# Patient Record
Sex: Female | Born: 1935 | Race: White | Hispanic: No | Marital: Married | Smoking: Former smoker
Health system: Southern US, Community
[De-identification: ages and names within clinical notes are randomized; demographics above are authoritative.]

## PROBLEM LIST (undated history)

## (undated) DIAGNOSIS — Z9289 Personal history of other medical treatment: Secondary | ICD-10-CM

## (undated) DIAGNOSIS — R51 Headache: Secondary | ICD-10-CM

## (undated) DIAGNOSIS — E785 Hyperlipidemia, unspecified: Secondary | ICD-10-CM

## (undated) DIAGNOSIS — I493 Ventricular premature depolarization: Secondary | ICD-10-CM

## (undated) DIAGNOSIS — I2510 Atherosclerotic heart disease of native coronary artery without angina pectoris: Secondary | ICD-10-CM

## (undated) DIAGNOSIS — K219 Gastro-esophageal reflux disease without esophagitis: Secondary | ICD-10-CM

## (undated) DIAGNOSIS — I739 Peripheral vascular disease, unspecified: Secondary | ICD-10-CM

## (undated) DIAGNOSIS — N189 Chronic kidney disease, unspecified: Secondary | ICD-10-CM

## (undated) DIAGNOSIS — R519 Headache, unspecified: Secondary | ICD-10-CM

## (undated) DIAGNOSIS — F0781 Postconcussional syndrome: Secondary | ICD-10-CM

## (undated) DIAGNOSIS — I10 Essential (primary) hypertension: Secondary | ICD-10-CM

## (undated) HISTORY — PX: ROTATOR CUFF REPAIR: SHX139

## (undated) HISTORY — DX: Ventricular premature depolarization: I49.3

## (undated) HISTORY — DX: Postconcussional syndrome: F07.81

## (undated) HISTORY — PX: BREAST CYST EXCISION: SHX579

## (undated) HISTORY — DX: Atherosclerotic heart disease of native coronary artery without angina pectoris: I25.10

## (undated) HISTORY — DX: Hyperlipidemia, unspecified: E78.5

## (undated) HISTORY — DX: Gastro-esophageal reflux disease without esophagitis: K21.9

## (undated) HISTORY — DX: Essential (primary) hypertension: I10

## (undated) HISTORY — DX: Peripheral vascular disease, unspecified: I73.9

---

## 1972-06-29 HISTORY — PX: TUBAL LIGATION: SHX77

## 1997-09-27 ENCOUNTER — Encounter: Admission: RE | Admit: 1997-09-27 | Discharge: 1997-12-26 | Payer: Self-pay | Admitting: Orthopedic Surgery

## 1999-11-10 ENCOUNTER — Other Ambulatory Visit: Admission: RE | Admit: 1999-11-10 | Discharge: 1999-11-10 | Payer: Self-pay | Admitting: Family Medicine

## 2000-11-15 ENCOUNTER — Other Ambulatory Visit: Admission: RE | Admit: 2000-11-15 | Discharge: 2000-11-15 | Payer: Self-pay | Admitting: Family Medicine

## 2001-06-29 HISTORY — PX: GALLBLADDER SURGERY: SHX652

## 2004-04-18 ENCOUNTER — Other Ambulatory Visit: Admission: RE | Admit: 2004-04-18 | Discharge: 2004-04-18 | Payer: Self-pay | Admitting: Family Medicine

## 2006-01-05 ENCOUNTER — Other Ambulatory Visit: Admission: RE | Admit: 2006-01-05 | Discharge: 2006-01-05 | Payer: Self-pay | Admitting: Family Medicine

## 2007-09-27 ENCOUNTER — Encounter: Admission: RE | Admit: 2007-09-27 | Discharge: 2007-11-15 | Payer: Self-pay | Admitting: Family Medicine

## 2008-03-09 ENCOUNTER — Encounter: Payer: Self-pay | Admitting: Internal Medicine

## 2008-12-11 ENCOUNTER — Encounter: Payer: Self-pay | Admitting: Cardiology

## 2009-09-17 ENCOUNTER — Encounter: Payer: Self-pay | Admitting: Cardiology

## 2009-10-22 ENCOUNTER — Encounter: Payer: Self-pay | Admitting: Cardiology

## 2009-10-30 DIAGNOSIS — K219 Gastro-esophageal reflux disease without esophagitis: Secondary | ICD-10-CM | POA: Insufficient documentation

## 2009-10-30 DIAGNOSIS — R072 Precordial pain: Secondary | ICD-10-CM

## 2009-10-31 ENCOUNTER — Ambulatory Visit: Payer: Self-pay | Admitting: Cardiology

## 2009-10-31 ENCOUNTER — Encounter (INDEPENDENT_AMBULATORY_CARE_PROVIDER_SITE_OTHER): Payer: Self-pay | Admitting: *Deleted

## 2009-11-07 ENCOUNTER — Ambulatory Visit: Payer: Self-pay | Admitting: Cardiology

## 2009-11-08 ENCOUNTER — Telehealth (INDEPENDENT_AMBULATORY_CARE_PROVIDER_SITE_OTHER): Payer: Self-pay | Admitting: *Deleted

## 2010-06-20 ENCOUNTER — Encounter
Admission: RE | Admit: 2010-06-20 | Discharge: 2010-06-20 | Payer: Self-pay | Source: Home / Self Care | Attending: Diagnostic Neuroimaging | Admitting: Diagnostic Neuroimaging

## 2010-07-31 NOTE — Assessment & Plan Note (Signed)
Summary: NP-CHEST PAIN   Visit Type:  Initial Consult Primary Provider:  Drue Dun   CC:  chest pain.  History of Present Illness: the patient is a pleasant 75 year old female referred by Dr. Neita Carp for evaluation of chest pain. The patient reports a episode of prolonged substernal chest pain which lasted roughly 10 hours Saturday before Easter. This occurred after she ate a cook ham sandwich with potatoe chips. She experienced a cramping sensation with some radiation between the shoulder blades. There was however no radiation into the neck shoulder or left arm. She experienced the pain as a cramp, definitely not chest pressure. She felt associated nausea no shortness of breath or diaphoresis. The patient has a long-standing history of GERD and notices that when she does not take her Nexium for a few days she will have significant reflux. She has had similar episodes in the past but has taken her to the emergency room. she has had several stress tests the last stress test approximately 2 years ago. She has also been ruled out in the past for microinfarcts with negative enzymes. The EKG that has been forwarded by Dr. Dian Situ office shows no prior infarct patterns or ischemia. Laboratory work also is essentially in normal limits although the patient has hypercholesterolemia and on treatment LDL is 121 with a total cholesterol 222. Liver function tests are within normal limits.  The patient denies any exertional chest pain shortness of breath orthopnea or PND.  Preventive Screening-Counseling & Management  Alcohol-Tobacco     Smoking Status: quit     Year Quit: 1974  Current Problems (verified): 1)  Essential Hypertension  (ICD-401.9) 2)  Gerd  (ICD-530.81) 3)  Precordial Pain  (ICD-786.51)  Current Medications (verified): 1)  Nexium 40 Mg Cpdr (Esomeprazole Magnesium) .... Take 1 Tablet By Mouth Once A Day 2)  Benicar Hct 40-12.5 Mg Tabs (Olmesartan Medoxomil-Hctz) .... Take 1/2 Tablet  By Mouth Once A Day 3)  Zetia 10 Mg Tabs (Ezetimibe) .... Take 1 Tablet By Mouth Once A Day 4)  Lipitor 20 Mg Tabs (Atorvastatin Calcium) .... Take 1 Tablet By Mouth Once A Day 5)  Citracal/vitamin D 250-200 Mg-Unit Tabs (Calcium Citrate-Vitamin D) .... Take 2 Tablet By Mouth Once A Day 6)  Klor-Con 10 10 Meq Cr-Tabs (Potassium Chloride) .... Take One By Mouth Every Other Day 7)  Doxepin Hcl 10 Mg Caps (Doxepin Hcl) .... Take 1 Tablet By Mouth Once A Day At Bedtime As Needed 8)  Ibuprofen 800 Mg Tabs (Ibuprofen) .... Take 1 Tablet By Mouth Two Times A Day As Needed 9)  Centrum Silver  Tabs (Multiple Vitamins-Minerals) .... Take 1 Tablet By Mouth Once A Day 10)  Travatan Z 0.004 % Soln (Travoprost) .... One Drop Ou At Bedtime  Allergies: 1)  ! Pcn 2)  ! Cephalosporins 3)  ! Morphine  Comments:  Nurse/Medical Assistant: The patient's medications and allergies were reviewed with the patient and were updated in the Medication and Allergy Lists. List reviewed.  Past History:  Past Medical History: Last updated: 10/30/2009 CHEST PAINS G E R D  Family History: Reviewed history and no changes required. history of coronary disease in father and mother  Social History: Reviewed history and no changes required. patient is a former Char[employer] fundraiser.she quit smoking in 1974Smoking Status:  quit  Review of Systems       The patient complains of chest pain.  The patient denies fatigue, malaise, fever, weight gain/loss, vision loss, decreased hearing, hoarseness, palpitations, shortness of  breath, prolonged cough, wheezing, sleep apnea, coughing up blood, abdominal pain, blood in stool, nausea, vomiting, diarrhea, heartburn, incontinence, blood in urine, muscle weakness, joint pain, leg swelling, rash, skin lesions, headache, fainting, dizziness, depression, anxiety, enlarged lymph nodes, easy bruising or bleeding, and environmental allergies.    Vital Signs:  Patient profile:   75 year old  female Height:      63.5 inches Weight:      125 pounds BMI:     21.87 Pulse rate:   82 / minute BP sitting:   145 / 72  (left arm) Cuff size:   regular  Vitals Entered By: Carlye Grippe (Oct 31, 2009 1:20 PM) CC: chest pain   Physical Exam  Additional Exam:  General: Well-developed, well-nourished in no distress head: Normocephalic and atraumatic eyes PERRLA/EOMI intact, conjunctiva and lids normal nose: No deformity or lesions mouth normal dentition, normal posterior pharynx neck: Supple, no JVD.  No masses, thyromegaly or abnormal cervical nodes lungs: Normal breath sounds bilaterally without wheezing.  Normal percussion heart: regular rate and rhythm with normal S1 and S2, no S3 or S4.  PMI is normal.  No pathological murmurs abdomen: Normal bowel sounds, abdomen is soft and nontender without masses, organomegaly or hernias noted.  No hepatosplenomegaly musculoskeletal: Back normal, normal gait muscle strength and tone normal pulsus: Pulse is normal in all 4 extremities Extremities: No peripheral pitting edema neurologic: Alert and oriented x 3 skin: Intact without lesions or rashes cervical nodes: No significant adenopathy psychologic: Normal affect    Impression & Recommendations:  Problem # 1:  GERD (ICD-530.81) the patient needs to be compliant with her antireflux measures and should continue to take Nexium on a daily basis. I suspect has esophageal spasm but in the long term could be treated with calcium channel blocker and her antihypertensive medical regimen can be further simplified Her updated medication list for this problem includes:    Nexium 40 Mg Cpdr (Esomeprazole magnesium) .Marland Kitchen... Take 1 tablet by mouth once a day  Problem # 2:  PRECORDIAL PAIN (ICD-786.51)  the patient's chest pain was very prolonged and does not appear to be consistent with angina. She has a long-standing history of reflux disease and I suspect she has esophageal spasm. I told her that  she can take p.r.n. nitroglycerin if this occurs. Her episodes are infrequent enough that she does not need a long acting calcium channel blocker for right now this can be considered in the future however. Although the patient's pretest probability for coronary artery disease is low, I have recommended a stress echocardiogram. If this is within normal limits I do not think any further cardiac workup is warranted.  Orders: Echo- Stress (Stress Echo)  Problem # 3:  ESSENTIAL HYPERTENSION (ICD-401.9) Controlled Her updated medication list for this problem includes:    Benicar Hct 40-12.5 Mg Tabs (Olmesartan medoxomil-hctz) .Marland Kitchen... Take 1/2 tablet by mouth once a day  Patient Instructions: 1)  stress echo  2)  Follow up in  1 month - may cancell if test is okay.

## 2010-07-31 NOTE — Progress Notes (Signed)
Summary: PHONE: TEST RESULTS  Phone Note Call from Patient Call back at Home Phone [phone]   Caller: SELF-CELL # [phone] Reason for Call: Lab or Test Results Summary of Call: Called wanting to know if we have test results of stress echo done 5-12 please call her cell # [phone]  Initial call taken by: Zachary George,  Nov 08, 2009 2:21 PM  Follow-up for Phone Call        11-11-2009  Heidi Wallace called again wanting to know if Dr. Andee Lineman has read stress test. please call [phone] and if no answer please leave a messge. Follow-up by: Zachary George,  Nov 11, 2009 11:16 AM  Additional Follow-up for Phone Call Additional follow up Details #1::        Notified pt. that test is on MD desktop unsigned.  He wiil be back in office on 5/19.  Advised we would call back with results as soon as available.  Patient verbalized understanding.  Hoover Brunette, LPN  Nov 12, 2009 8:56 AM

## 2010-07-31 NOTE — Letter (Signed)
Summary: External Correspondence/ OFFICE NOTE [facility]  External Correspondence/ OFFICE NOTE [facility]   Imported By: Dorise Hiss 10/24/2009 14:52:01  _____________________________________________________________________  External Attachment:    Type:   Image     Comment:   External Document

## 2010-07-31 NOTE — Letter (Signed)
Summary: Stress Echocardiography  [facility] [address]   [place], [place] [address]   Phone: [phone]  Fax: [phone]      [facility] Cardiovascular Services  Stress Echocardiography    Heidi Wallace  Appointment Date:_  Appointment Time:_   Your doctor has ordered a stress echo to help determine the condition of your heart during exercise. If you take blood pressure medication, ask your doctor if you should take it the day of your test. You should not have anything to eat or drink at least 4 hours before your test is scheduled.  You will be asked to undress from the waist up and given a hospital gown to wear, so dress comfortably from the waist down for example: Sweat pants, shorts, or skirt Rubber soled lace up shoes (tennis shoes)  You will need to register at the Outpatient/Main Entrance at the hospital 15 minutes before your appointment time. It is a good idea to bring a copy of your order with you. They will direct you to the Cardiovascular Department on the third floor.   Plan on about an hour and a half  from registration to release from the hospital

## 2010-07-31 NOTE — Letter (Signed)
Summary: Internal Other/ PATIENT HISTORY FORM  Internal Other/ PATIENT HISTORY FORM   Imported By: Dorise Hiss 11/01/2009 16:49:30  _____________________________________________________________________  External Attachment:    Type:   Image     Comment:   External Document

## 2010-10-29 ENCOUNTER — Encounter: Payer: Self-pay | Admitting: Critical Care Medicine

## 2010-10-30 ENCOUNTER — Ambulatory Visit (HOSPITAL_BASED_OUTPATIENT_CLINIC_OR_DEPARTMENT_OTHER)
Admission: RE | Admit: 2010-10-30 | Discharge: 2010-10-30 | Disposition: A | Discharge status: Home / Self Care | Payer: Medicare Other | Source: Ambulatory Visit | Attending: Critical Care Medicine | Admitting: Critical Care Medicine

## 2010-10-30 ENCOUNTER — Encounter: Payer: Self-pay | Admitting: Critical Care Medicine

## 2010-10-30 ENCOUNTER — Ambulatory Visit (INDEPENDENT_AMBULATORY_CARE_PROVIDER_SITE_OTHER): Payer: Medicare Other | Admitting: Critical Care Medicine

## 2010-10-30 DIAGNOSIS — E782 Mixed hyperlipidemia: Secondary | ICD-10-CM | POA: Insufficient documentation

## 2010-10-30 DIAGNOSIS — J984 Other disorders of lung: Secondary | ICD-10-CM

## 2010-10-30 DIAGNOSIS — F0781 Postconcussional syndrome: Secondary | ICD-10-CM

## 2010-10-30 DIAGNOSIS — R911 Solitary pulmonary nodule: Secondary | ICD-10-CM | POA: Insufficient documentation

## 2010-10-30 DIAGNOSIS — E785 Hyperlipidemia, unspecified: Secondary | ICD-10-CM | POA: Insufficient documentation

## 2010-10-30 DIAGNOSIS — I2510 Atherosclerotic heart disease of native coronary artery without angina pectoris: Secondary | ICD-10-CM

## 2010-10-30 DIAGNOSIS — I10 Essential (primary) hypertension: Secondary | ICD-10-CM | POA: Insufficient documentation

## 2010-10-30 DIAGNOSIS — K219 Gastro-esophageal reflux disease without esophagitis: Secondary | ICD-10-CM | POA: Insufficient documentation

## 2010-10-30 DIAGNOSIS — I700 Atherosclerosis of aorta: Secondary | ICD-10-CM

## 2010-10-30 NOTE — Progress Notes (Signed)
Subjective:    Patient ID: Heidi Wallace, female    DOB: [birth year], 75 y.o.   MRN: [id number]  HPI 75 y.o.WF  Referral for RML nodule 5mm new. Adhesions around navel from GB (2003) surgery and tubal ligation (1970s).  CT Abdomen was done and RML nodule seen on scan and is new from 2009.   No real dyspnea.  Pt does have dry cough, ? Reflux.  Always a tickle in the throat.  IF hold head  Up will go away. This is chronic for 32yrs.   Smoked 15yrs 1PPD stopped in 1974.   No hx of pneumonia except congested ?pna 1/12.  No xray done then .   Has GERD symptoms.    Past Medical History  Diagnosis Date  . Chest pain   . GERD (gastroesophageal reflux disease)   . Hypertension   . Hyperlipidemia   . Post concussion syndrome      Family History  Problem Relation Age of Onset  . Heart disease Father   . Heart disease Mother      History   Social History  . Marital Status: Married    Spouse Name: N/A    Number of Children: N/A  . Years of Education: N/A   Occupational History  . former Charity fundraiser    Social History Main Topics  . Smoking status: Former Smoker -- 1.0 packs/day for 16 years    Types: Cigarettes    Quit date: 06/29/1972  . Smokeless tobacco: Never Used  . Alcohol Use: Not on file  . Drug Use: Not on file  . Sexually Active: Not on file   Other Topics Concern  . Not on file   Social History Narrative  . No narrative on file     Allergies  Allergen Reactions  . Cephalosporins     REACTION: rash  . Morphine     REACTION: mother was,so pt ont take it  . Penicillins     REACTION: rash     Outpatient Prescriptions Prior to Visit  Medication Sig Dispense Refill  . atorvastatin (LIPITOR) 20 MG tablet Take 5 mg by mouth every other day.       . Calcium Citrate-Vitamin D (CITRACAL/VITAMIN D) 250-200 MG-UNIT TABS Take 2 tablets by mouth daily.        Marland Kitchen esomeprazole (NEXIUM) 40 MG capsule Take 40 mg by mouth daily before breakfast.        . ezetimibe (ZETIA) 10 MG  tablet Take 10 mg by mouth every other day.       . ibuprofen (ADVIL,MOTRIN) 800 MG tablet Take 800 mg by mouth every 8 (eight) hours as needed.        . Multiple Vitamins-Minerals (CENTRUM SILVER PO) Take 1 tablet by mouth daily.        Marland Kitchen olmesartan-hydrochlorothiazide (BENICAR HCT) 40-12.5 MG per tablet Take 0.5 tablets by mouth daily.        . potassium chloride (KLOR-CON) 10 MEQ CR tablet Take 10 mEq by mouth every other day.       . travoprost, benzalkonium, (TRAVATAN Z) 0.004 % ophthalmic solution Place 1 drop into both eyes at bedtime.        Marland Kitchen doxepin (SINEQUAN) 10 MG capsule Take 10 mg by mouth at bedtime.           Review of Systems  Constitutional: Negative for fever, chills, diaphoresis, activity change, appetite change, fatigue and unexpected weight change.  HENT: Negative for hearing loss, ear pain, nosebleeds,  congestion, sore throat, facial swelling, sneezing, mouth sores, trouble swallowing, neck pain, neck stiffness, dental problem, voice change, postnasal drip, sinus pressure, tinnitus and ear discharge.   Eyes: Negative for photophobia, discharge, itching and visual disturbance.  Respiratory: Negative for apnea, choking, chest tightness, shortness of breath, wheezing and stridor.   Cardiovascular: Negative for chest pain, palpitations and leg swelling.  Gastrointestinal: Negative for nausea, vomiting, abdominal pain, constipation, blood in stool and abdominal distention.  Genitourinary: Negative for dysuria, urgency, frequency, hematuria, flank pain, decreased urine volume and difficulty urinating.  Musculoskeletal: Negative for myalgias, back pain, joint swelling, arthralgias and gait problem.  Skin: Negative for color change, pallor and rash.  Neurological: Negative for dizziness, tremors, seizures, syncope, speech difficulty, weakness, light-headedness, numbness and headaches.  Hematological: Negative for adenopathy. Does not bruise/bleed easily.  Psychiatric/Behavioral:  Negative for confusion, sleep disturbance and agitation. The patient is not nervous/anxious.        Objective:   Physical Exam Filed Vitals:   10/30/10 1519  BP: 120/78  Pulse: 99  Temp: 98.2 F (36.8 C)  TempSrc: Oral  Height: 5\' 3"  (1.6 m)  Weight: 126 lb (57.153 kg)  SpO2: 98%    Gen: Pleasant, well-nourished, in no distress,  normal affect  ENT: No lesions,  mouth clear,  oropharynx clear, no postnasal drip  Neck: No JVD, no TMG, no carotid bruits  Lungs: No use of accessory muscles, no dullness to percussion, clear without rales or rhonchi  Cardiovascular: RRR, heart sounds normal, no murmur or gallops, no peripheral edema  Abdomen: soft and NT, no HSM,  BS normal  Musculoskeletal: No deformities, no cyanosis or clubbing  Neuro: alert, non focal  Skin: Warm, no lesions or rashes     CT Chest 10/31/10 1. Right middle lobe 5 mm pulmonary nodule. Follow-up chest CT in  6-12 months is recommended depending on risk factors.   Assessment & Plan:   Lung nodule 5mm RML non calcified nodule, min smoke exposure No other nodules seen on CT Chest 10/31/10 Plan Repeat CT chest 6 months    Updated Medication List Outpatient Encounter Prescriptions as of 10/30/2010  Medication Sig Dispense Refill  . atorvastatin (LIPITOR) 20 MG tablet Take 5 mg by mouth every other day.       . Calcium Citrate-Vitamin D (CITRACAL/VITAMIN D) 250-200 MG-UNIT TABS Take 2 tablets by mouth daily.        Marland Kitchen esomeprazole (NEXIUM) 40 MG capsule Take 40 mg by mouth daily before breakfast.        . ezetimibe (ZETIA) 10 MG tablet Take 10 mg by mouth every other day.       . gabapentin (NEURONTIN) 300 MG capsule Take 300 mg by mouth 1 dose over 46 hours. At bedtime       . ibuprofen (ADVIL,MOTRIN) 800 MG tablet Take 800 mg by mouth every 8 (eight) hours as needed.        . Multiple Vitamins-Minerals (CENTRUM SILVER PO) Take 1 tablet by mouth daily.        Marland Kitchen olmesartan-hydrochlorothiazide (BENICAR  HCT) 40-12.5 MG per tablet Take 0.5 tablets by mouth daily.        . potassium chloride (KLOR-CON) 10 MEQ CR tablet Take 10 mEq by mouth every other day.       . travoprost, benzalkonium, (TRAVATAN Z) 0.004 % ophthalmic solution Place 1 drop into both eyes at bedtime.        Marland Kitchen DISCONTD: doxepin (SINEQUAN) 10 MG capsule Take 10 mg  by mouth at bedtime.

## 2010-10-30 NOTE — Patient Instructions (Signed)
A CT Chest will be obtained No change in medications I will call with results

## 2010-11-01 NOTE — Assessment & Plan Note (Signed)
5mm RML non calcified nodule, min smoke exposure No other nodules seen on CT Chest 10/31/10 Plan Repeat CT chest 6 months

## 2011-04-20 ENCOUNTER — Telehealth: Payer: Self-pay | Admitting: Critical Care Medicine

## 2011-04-20 DIAGNOSIS — R911 Solitary pulmonary nodule: Secondary | ICD-10-CM

## 2011-04-20 NOTE — Telephone Encounter (Signed)
Yes, pls sched ct chest non contrast before next ov

## 2011-04-20 NOTE — Telephone Encounter (Signed)
Pt returned [name]'s call [phone].  Heidi Wallace

## 2011-04-20 NOTE — Telephone Encounter (Signed)
Pt last saw PW 10/30/10 and had CT done same day.  In OV note from 10/30/10, PW documented will need f/u CT scan in 6 months for 5mm RML pulm nodule.   Pt scheduled to see PW on 04/30/11.  PW, do you want pt to have CT prior to appt with you?  Please advise.   Thanks.

## 2011-04-20 NOTE — Telephone Encounter (Signed)
Called and spoke with pt.  Informed her of below information and that I would check with PW regarding scheduling CT.  Pt aware PW out of office this week and will return next week and stated she is ok to wait until next week for an answer.

## 2011-04-20 NOTE — Telephone Encounter (Signed)
Spoke with pt and advised [facility] will contact her to sched CT prior to appt. Order was sent to [facility].

## 2011-04-28 ENCOUNTER — Ambulatory Visit (INDEPENDENT_AMBULATORY_CARE_PROVIDER_SITE_OTHER)
Admission: RE | Admit: 2011-04-28 | Discharge: 2011-04-28 | Disposition: A | Discharge status: Home / Self Care | Payer: Medicare Other | Source: Ambulatory Visit | Attending: Critical Care Medicine | Admitting: Critical Care Medicine

## 2011-04-28 DIAGNOSIS — J984 Other disorders of lung: Secondary | ICD-10-CM

## 2011-04-28 DIAGNOSIS — R911 Solitary pulmonary nodule: Secondary | ICD-10-CM

## 2011-04-29 ENCOUNTER — Telehealth: Payer: Self-pay | Admitting: Critical Care Medicine

## 2011-04-29 DIAGNOSIS — R911 Solitary pulmonary nodule: Secondary | ICD-10-CM

## 2011-04-29 NOTE — Progress Notes (Signed)
Quick Note:  Appt has already been cancelled for 04/30/11 -- pt has no pending appt at this time with Dr. Delford Field. ______

## 2011-04-29 NOTE — Telephone Encounter (Signed)
CT chest shows resolution of lung nodules, no further scans needed  Pt is aware .

## 2011-04-30 ENCOUNTER — Ambulatory Visit: Payer: Medicare Other | Admitting: Critical Care Medicine

## 2011-07-31 HISTORY — PX: CARDIAC CATHETERIZATION: SHX172

## 2011-08-12 DIAGNOSIS — H40009 Preglaucoma, unspecified, unspecified eye: Secondary | ICD-10-CM | POA: Diagnosis not present

## 2011-08-14 DIAGNOSIS — M79609 Pain in unspecified limb: Secondary | ICD-10-CM | POA: Diagnosis not present

## 2011-08-14 DIAGNOSIS — K219 Gastro-esophageal reflux disease without esophagitis: Secondary | ICD-10-CM | POA: Diagnosis not present

## 2011-08-14 DIAGNOSIS — R072 Precordial pain: Secondary | ICD-10-CM | POA: Diagnosis not present

## 2011-08-14 DIAGNOSIS — Z79899 Other long term (current) drug therapy: Secondary | ICD-10-CM | POA: Diagnosis not present

## 2011-08-14 DIAGNOSIS — Z8249 Family history of ischemic heart disease and other diseases of the circulatory system: Secondary | ICD-10-CM | POA: Diagnosis not present

## 2011-08-14 DIAGNOSIS — I10 Essential (primary) hypertension: Secondary | ICD-10-CM | POA: Diagnosis not present

## 2011-08-14 DIAGNOSIS — R079 Chest pain, unspecified: Secondary | ICD-10-CM | POA: Diagnosis not present

## 2011-08-14 DIAGNOSIS — E785 Hyperlipidemia, unspecified: Secondary | ICD-10-CM | POA: Diagnosis not present

## 2011-08-14 DIAGNOSIS — R0789 Other chest pain: Secondary | ICD-10-CM | POA: Diagnosis not present

## 2011-08-14 DIAGNOSIS — R1013 Epigastric pain: Secondary | ICD-10-CM | POA: Diagnosis not present

## 2011-08-15 DIAGNOSIS — R0789 Other chest pain: Secondary | ICD-10-CM | POA: Diagnosis not present

## 2011-08-19 ENCOUNTER — Telehealth: Payer: Self-pay | Admitting: *Deleted

## 2011-08-19 NOTE — Telephone Encounter (Signed)
Pt left message on voicemail asking for a return call. She states she is scheduled for a stress test on Friday. She has an appt with Dr. Kirke Corin in a few weeks. She wants to know if appt should be before stress test.   Left message to call back on voicemail of cell phone. No answer at home number. (I am assuming stress test was ordered at d/c from [facility].)

## 2011-08-19 NOTE — Telephone Encounter (Signed)
Stress test was ordered at hosp d/c by Dr. Leandrew Koyanagi or Neita Carp. Pt will have this done and see Dr. Kirke Corin as planned.

## 2011-08-21 DIAGNOSIS — I498 Other specified cardiac arrhythmias: Secondary | ICD-10-CM | POA: Diagnosis not present

## 2011-08-21 DIAGNOSIS — R9439 Abnormal result of other cardiovascular function study: Secondary | ICD-10-CM | POA: Diagnosis not present

## 2011-08-21 DIAGNOSIS — I2589 Other forms of chronic ischemic heart disease: Secondary | ICD-10-CM | POA: Diagnosis not present

## 2011-08-21 DIAGNOSIS — R079 Chest pain, unspecified: Secondary | ICD-10-CM | POA: Diagnosis not present

## 2011-08-24 ENCOUNTER — Encounter: Payer: Self-pay | Admitting: *Deleted

## 2011-08-24 ENCOUNTER — Ambulatory Visit (INDEPENDENT_AMBULATORY_CARE_PROVIDER_SITE_OTHER): Payer: Medicare Other | Admitting: Cardiovascular Disease

## 2011-08-24 ENCOUNTER — Encounter: Payer: Self-pay | Admitting: Cardiovascular Disease

## 2011-08-24 ENCOUNTER — Telehealth: Payer: Self-pay | Admitting: *Deleted

## 2011-08-24 DIAGNOSIS — R079 Chest pain, unspecified: Secondary | ICD-10-CM | POA: Diagnosis not present

## 2011-08-24 DIAGNOSIS — R0602 Shortness of breath: Secondary | ICD-10-CM | POA: Diagnosis not present

## 2011-08-24 DIAGNOSIS — I4949 Other premature depolarization: Secondary | ICD-10-CM | POA: Diagnosis not present

## 2011-08-24 DIAGNOSIS — I359 Nonrheumatic aortic valve disorder, unspecified: Secondary | ICD-10-CM

## 2011-08-24 DIAGNOSIS — Z01810 Encounter for preprocedural cardiovascular examination: Secondary | ICD-10-CM | POA: Diagnosis not present

## 2011-08-24 DIAGNOSIS — I493 Ventricular premature depolarization: Secondary | ICD-10-CM

## 2011-08-24 NOTE — Telephone Encounter (Signed)
Pt has Medicare and BCBS Medicare supplement.  No precert required °

## 2011-08-24 NOTE — Telephone Encounter (Signed)
L heart cath on 08/26/11 with Kirke Corin

## 2011-08-24 NOTE — Progress Notes (Signed)
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 She has prolonged history of frequent PVCs with multiple negative stress testing since the 90s.  She was hospitalized recently at [facility]. She presented on February 15 with left arm discomfort which lasted for about 30 minutes and was described as aching and numbness feeling. Shortly after that she  Started having substernal chest aching sensation and cramping which lasted for about 30 minutes. This happened at rest. She was hospitalized overnight. She ruled out for myocardial infarction. Her ECG did not show any acute changes. She was scheduled for an outpatient nuclear stress test. Since her hospital discharge, she had few episodes of chest pain which did not last as long. She has cut down her physical activities. She does complain of exertional fatigue especially going up one flight of stairs without significant chest pain. She does get dyspnea.  She does not have family history of premature coronary artery disease. However, both parents died of myocardial infarction. Her mother died at age of 79 and father at the age of 83. She has 3 cousins that had postoperative pulmonary embolism. 2 of them were fatal. The most recent one was at 49-year-old cousin who had any replacement and died suddenly after the operation.  she has a history of pulmonary nodules which were followed by Dr. Wright. Her most recent CT scan showed complete resolution.  Allergies  Allergen Reactions  . Cephalosporins     REACTION: rash  . Morphine     REACTION: mother was,so pt ont take it  . Penicillins     REACTION: rash     Current Outpatient Prescriptions on File Prior to Visit  Medication Sig Dispense Refill  . atorvastatin (LIPITOR) 20 MG tablet Take 5 mg by mouth every other day.       . Calcium Citrate-Vitamin D (CITRACAL/VITAMIN D) 250-200 MG-UNIT TABS Take 2 tablets by mouth daily.         . esomeprazole (NEXIUM) 40 MG capsule Take 40 mg by mouth daily before breakfast.        . gabapentin (NEURONTIN) 300 MG capsule Take 300 mg by mouth 1 dose over 46 hours. At bedtime       . ibuprofen (ADVIL,MOTRIN) 800 MG tablet Take 800 mg by mouth every 8 (eight) hours as needed.        . Multiple Vitamins-Minerals (CENTRUM SILVER PO) Take 1 tablet by mouth daily.        . olmesartan-hydrochlorothiazide (BENICAR HCT) 40-12.5 MG per tablet Take 0.5 tablets by mouth daily.        . travoprost, benzalkonium, (TRAVATAN Z) 0.004 % ophthalmic solution Place 1 drop into both eyes at bedtime.           Past Medical History  Diagnosis Date  . Chest pain   . GERD (gastroesophageal reflux disease)   . Hypertension   . Hyperlipidemia   . Post concussion syndrome   . PVC's (premature ventricular contractions)      Past Surgical History  Procedure Date  . Gallbladder surgery 2003  . Tubal ligation 1974  . Breast cyst excision benign right breast    1982  . Rotator cuff repair right 1989     Family History  Problem Relation Age of Onset  . Heart disease Father   . Heart disease Mother      History   Social History  . Marital   Status: Married    Spouse Name: N/A    Number of Children: N/A  . Years of Education: N/A   Occupational History  . former RN    Social History Main Topics  . Smoking status: Former Smoker -- 1.0 packs/day for 16 years    Types: Cigarettes    Quit date: 06/29/1972  . Smokeless tobacco: Never Used  . Alcohol Use: Not on file  . Drug Use: Not on file  . Sexually Active: Not on file   Other Topics Concern  . Not on file   Social History Narrative  . No narrative on file     ROS Constitutional: Negative for fever, chills, diaphoresis, activity change, appetite change. HENT: Negative for hearing loss, nosebleeds, congestion, sore throat, facial swelling, drooling, trouble swallowing, neck pain, voice change, sinus pressure and tinnitus.    Eyes: Negative for photophobia, pain, discharge and visual disturbance.  Respiratory: Negative for apnea, cough and wheezing.  Cardiovascular: Negative for  leg swelling.  Gastrointestinal: Negative for nausea, vomiting, abdominal pain, diarrhea, constipation, blood in stool and abdominal distention.  Genitourinary: Negative for dysuria, urgency, frequency, hematuria and decreased urine volume.  Musculoskeletal: Negative for myalgias, back pain, joint swelling, arthralgias and gait problem.  Skin: Negative for color change, pallor, rash and wound.  Neurological: Negative for dizziness, tremors, seizures, syncope, speech difficulty, weakness, light-headedness, numbness and headaches.  Psychiatric/Behavioral: Negative for suicidal ideas, hallucinations, behavioral problems and agitation. The patient is not nervous/anxious.     PHYSICAL EXAM   BP 123/80  Pulse 70  Resp 18  Ht 5' 3" (1.6 m)  Wt 121 lb 1.9 oz (54.94 kg)  BMI 21.46 kg/m2  Constitutional: She is oriented to person, place, and time. She appears well-developed and well-nourished. No distress.  HENT: No nasal discharge.  Head: Normocephalic and atraumatic.  Eyes: Pupils are equal and round. Right eye exhibits no discharge. Left eye exhibits no discharge.  Neck: Normal range of motion. Neck supple. No JVD present. No thyromegaly present.  Cardiovascular: Normal rate, regular rhythm with frequent premature beats, normal heart sounds. Exam reveals no gallop and no friction rub. No murmur heard.  Pulmonary/Chest: Effort normal and breath sounds normal. No stridor. No respiratory distress. She has no wheezes. She has no rales. She exhibits no tenderness.  Abdominal: Soft. Bowel sounds are normal. She exhibits no distension. There is no tenderness. There is no rebound and no guarding.  Musculoskeletal: Normal range of motion. She exhibits no edema and no tenderness.  Neurological: She is alert and oriented to person, place, and  time. Coordination normal.  Skin: Skin is warm and dry. No rash noted. She is not diaphoretic. No erythema. No pallor.  Psychiatric: She has a normal mood and affect. Her behavior is normal. Judgment and thought content normal.     EKG:  Normal sinus rhythm with PVCs. No significant ST or T wave changes. Incomplete right bundle branch block.   ASSESSMENT AND PLAN   

## 2011-08-24 NOTE — Assessment & Plan Note (Signed)
Patient had a recent episode of left arm and chest discomfort. Her symptoms are overall atypical. However, she does report progressive exertional dyspnea and fatigue. She underwent a nuclear stress test which showed evidence of mild medium size anteroseptal ischemia with normal ejection fraction. She was noted to be in ventricular bigeminy at baseline ECG. Her PVCs disappeared with exercise.  Due to her symptoms and abnormal stress test, I recommend proceeding with cardiac catheterization and possible coronary intervention. Risks, benefits and alternatives were discussed with the patient.  Continue aspirin for now. If the patient is admitted to the hospital after the procedure, she will need aggressive DVT prophylaxis due to her family history.

## 2011-08-24 NOTE — Assessment & Plan Note (Signed)
The patient has prolonged history of PVCs. It seems that these might have become more frequent recently. Treatment of this will depend on the findings of cardiac catheterization.  I will consider treatment with a beta blocker or calcium channel blocker even if she does not have significant coronary artery disease on cardiac catheterization. Frequent PVCs to this degree might ultimately lead to LV systolic dysfunction.

## 2011-08-24 NOTE — Patient Instructions (Addendum)
Your physician has requested that you have a cardiac catheterization. Cardiac catheterization is used to diagnose and/or treat various heart conditions. Doctors may recommend this procedure for a number of different reasons. The most common reason is to evaluate chest pain. Chest pain can be a symptom of coronary artery disease (CAD), and cardiac catheterization can show whether plaque is narrowing or blocking your heart's arteries. This procedure is also used to evaluate the valves, as well as measure the blood flow and oxygen levels in different parts of your heart. For further information please visit [email or web address] Please follow instruction sheet, as given.  Your physician recommends that you return for lab work in: TODAY AT THE [facility]   Your physician recommends that you continue on your current medications as directed. Please refer to the Current Medication list given to you today.

## 2011-08-26 ENCOUNTER — Encounter: Payer: Self-pay | Admitting: Cardiovascular Disease

## 2011-08-26 ENCOUNTER — Encounter (HOSPITAL_BASED_OUTPATIENT_CLINIC_OR_DEPARTMENT_OTHER)
Admission: RE | Disposition: A | Discharge status: Home / Self Care | Payer: Self-pay | Source: Ambulatory Visit | Attending: Cardiovascular Disease

## 2011-08-26 ENCOUNTER — Encounter (HOSPITAL_BASED_OUTPATIENT_CLINIC_OR_DEPARTMENT_OTHER): Payer: Self-pay | Admitting: *Deleted

## 2011-08-26 ENCOUNTER — Inpatient Hospital Stay (HOSPITAL_BASED_OUTPATIENT_CLINIC_OR_DEPARTMENT_OTHER)
Admission: RE | Admit: 2011-08-26 | Discharge: 2011-08-26 | Disposition: A | Discharge status: Home / Self Care | Payer: Medicare Other | Source: Ambulatory Visit | Attending: Cardiovascular Disease | Admitting: Cardiovascular Disease

## 2011-08-26 DIAGNOSIS — I2510 Atherosclerotic heart disease of native coronary artery without angina pectoris: Secondary | ICD-10-CM | POA: Insufficient documentation

## 2011-08-26 SURGERY — JV LEFT HEART CATHETERIZATION WITH CORONARY ANGIOGRAM
Anesthesia: Moderate Sedation

## 2011-08-26 MED ORDER — SODIUM CHLORIDE 0.9 % IV SOLN
INTRAVENOUS | Status: DC
  Administered 2011-08-26: 07:00:00 via INTRAVENOUS

## 2011-08-26 MED ORDER — SODIUM CHLORIDE 0.9 % IV SOLN: 250.0000 mL | INTRAVENOUS | Status: DC | PRN

## 2011-08-26 MED ORDER — ASPIRIN 81 MG PO CHEW: 324.0000 mg | CHEWABLE_TABLET | ORAL | Status: DC

## 2011-08-26 MED ORDER — METOPROLOL TARTRATE 25 MG PO TABS: 25.0000 mg | ORAL_TABLET | Freq: Two times a day (BID) | ORAL | Status: DC

## 2011-08-26 MED ORDER — SODIUM CHLORIDE 0.9 % IV SOLN: INTRAVENOUS | Status: AC

## 2011-08-26 MED ORDER — SODIUM CHLORIDE 0.9 % IJ SOLN: 3.0000 mL | Freq: Two times a day (BID) | INTRAMUSCULAR | Status: DC

## 2011-08-26 MED ORDER — SODIUM CHLORIDE 0.9 % IJ SOLN: 3.0000 mL | INTRAMUSCULAR | Status: DC | PRN

## 2011-08-26 MED ORDER — ACETAMINOPHEN 325 MG PO TABS: 650.0000 mg | ORAL_TABLET | ORAL | Status: DC | PRN

## 2011-08-26 MED ORDER — DIAZEPAM 5 MG PO TABS
5.0000 mg | ORAL_TABLET | ORAL | Status: AC
  Administered 2011-08-26: 5 mg via ORAL

## 2011-08-26 NOTE — OR Nursing (Signed)
Tegaderm dressing applied, site level 0, bedrest begins at 0835 

## 2011-08-26 NOTE — OR Nursing (Signed)
Dr Arida at bedside to discuss results and treatment plan with pt and family 

## 2011-08-26 NOTE — Progress Notes (Signed)
Discharge instructions completed, ambulated to bathroom without bleeding from right groin site, discharged to home via wheelchair with husband. 

## 2011-08-26 NOTE — Interval H&P Note (Signed)
History and Physical Interval Note:  08/26/2011 7:41 AM  Heidi Wallace  has presented today for surgery, with the diagnosis of Chest pain  The various methods of treatment have been discussed with the patient and family. After consideration of risks, benefits and other options for treatment, the patient has consented to  Procedure(s) (LRB): JV LEFT HEART CATHETERIZATION WITH CORONARY ANGIOGRAM (N/A) as a surgical intervention .  The patients' history has been reviewed, patient examined, no change in status, stable for surgery.  I have reviewed the patients' chart and labs.  Questions were answered to the patient's satisfaction.     Lorine Bears

## 2011-08-26 NOTE — H&P (View-Only) (Signed)
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 She has prolonged history of frequent PVCs with multiple negative stress testing since the 90s.  She was hospitalized recently at [facility]. She presented on February 15 with left arm discomfort which lasted for about 30 minutes and was described as aching and numbness feeling. Shortly after that she  Started having substernal chest aching sensation and cramping which lasted for about 30 minutes. This happened at rest. She was hospitalized overnight. She ruled out for myocardial infarction. Her ECG did not show any acute changes. She was scheduled for an outpatient nuclear stress test. Since her hospital discharge, she had few episodes of chest pain which did not last as long. She has cut down her physical activities. She does complain of exertional fatigue especially going up one flight of stairs without significant chest pain. She does get dyspnea.  She does not have family history of premature coronary artery disease. However, both parents died of myocardial infarction. Her mother died at age of 75 and father at the age of 63. She has 3 cousins that had postoperative pulmonary embolism. 2 of them were fatal. The most recent one was at 22 year old cousin who had any replacement and died suddenly after the operation.  she has a history of pulmonary nodules which were followed by Dr. Delford Field. Her most recent CT scan showed complete resolution.  Allergies  Allergen Reactions  . Cephalosporins     REACTION: rash  . Morphine     REACTION: mother was,so pt ont take it  . Penicillins     REACTION: rash     Current Outpatient Prescriptions on File Prior to Visit  Medication Sig Dispense Refill  . atorvastatin (LIPITOR) 20 MG tablet Take 5 mg by mouth every other day.       . Calcium Citrate-Vitamin D (CITRACAL/VITAMIN D) 250-200 MG-UNIT TABS Take 2 tablets by mouth daily.         Marland Kitchen esomeprazole (NEXIUM) 40 MG capsule Take 40 mg by mouth daily before breakfast.        . gabapentin (NEURONTIN) 300 MG capsule Take 300 mg by mouth 1 dose over 46 hours. At bedtime       . ibuprofen (ADVIL,MOTRIN) 800 MG tablet Take 800 mg by mouth every 8 (eight) hours as needed.        . Multiple Vitamins-Minerals (CENTRUM SILVER PO) Take 1 tablet by mouth daily.        Marland Kitchen olmesartan-hydrochlorothiazide (BENICAR HCT) 40-12.5 MG per tablet Take 0.5 tablets by mouth daily.        . travoprost, benzalkonium, (TRAVATAN Z) 0.004 % ophthalmic solution Place 1 drop into both eyes at bedtime.           Past Medical History  Diagnosis Date  . Chest pain   . GERD (gastroesophageal reflux disease)   . Hypertension   . Hyperlipidemia   . Post concussion syndrome   . PVC's (premature ventricular contractions)      Past Surgical History  Procedure Date  . Gallbladder surgery 2003  . Tubal ligation 1974  . Breast cyst excision benign right breast    1982  . Rotator cuff repair right 1989     Family History  Problem Relation Age of Onset  . Heart disease Father   . Heart disease Mother      History   Social History  . Marital  Status: Married    Spouse Name: N/A    Number of Children: N/A  . Years of Education: N/A   Occupational History  . former Charity fundraiser    Social History Main Topics  . Smoking status: Former Smoker -- 1.0 packs/day for 16 years    Types: Cigarettes    Quit date: 06/29/1972  . Smokeless tobacco: Never Used  . Alcohol Use: Not on file  . Drug Use: Not on file  . Sexually Active: Not on file   Other Topics Concern  . Not on file   Social History Narrative  . No narrative on file     ROS Constitutional: Negative for fever, chills, diaphoresis, activity change, appetite change. HENT: Negative for hearing loss, nosebleeds, congestion, sore throat, facial swelling, drooling, trouble swallowing, neck pain, voice change, sinus pressure and tinnitus.    Eyes: Negative for photophobia, pain, discharge and visual disturbance.  Respiratory: Negative for apnea, cough and wheezing.  Cardiovascular: Negative for  leg swelling.  Gastrointestinal: Negative for nausea, vomiting, abdominal pain, diarrhea, constipation, blood in stool and abdominal distention.  Genitourinary: Negative for dysuria, urgency, frequency, hematuria and decreased urine volume.  Musculoskeletal: Negative for myalgias, back pain, joint swelling, arthralgias and gait problem.  Skin: Negative for color change, pallor, rash and wound.  Neurological: Negative for dizziness, tremors, seizures, syncope, speech difficulty, weakness, light-headedness, numbness and headaches.  Psychiatric/Behavioral: Negative for suicidal ideas, hallucinations, behavioral problems and agitation. The patient is not nervous/anxious.     PHYSICAL EXAM   BP 123/80  Pulse 70  Resp 18  Ht 5\' 3"  (1.6 m)  Wt 121 lb 1.9 oz (54.94 kg)  BMI 21.46 kg/m2  Constitutional: She is oriented to person, place, and time. She appears well-developed and well-nourished. No distress.  HENT: No nasal discharge.  Head: Normocephalic and atraumatic.  Eyes: Pupils are equal and round. Right eye exhibits no discharge. Left eye exhibits no discharge.  Neck: Normal range of motion. Neck supple. No JVD present. No thyromegaly present.  Cardiovascular: Normal rate, regular rhythm with frequent premature beats, normal heart sounds. Exam reveals no gallop and no friction rub. No murmur heard.  Pulmonary/Chest: Effort normal and breath sounds normal. No stridor. No respiratory distress. She has no wheezes. She has no rales. She exhibits no tenderness.  Abdominal: Soft. Bowel sounds are normal. She exhibits no distension. There is no tenderness. There is no rebound and no guarding.  Musculoskeletal: Normal range of motion. She exhibits no edema and no tenderness.  Neurological: She is alert and oriented to person, place, and  time. Coordination normal.  Skin: Skin is warm and dry. No rash noted. She is not diaphoretic. No erythema. No pallor.  Psychiatric: She has a normal mood and affect. Her behavior is normal. Judgment and thought content normal.     EKG:  Normal sinus rhythm with PVCs. No significant ST or T wave changes. Incomplete right bundle branch block.   ASSESSMENT AND PLAN

## 2011-08-26 NOTE — OR Nursing (Signed)
Meal served 

## 2011-08-26 NOTE — Op Note (Signed)
Cardiac Catheterization Procedure Note  Name: Heidi Wallace MRN: [id number] DOB: Mar 31, 1936  Procedure: Left Heart Cath, Selective Coronary Angiography, LV angiography  Indication: Chest pain with abnormal stress test and frequent PVCs.   Medications:  Sedation:  1 mg IV Versed, 25 mcg IV Fentanyl  Contrast:  75 mL Omnipaque  Procedural details: The right groin was prepped, draped, and anesthetized with 1% lidocaine. Using modified Seldinger technique, a 4 French sheath was introduced into the right femoral artery. Standard Judkins catheters were used for coronary angiography and left ventriculography. The RCA had an anterior takeoff and was nonselectively engaged with an AR-1 catheter. Catheter exchanges were performed over a guidewire. There were no immediate procedural complications. The patient was transferred to the post catheterization recovery area for further monitoring.   Procedural Findings:  Hemodynamics: AO:  138/72   mmHg LV:  135/6    mmHg LVEDP: 11  mmHg  Coronary angiography: Coronary dominance: Codominant.   Left Main:  Normal in size and shows. If this moderately calcified without evidence of obstructive disease.  Left Anterior Descending (LAD):  The vessel is heavily calcified in the proximal and midsegment. There is a diffuse 40% stenosis proximally. The mid segment has 20% diffuse disease. There is a 30% tubular stenosis distally.  1st diagonal (D1):  Small in size with mild diffuse atherosclerosis.  2nd diagonal (D2):  Medium in size with minor irregularities.  3rd diagonal (D3):  Small in size.  Circumflex (LCx):  The vessel is normal in size and codominant. The vessel has minor irregularities throughout its course without evidence of active disease.  1st obtuse marginal:  Medium in size with 40% ostial disease.  2nd obtuse marginal:  Medium in size with 40-50% proximal disease.  3rd obtuse marginal:  Medium in size with minor  irregularities.   Right Coronary Artery: The vessel has an anterior downward takeoff. It is heavily calcified. It was nonselectively engaged with an AR-1 catheter. It has minor irregularities without evidence of obstructive disease.  posterior descending artery: Small in size.  posterior lateral branch:  Normal in size with minor irregularities. Left ventriculography: Left ventricular systolic function is normal , LVEF is estimated at 55 %, there is no significant mitral regurgitation . The ascending aorta appears to be mildly dilated.  Final Conclusions:   1. Heavily calcified coronary arteries with moderate nonobstructive coronary artery disease. 2. Normal LV systolic function and filling pressures. 3. Mildly dilated descending aorta.   Recommendations: I recommend aggressive medical therapy. More aggressive treatment of hyperlipidemia might be appropriate with a target LDL of less than 70. Due to her frequent PVCs, I will start her on a beta blocker.  Lorine Bears MD, [facility] 08/26/2011, 8:17 AM

## 2011-08-27 ENCOUNTER — Telehealth: Payer: Self-pay | Admitting: *Deleted

## 2011-08-27 NOTE — Telephone Encounter (Signed)
Pt states she woke up around 3 am with pain in her lower abdomen near where cath would have been inserted but it is not in the groin. Pain is in the lower abd. She states pain was 6-7/10. She took Valium 2.5 mg around 7 am and it eased some at that point. She denies any bleeding through the bandage or other problems other than the pain. She states she has not removed the bandage at this time. Pt is aware to proceed to the ER for worsening pain or for swelling, redness, drainage. Otherwise, pt is aware note will be sent to Dr. Kirke Corin for further review and recommendation.

## 2011-08-31 ENCOUNTER — Encounter: Payer: Medicare Other | Admitting: Cardiovascular Disease

## 2011-09-11 ENCOUNTER — Ambulatory Visit (INDEPENDENT_AMBULATORY_CARE_PROVIDER_SITE_OTHER): Payer: Medicare Other | Admitting: Cardiovascular Disease

## 2011-09-11 ENCOUNTER — Encounter: Payer: Self-pay | Admitting: Cardiovascular Disease

## 2011-09-11 VITALS — BP 113/69 | HR 71 | Ht 63.0 in | Wt 123.0 lb

## 2011-09-11 DIAGNOSIS — I4949 Other premature depolarization: Secondary | ICD-10-CM | POA: Diagnosis not present

## 2011-09-11 DIAGNOSIS — I2510 Atherosclerotic heart disease of native coronary artery without angina pectoris: Secondary | ICD-10-CM | POA: Diagnosis not present

## 2011-09-11 DIAGNOSIS — E785 Hyperlipidemia, unspecified: Secondary | ICD-10-CM

## 2011-09-11 DIAGNOSIS — I493 Ventricular premature depolarization: Secondary | ICD-10-CM

## 2011-09-11 NOTE — Progress Notes (Signed)
HPI  This is a 76 year old female who is here today for a followup visit. I saw her recently for chest pain and an abnormal nuclear stress test. She was noted to have significant PVCs with ventricular bigeminy. She underwent cardiac catheterization which showed mild to moderate but nonobstructive coronary artery disease with heavy calcifications. Ejection fraction was normal. I started her on metoprolol 25 mg twice daily. Since then, she had significant improvement in her symptoms. Her dyspnea has improved as well as palpitations. She continues to have brief superficial chest pain which doesn't last for a long time.   Allergies  Allergen Reactions  . Cephalosporins     REACTION: rash  . Morphine     REACTION: mother was,so pt ont take it  . Penicillins     REACTION: rash     Current Outpatient Prescriptions on File Prior to Visit  Medication Sig Dispense Refill  . aspirin 81 MG tablet Take 81 mg by mouth daily.      Marland Kitchen atorvastatin (LIPITOR) 20 MG tablet Take 10 mg by mouth daily.       Marland Kitchen esomeprazole (NEXIUM) 40 MG capsule Take 40 mg by mouth daily before breakfast.        . gabapentin (NEURONTIN) 300 MG capsule Take 300 mg by mouth daily. At bedtime      . metoprolol tartrate (LOPRESSOR) 25 MG tablet Take 1 tablet (25 mg total) by mouth 2 (two) times daily.  60 tablet  6  . olmesartan-hydrochlorothiazide (BENICAR HCT) 40-12.5 MG per tablet Take 0.5 tablets by mouth daily.        . travoprost, benzalkonium, (TRAVATAN Z) 0.004 % ophthalmic solution Place 1 drop into both eyes at bedtime.        . Wheat Dextrin (BENEFIBER DRINK MIX PO) Take 1 Package by mouth daily.         Past Medical History  Diagnosis Date  . Chest pain   . GERD (gastroesophageal reflux disease)   . Hypertension   . Post concussion syndrome   . Coronary artery disease     cardiac cath 08/2011: veavily calcified coronary arteries without obstructive disease. 40% proximal LAD, 40% in OMs.   . PVC's (premature  ventricular contractions)   . Hyperlipidemia      Past Surgical History  Procedure Date  . Gallbladder surgery 2003  . Tubal ligation 1974  . Breast cyst excision benign right breast    1982  . Rotator cuff repair right 1989  . Cardiac catheterization      Family History  Problem Relation Age of Onset  . Heart disease Father   . Heart disease Mother      History   Social History  . Marital Status: Married    Spouse Name: N/A    Number of Children: N/A  . Years of Education: N/A   Occupational History  . former Charity fundraiser    Social History Main Topics  . Smoking status: Former Smoker -- 1.0 packs/day for 16 years    Types: Cigarettes    Quit date: 06/29/1972  . Smokeless tobacco: Never Used  . Alcohol Use: Not on file  . Drug Use: Not on file  . Sexually Active: Not on file   Other Topics Concern  . Not on file   Social History Narrative  . No narrative on file     PHYSICAL EXAM   BP 113/69  Pulse 71  Ht 5\' 3"  (1.6 m)  Wt 123 lb (55.792  kg)  BMI 21.79 kg/m2 Constitutional: She is oriented to person, place, and time. She appears well-developed and well-nourished. No distress.  HENT: No nasal discharge.  Head: Normocephalic and atraumatic.  Eyes: Pupils are equal and round. Right eye exhibits no discharge. Left eye exhibits no discharge.  Neck: Normal range of motion. Neck supple. No JVD present. No thyromegaly present.  Cardiovascular: Normal rate, regular rhythm, normal heart sounds. Exam reveals no gallop and no friction rub. No murmur heard.  Pulmonary/Chest: Effort normal and breath sounds normal. No stridor. No respiratory distress. She has no wheezes. She has no rales. She exhibits no tenderness.  Abdominal: Soft. Bowel sounds are normal. She exhibits no distension. There is no tenderness. There is no rebound and no guarding.  Musculoskeletal: Normal range of motion. She exhibits no edema and no tenderness.  Neurological: She is alert and oriented to  person, place, and time. Coordination normal.  Skin: Skin is warm and dry. No rash noted. She is not diaphoretic. No erythema. No pallor.  Psychiatric: She has a normal mood and affect. Her behavior is normal. Judgment and thought content normal.  Right groin: No ecchymosis, hematoma or bruit.    ASSESSMENT AND PLAN

## 2011-09-11 NOTE — Assessment & Plan Note (Signed)
I believe that many of the patient's symptoms of dyspnea palpitations were due to frequent PVCs. These have improved significantly with the addition of metoprolol which will be continued.

## 2011-09-11 NOTE — Assessment & Plan Note (Signed)
Due to the findings on cardiac catheterization, I recommend a target LDL of less than 70. She does have history of myalgia with statins. She will be discussing this further with Dr. Neita Carp her primary care physician. One consideration would be to increase her atorvastatin or switch to Crestor.

## 2011-09-11 NOTE — Assessment & Plan Note (Signed)
The patient does have mild to moderate coronary artery disease with extensive atherosclerosis. No revascularization is needed at this time. However, I do recommend aggressive management of her risk factors in order to slow down  progression of disease. We discussed the importance of diet and exercise. Continue aspirin 81 mg once daily.

## 2011-09-11 NOTE — Patient Instructions (Addendum)
Your physician you to follow up in 1 year. You will receive a reminder letter in the mail one-two months in advance. If you don't receive a letter, please call our office to schedule the follow-up appointment. Your physician recommends that you continue on your current medications as directed. Please refer to the Current Medication list given to you today. 

## 2011-09-15 ENCOUNTER — Telehealth: Payer: Self-pay | Admitting: *Deleted

## 2011-09-15 NOTE — Telephone Encounter (Signed)
Pt states she saw Dr. Kirke Corin last Friday for post cath f/u. She was doing well at that time. Pt states she take Lopressor two times a day. She states yesterday she noticed that her heart is skipping quite regular not. She states this am rhythm was normal but now it's irregular again. She states she has 4 normal beats and then a skip or 6 normal beats and then a skip. She states she doesn't have this all the time but would like to know if Dr. Kirke Corin knows why she is having it, should she be concerned about it, and does he need to see her sooner?

## 2011-09-16 NOTE — Telephone Encounter (Signed)
Pt notified and verbalized understanding.

## 2011-09-16 NOTE — Telephone Encounter (Signed)
These symptoms are due to PVCs which are not dangerous. Continue Metoprolol. Follow up in 6 months or earlier if they get worse.

## 2011-09-29 DIAGNOSIS — L851 Acquired keratosis [keratoderma] palmaris et plantaris: Secondary | ICD-10-CM | POA: Diagnosis not present

## 2011-10-02 DIAGNOSIS — Z961 Presence of intraocular lens: Secondary | ICD-10-CM | POA: Diagnosis not present

## 2011-10-02 DIAGNOSIS — H4011X0 Primary open-angle glaucoma, stage unspecified: Secondary | ICD-10-CM | POA: Diagnosis not present

## 2011-10-02 DIAGNOSIS — H26499 Other secondary cataract, unspecified eye: Secondary | ICD-10-CM | POA: Diagnosis not present

## 2011-10-05 DIAGNOSIS — I10 Essential (primary) hypertension: Secondary | ICD-10-CM | POA: Diagnosis not present

## 2011-10-05 DIAGNOSIS — E7800 Pure hypercholesterolemia, unspecified: Secondary | ICD-10-CM | POA: Diagnosis not present

## 2011-10-08 DIAGNOSIS — I10 Essential (primary) hypertension: Secondary | ICD-10-CM | POA: Diagnosis not present

## 2011-10-08 DIAGNOSIS — R002 Palpitations: Secondary | ICD-10-CM | POA: Diagnosis not present

## 2011-10-08 DIAGNOSIS — E7800 Pure hypercholesterolemia, unspecified: Secondary | ICD-10-CM | POA: Diagnosis not present

## 2011-10-27 DIAGNOSIS — H9209 Otalgia, unspecified ear: Secondary | ICD-10-CM | POA: Diagnosis not present

## 2011-12-09 DIAGNOSIS — H26499 Other secondary cataract, unspecified eye: Secondary | ICD-10-CM | POA: Diagnosis not present

## 2012-01-12 DIAGNOSIS — H26499 Other secondary cataract, unspecified eye: Secondary | ICD-10-CM | POA: Diagnosis not present

## 2012-01-12 DIAGNOSIS — H4011X0 Primary open-angle glaucoma, stage unspecified: Secondary | ICD-10-CM | POA: Diagnosis not present

## 2012-02-08 DIAGNOSIS — E7800 Pure hypercholesterolemia, unspecified: Secondary | ICD-10-CM | POA: Diagnosis not present

## 2012-02-08 DIAGNOSIS — Z79899 Other long term (current) drug therapy: Secondary | ICD-10-CM | POA: Diagnosis not present

## 2012-02-12 DIAGNOSIS — I10 Essential (primary) hypertension: Secondary | ICD-10-CM | POA: Diagnosis not present

## 2012-02-12 DIAGNOSIS — R0789 Other chest pain: Secondary | ICD-10-CM | POA: Diagnosis not present

## 2012-02-12 DIAGNOSIS — F411 Generalized anxiety disorder: Secondary | ICD-10-CM | POA: Diagnosis not present

## 2012-02-12 DIAGNOSIS — E7800 Pure hypercholesterolemia, unspecified: Secondary | ICD-10-CM | POA: Diagnosis not present

## 2012-02-12 DIAGNOSIS — R002 Palpitations: Secondary | ICD-10-CM | POA: Diagnosis not present

## 2012-02-23 DIAGNOSIS — R197 Diarrhea, unspecified: Secondary | ICD-10-CM | POA: Diagnosis not present

## 2012-04-07 ENCOUNTER — Telehealth: Payer: Self-pay | Admitting: Cardiovascular Disease

## 2012-04-07 NOTE — Telephone Encounter (Signed)
Patient is experiencing irregular heartbeats.  She wants to know if she should come in to be checked out. She has a reminder in the system to rtn in March 2014 for 1 yr f/u

## 2012-04-07 NOTE — Telephone Encounter (Signed)
Patient has been having some skipped heart beats. Patient has checked her pulse and can feel it skipping beats at 80. Patient informed to come in for nurse visit in the am. Patient said metoprolol was working fine and now she thinks it need adjustment. Patient given appointment for nurse visit in the morning.

## 2012-04-08 ENCOUNTER — Telehealth: Payer: Self-pay | Admitting: Cardiology

## 2012-04-08 ENCOUNTER — Ambulatory Visit (INDEPENDENT_AMBULATORY_CARE_PROVIDER_SITE_OTHER): Payer: Medicare Other | Admitting: *Deleted

## 2012-04-08 VITALS — BP 147/64 | HR 85 | Ht 63.0 in | Wt 125.0 lb

## 2012-04-08 DIAGNOSIS — R002 Palpitations: Secondary | ICD-10-CM | POA: Diagnosis not present

## 2012-04-08 NOTE — Telephone Encounter (Signed)
Will be following Degent

## 2012-04-08 NOTE — Progress Notes (Signed)
OV scheduled for follow up for 10/15 at 2:00 with Dr. Myrtis Ser.

## 2012-04-11 ENCOUNTER — Encounter: Payer: Self-pay | Admitting: Cardiology

## 2012-04-12 ENCOUNTER — Ambulatory Visit (INDEPENDENT_AMBULATORY_CARE_PROVIDER_SITE_OTHER): Payer: Medicare Other | Admitting: Cardiology

## 2012-04-12 ENCOUNTER — Encounter: Payer: Self-pay | Admitting: Cardiology

## 2012-04-12 VITALS — BP 136/74 | HR 66 | Ht 63.0 in | Wt 124.0 lb

## 2012-04-12 DIAGNOSIS — I10 Essential (primary) hypertension: Secondary | ICD-10-CM

## 2012-04-12 DIAGNOSIS — R072 Precordial pain: Secondary | ICD-10-CM

## 2012-04-12 DIAGNOSIS — I4949 Other premature depolarization: Secondary | ICD-10-CM | POA: Diagnosis not present

## 2012-04-12 DIAGNOSIS — I493 Ventricular premature depolarization: Secondary | ICD-10-CM

## 2012-04-12 NOTE — Progress Notes (Signed)
Patient ID: Heidi Wallace, female   DOB: [birth year], 76 y.o.   MRN: [id number]   HPI  Patient is seen in followup coronary disease. She has mild disease. She also has PVCs that are symptomatic. She has been seen by Dr. Kirke Corin. He is now primarily in the [place] office. I will take over her cardiology care and I explained this to her and her husband. She stable. She has normal LV function. She has minimal coronary disease by catheterization March, 2013.  Last week she had family in from out of town and she felt stressed and had more palpitations from her PVCs. This week she feels much better.  Allergies  Allergen Reactions  . Ceclor (Cefaclor)   . Cephalosporins     REACTION: rash  . Clindamycin/Lincomycin     c-diff   . Keflex (Cephalexin)   . Morphine     REACTION: mother was,so pt ont take it  . Penicillins     REACTION: rash    Current Outpatient Prescriptions  Medication Sig Dispense Refill  . acetaminophen (TYLENOL EX ST ARTHRITIS PAIN) 500 MG tablet Take 1,000 mg by mouth as needed.      Marland Kitchen aspirin 81 MG tablet Take 81 mg by mouth daily.      Marland Kitchen atorvastatin (LIPITOR) 20 MG tablet Take 20 mg by mouth daily.       Marland Kitchen esomeprazole (NEXIUM) 40 MG capsule Take 40 mg by mouth daily before breakfast.        . gabapentin (NEURONTIN) 300 MG capsule Take 300 mg by mouth daily. At bedtime      . metoprolol tartrate (LOPRESSOR) 25 MG tablet Take 1 tablet (25 mg total) by mouth 2 (two) times daily.  60 tablet  6  . olmesartan-hydrochlorothiazide (BENICAR HCT) 40-12.5 MG per tablet Take 0.5 tablets by mouth daily.        . travoprost, benzalkonium, (TRAVATAN Z) 0.004 % ophthalmic solution Place 1 drop into both eyes at bedtime.        . Wheat Dextrin (BENEFIBER DRINK MIX PO) Take 1 Package by mouth daily as needed.         History   Social History  . Marital Status: Married    Spouse Name: N/A    Number of Children: N/A  . Years of Education: N/A   Occupational History  . former  Charity fundraiser    Social History Main Topics  . Smoking status: Former Smoker -- 1.0 packs/day for 16 years    Types: Cigarettes    Quit date: 06/29/1972  . Smokeless tobacco: Never Used  . Alcohol Use: Not on file  . Drug Use: Not on file  . Sexually Active: Not on file   Other Topics Concern  . Not on file   Social History Narrative  . No narrative on file    Family History  Problem Relation Age of Onset  . Heart disease Father   . Heart disease Mother     Past Medical History  Diagnosis Date  . Chest pain   . GERD (gastroesophageal reflux disease)   . Hypertension   . Post concussion syndrome   . Coronary artery disease     cardiac cath 08/2011: veavily calcified coronary arteries without obstructive disease. 40% proximal LAD, 40% in OMs.   . PVC's (premature ventricular contractions)     PVCs and ventricular bigeminy with abnormal nuclear test  . Hyperlipidemia     Past Surgical History  Procedure Date  . Gallbladder  surgery 2003  . Tubal ligation 1974  . Breast cyst excision benign right breast    1982  . Rotator cuff repair right 1989  . Cardiac catheterization     Patient Active Problem List  Diagnosis  . GERD  . PRECORDIAL PAIN  . GERD (gastroesophageal reflux disease)  . Hypertension  . Hyperlipidemia  . Post concussion syndrome  . Lung nodule  . PVC's (premature ventricular contractions)  . Coronary artery disease    ROS   Patient denies fever, chills, headache, sweats, rash, change in vision, change in hearing, chest pain, cough, nausea vomiting, urinary symptoms. All other systems are reviewed and are negative.  PHYSICAL EXAM  Patients here with her husband. She is oriented to person time and place. Affect is normal. There is no jugulovenous distention. Lungs are clear. Respiratory effort is nonlabored. Cardiac exam reveals S1 and S2. There no clicks or significant murmurs. The abdomen is soft. Is no peripheral edema.  Filed Vitals:   04/12/12 1359    BP: 136/74  Pulse: 66  Height: 5\' 3"  (1.6 m)  Weight: 124 lb (56.246 kg)     ASSESSMENT & PLAN

## 2012-04-12 NOTE — Assessment & Plan Note (Signed)
She's not having any significant pain at this time. No further workup. We know that she has only mild coronary disease.

## 2012-04-12 NOTE — Assessment & Plan Note (Signed)
Patient has some symptoms from her PVCs. We talked about this and I reassured her. She seemed quite reassured. No further workup.

## 2012-04-12 NOTE — Patient Instructions (Addendum)

## 2012-04-12 NOTE — Assessment & Plan Note (Signed)
Blood pressures control. No change in therapy. 

## 2012-04-14 DIAGNOSIS — H4011X0 Primary open-angle glaucoma, stage unspecified: Secondary | ICD-10-CM | POA: Diagnosis not present

## 2012-04-27 DIAGNOSIS — Z85828 Personal history of other malignant neoplasm of skin: Secondary | ICD-10-CM | POA: Diagnosis not present

## 2012-04-27 DIAGNOSIS — L821 Other seborrheic keratosis: Secondary | ICD-10-CM | POA: Diagnosis not present

## 2012-04-27 DIAGNOSIS — D485 Neoplasm of uncertain behavior of skin: Secondary | ICD-10-CM | POA: Diagnosis not present

## 2012-04-27 DIAGNOSIS — L820 Inflamed seborrheic keratosis: Secondary | ICD-10-CM | POA: Diagnosis not present

## 2012-04-27 DIAGNOSIS — L570 Actinic keratosis: Secondary | ICD-10-CM | POA: Diagnosis not present

## 2012-04-27 DIAGNOSIS — Z23 Encounter for immunization: Secondary | ICD-10-CM | POA: Diagnosis not present

## 2012-06-02 DIAGNOSIS — I10 Essential (primary) hypertension: Secondary | ICD-10-CM | POA: Diagnosis not present

## 2012-06-02 DIAGNOSIS — E7800 Pure hypercholesterolemia, unspecified: Secondary | ICD-10-CM | POA: Diagnosis not present

## 2012-06-09 DIAGNOSIS — E7800 Pure hypercholesterolemia, unspecified: Secondary | ICD-10-CM | POA: Diagnosis not present

## 2012-06-09 DIAGNOSIS — F411 Generalized anxiety disorder: Secondary | ICD-10-CM | POA: Diagnosis not present

## 2012-06-09 DIAGNOSIS — I10 Essential (primary) hypertension: Secondary | ICD-10-CM | POA: Diagnosis not present

## 2012-06-15 DIAGNOSIS — R51 Headache: Secondary | ICD-10-CM | POA: Diagnosis not present

## 2012-07-19 DIAGNOSIS — M940 Chondrocostal junction syndrome [Tietze]: Secondary | ICD-10-CM | POA: Diagnosis not present

## 2012-07-19 DIAGNOSIS — R079 Chest pain, unspecified: Secondary | ICD-10-CM | POA: Diagnosis not present

## 2012-07-28 DIAGNOSIS — H4011X0 Primary open-angle glaucoma, stage unspecified: Secondary | ICD-10-CM | POA: Diagnosis not present

## 2012-08-30 DIAGNOSIS — M2040 Other hammer toe(s) (acquired), unspecified foot: Secondary | ICD-10-CM | POA: Diagnosis not present

## 2012-09-23 DIAGNOSIS — R05 Cough: Secondary | ICD-10-CM | POA: Diagnosis not present

## 2012-09-23 DIAGNOSIS — M549 Dorsalgia, unspecified: Secondary | ICD-10-CM | POA: Diagnosis not present

## 2012-09-23 DIAGNOSIS — IMO0002 Reserved for concepts with insufficient information to code with codable children: Secondary | ICD-10-CM | POA: Diagnosis not present

## 2012-10-04 DIAGNOSIS — E7800 Pure hypercholesterolemia, unspecified: Secondary | ICD-10-CM | POA: Diagnosis not present

## 2012-10-04 DIAGNOSIS — E559 Vitamin D deficiency, unspecified: Secondary | ICD-10-CM | POA: Diagnosis not present

## 2012-10-04 DIAGNOSIS — I10 Essential (primary) hypertension: Secondary | ICD-10-CM | POA: Diagnosis not present

## 2012-10-10 DIAGNOSIS — I10 Essential (primary) hypertension: Secondary | ICD-10-CM | POA: Diagnosis not present

## 2012-10-10 DIAGNOSIS — F411 Generalized anxiety disorder: Secondary | ICD-10-CM | POA: Diagnosis not present

## 2012-10-10 DIAGNOSIS — R002 Palpitations: Secondary | ICD-10-CM | POA: Diagnosis not present

## 2012-10-10 DIAGNOSIS — E559 Vitamin D deficiency, unspecified: Secondary | ICD-10-CM | POA: Diagnosis not present

## 2012-10-10 DIAGNOSIS — E7800 Pure hypercholesterolemia, unspecified: Secondary | ICD-10-CM | POA: Diagnosis not present

## 2012-10-19 ENCOUNTER — Encounter: Payer: Self-pay | Admitting: Cardiovascular Disease

## 2012-10-19 DIAGNOSIS — Z79899 Other long term (current) drug therapy: Secondary | ICD-10-CM | POA: Diagnosis not present

## 2012-10-19 DIAGNOSIS — I10 Essential (primary) hypertension: Secondary | ICD-10-CM | POA: Diagnosis not present

## 2012-10-19 DIAGNOSIS — Z7982 Long term (current) use of aspirin: Secondary | ICD-10-CM | POA: Diagnosis not present

## 2012-10-19 DIAGNOSIS — R0789 Other chest pain: Secondary | ICD-10-CM | POA: Diagnosis not present

## 2012-10-19 DIAGNOSIS — Z883 Allergy status to other anti-infective agents status: Secondary | ICD-10-CM | POA: Diagnosis not present

## 2012-10-19 DIAGNOSIS — E785 Hyperlipidemia, unspecified: Secondary | ICD-10-CM | POA: Diagnosis not present

## 2012-10-19 DIAGNOSIS — I2510 Atherosclerotic heart disease of native coronary artery without angina pectoris: Secondary | ICD-10-CM | POA: Diagnosis not present

## 2012-10-19 DIAGNOSIS — K219 Gastro-esophageal reflux disease without esophagitis: Secondary | ICD-10-CM | POA: Diagnosis not present

## 2012-10-19 DIAGNOSIS — Z885 Allergy status to narcotic agent status: Secondary | ICD-10-CM | POA: Diagnosis not present

## 2012-10-19 DIAGNOSIS — Z880 Allergy status to penicillin: Secondary | ICD-10-CM | POA: Diagnosis not present

## 2012-10-19 DIAGNOSIS — I498 Other specified cardiac arrhythmias: Secondary | ICD-10-CM | POA: Diagnosis not present

## 2012-10-19 DIAGNOSIS — I499 Cardiac arrhythmia, unspecified: Secondary | ICD-10-CM | POA: Diagnosis not present

## 2012-10-19 DIAGNOSIS — R110 Nausea: Secondary | ICD-10-CM | POA: Diagnosis not present

## 2012-10-20 ENCOUNTER — Encounter: Payer: Self-pay | Admitting: Cardiovascular Disease

## 2012-10-20 DIAGNOSIS — I498 Other specified cardiac arrhythmias: Secondary | ICD-10-CM | POA: Diagnosis not present

## 2012-10-20 DIAGNOSIS — R0789 Other chest pain: Secondary | ICD-10-CM | POA: Diagnosis not present

## 2012-10-20 DIAGNOSIS — I10 Essential (primary) hypertension: Secondary | ICD-10-CM | POA: Diagnosis not present

## 2012-10-25 ENCOUNTER — Encounter: Payer: Self-pay | Admitting: Cardiovascular Disease

## 2012-10-25 ENCOUNTER — Ambulatory Visit (INDEPENDENT_AMBULATORY_CARE_PROVIDER_SITE_OTHER): Payer: Medicare Other | Admitting: Cardiovascular Disease

## 2012-10-25 VITALS — BP 142/78 | HR 81 | Ht 63.0 in | Wt 127.2 lb

## 2012-10-25 DIAGNOSIS — I4949 Other premature depolarization: Secondary | ICD-10-CM

## 2012-10-25 DIAGNOSIS — R079 Chest pain, unspecified: Secondary | ICD-10-CM

## 2012-10-25 DIAGNOSIS — E785 Hyperlipidemia, unspecified: Secondary | ICD-10-CM | POA: Diagnosis not present

## 2012-10-25 DIAGNOSIS — I10 Essential (primary) hypertension: Secondary | ICD-10-CM | POA: Diagnosis not present

## 2012-10-25 DIAGNOSIS — I493 Ventricular premature depolarization: Secondary | ICD-10-CM

## 2012-10-25 DIAGNOSIS — I2510 Atherosclerotic heart disease of native coronary artery without angina pectoris: Secondary | ICD-10-CM

## 2012-10-25 NOTE — Assessment & Plan Note (Signed)
Blood pressure is reasonably controlled on current medications which will be continued.

## 2012-10-25 NOTE — Patient Instructions (Addendum)
Continue same medications.  Follow up in 6 months.  

## 2012-10-25 NOTE — Progress Notes (Signed)
Primary care physician:  Dr. Neita Carp  HPI  This is a 77 year old female who is here today for a followup visit regarding palpitations due to PVCs with occasional ventricular bigeminy, and mild to moderate nonobstructive coronary artery disease. Cardiac catheterization was performed in 2013 after an abnormal stress test and showed mild to moderate nonobstructive coronary artery disease with heavy calcifications. Ejection fraction was normal. She was started on metoprolol 25 mg twice daily for symptomatic PVCs. Her symptoms improved for a while but worsened few months later. Thus, the dose of metoprolol was increased to 50 mg in the morning and 20 5 in the evening. Benicar/hydrochlorothiazide was discontinued. She presented recently to [facility] with chest pain in the setting of bradycardia and a heart rate in the 40s. She was also hypertensive on presentation. Cardiac enzymes were negative. CTA of the chest showed no evidence of pulmonary embolism. The dose of metoprolol was decreased back to 25 mg twice daily and Benicar/hydrochlorothiazide was resumed. Since then, she reports improvement in her symptoms. She tried to get an appointment with Dr. Myrtis Ser in [place] but there was no opening and thus she decided to followup with me here in [place]. Overall she feels better with no recurrent chest pain or palpitations.  Allergies  Allergen Reactions  . Ceclor (Cefaclor)   . Cephalosporins     REACTION: rash  . Clindamycin/Lincomycin     c-diff   . Keflex (Cephalexin)   . Levaquin (Levofloxacin In D5w)   . Morphine     REACTION: mother was,so pt ont take it  . Penicillins     REACTION: rash     Current Outpatient Prescriptions on File Prior to Visit  Medication Sig Dispense Refill  . acetaminophen (TYLENOL EX ST ARTHRITIS PAIN) 500 MG tablet Take 1,000 mg by mouth as needed.      Marland Kitchen aspirin 81 MG tablet Take 81 mg by mouth daily.      Marland Kitchen atorvastatin (LIPITOR) 20 MG tablet Take 20 mg by  mouth daily.       Marland Kitchen esomeprazole (NEXIUM) 40 MG capsule Take 40 mg by mouth daily before breakfast.        . gabapentin (NEURONTIN) 300 MG capsule Take 300 mg by mouth daily. At bedtime      . metoprolol tartrate (LOPRESSOR) 25 MG tablet Take 1 tablet (25 mg total) by mouth 2 (two) times daily.  60 tablet  6  . olmesartan-hydrochlorothiazide (BENICAR HCT) 40-12.5 MG per tablet Take 0.5 tablets by mouth daily.        . travoprost, benzalkonium, (TRAVATAN Z) 0.004 % ophthalmic solution Place 1 drop into both eyes at bedtime.        . Wheat Dextrin (BENEFIBER DRINK MIX PO) Take 1 Package by mouth daily as needed.        No current facility-administered medications on file prior to visit.     Past Medical History  Diagnosis Date  . Chest pain   . GERD (gastroesophageal reflux disease)   . Hypertension   . Post concussion syndrome   . PVC's (premature ventricular contractions)     PVCs and ventricular bigeminy with abnormal nuclear test  . Hyperlipidemia   . Coronary artery disease     cardiac cath 08/2011: heavily calcified coronary arteries without obstructive disease. 40% proximal LAD, 40% in OMs.      Past Surgical History  Procedure Laterality Date  . Gallbladder surgery  2003  . Tubal ligation  1974  . Breast  cyst excision  benign right breast    1982  . Rotator cuff repair  right 1989  . Cardiac catheterization  07/2011    [facility]; [place]     Family History  Problem Relation Age of Onset  . Heart disease Father   . Heart disease Mother      History   Social History  . Marital Status: Married    Spouse Name: N/A    Number of Children: N/A  . Years of Education: N/A   Occupational History  . former Charity fundraiser    Social History Main Topics  . Smoking status: Former Smoker -- 1.00 packs/day for 16 years    Types: Cigarettes    Quit date: 06/29/1972  . Smokeless tobacco: Never Used  . Alcohol Use: No  . Drug Use: No  . Sexually Active: Not on file   Other Topics  Concern  . Not on file   Social History Narrative  . No narrative on file     PHYSICAL EXAM   BP 142/78  Pulse 81  Ht 5\' 3"  (1.6 m)  Wt 127 lb 4 oz (57.72 kg)  BMI 22.55 kg/m2 Constitutional: She is oriented to person, place, and time. She appears well-developed and well-nourished. No distress.  HENT: No nasal discharge.  Head: Normocephalic and atraumatic.  Eyes: Pupils are equal and round. Right eye exhibits no discharge. Left eye exhibits no discharge.  Neck: Normal range of motion. Neck supple. No JVD present. No thyromegaly present.  Cardiovascular: Normal rate, regular rhythm, normal heart sounds. Exam reveals no gallop and no friction rub. No murmur heard.  Pulmonary/Chest: Effort normal and breath sounds normal. No stridor. No respiratory distress. She has no wheezes. She has no rales. She exhibits no tenderness.  Abdominal: Soft. Bowel sounds are normal. She exhibits no distension. There is no tenderness. There is no rebound and no guarding.  Musculoskeletal: Normal range of motion. She exhibits no edema and no tenderness.  Neurological: She is alert and oriented to person, place, and time. Coordination normal.  Skin: Skin is warm and dry. No rash noted. She is not diaphoretic. No erythema. No pallor.  Psychiatric: She has a normal mood and affect. Her behavior is normal. Judgment and thought content normal.   EKG: Normal sinus rhythm with low voltage. No significant ST or T wave changes. Normal QT interval.  ASSESSMENT AND PLAN

## 2012-10-25 NOTE — Assessment & Plan Note (Signed)
Continue treatment with atorvastatin. 

## 2012-10-25 NOTE — Assessment & Plan Note (Addendum)
Cardiac catheterization in March of 2013 showed calcified coronary arteries with mild to moderate nonobstructive disease. I recommend continuing medical therapy with small dose daily aspirin, blood pressure control and a statin. Recent chest pain was overall atypical and was in the setting of bradycardia and high blood pressure.

## 2012-10-25 NOTE — Assessment & Plan Note (Signed)
The patient has symptomatic PVCs. It appears at higher dose of metoprolol cause bradycardia and thus I recommend continuing current dose of 25 mg once daily. Given that she does not have evidence of structural or significant ischemic heart disease, I reassured her again that these are not life-threatening.

## 2012-10-27 ENCOUNTER — Telehealth: Payer: Self-pay

## 2012-10-27 NOTE — Telephone Encounter (Signed)
lmtcb

## 2012-10-27 NOTE — Telephone Encounter (Signed)
Pt has a questions about her medications Metroprolol. Please call, she will be home until 12 noon.

## 2012-10-27 NOTE — Telephone Encounter (Signed)
She should take either Metoprolol tartrate 25 mg bid or Metoprolol Succinate 50 mg once daily.

## 2012-10-27 NOTE — Telephone Encounter (Signed)
Pt asks if she should take metoprolol 25 mg daily to see if this will help her symptoms of weakness and fatigue Has occasional chest heaviness HR=71 with few PVCs Says she was taking metoprolol succinate 50 mg daily and PCP made suggestion to decrease to 25 mg daily Wanted to check with Dr. Kirke Corin first I will make him aware and call her back

## 2012-10-28 NOTE — Telephone Encounter (Signed)
lmtcb

## 2012-10-28 NOTE — Telephone Encounter (Signed)
Pt informed She says she held metoprolol today and "feels better" I told her I would let Dr. Kirke Corin know but I advised she take meds as he suggested She will try resuming toprol as prescribed

## 2012-11-09 DIAGNOSIS — D485 Neoplasm of uncertain behavior of skin: Secondary | ICD-10-CM | POA: Diagnosis not present

## 2012-11-09 DIAGNOSIS — R233 Spontaneous ecchymoses: Secondary | ICD-10-CM | POA: Diagnosis not present

## 2012-11-19 DIAGNOSIS — R5381 Other malaise: Secondary | ICD-10-CM | POA: Diagnosis not present

## 2012-11-19 DIAGNOSIS — I10 Essential (primary) hypertension: Secondary | ICD-10-CM | POA: Diagnosis not present

## 2012-11-19 DIAGNOSIS — R5383 Other fatigue: Secondary | ICD-10-CM | POA: Diagnosis not present

## 2012-11-19 DIAGNOSIS — E7800 Pure hypercholesterolemia, unspecified: Secondary | ICD-10-CM | POA: Diagnosis not present

## 2012-11-19 DIAGNOSIS — I498 Other specified cardiac arrhythmias: Secondary | ICD-10-CM | POA: Diagnosis not present

## 2012-11-19 DIAGNOSIS — F321 Major depressive disorder, single episode, moderate: Secondary | ICD-10-CM | POA: Diagnosis not present

## 2012-12-09 DIAGNOSIS — F411 Generalized anxiety disorder: Secondary | ICD-10-CM | POA: Diagnosis not present

## 2012-12-09 DIAGNOSIS — R002 Palpitations: Secondary | ICD-10-CM | POA: Diagnosis not present

## 2012-12-09 DIAGNOSIS — E559 Vitamin D deficiency, unspecified: Secondary | ICD-10-CM | POA: Diagnosis not present

## 2012-12-09 DIAGNOSIS — E7800 Pure hypercholesterolemia, unspecified: Secondary | ICD-10-CM | POA: Diagnosis not present

## 2012-12-09 DIAGNOSIS — I10 Essential (primary) hypertension: Secondary | ICD-10-CM | POA: Diagnosis not present

## 2012-12-22 DIAGNOSIS — L03039 Cellulitis of unspecified toe: Secondary | ICD-10-CM | POA: Diagnosis not present

## 2012-12-26 DIAGNOSIS — H35419 Lattice degeneration of retina, unspecified eye: Secondary | ICD-10-CM | POA: Diagnosis not present

## 2012-12-26 DIAGNOSIS — Z961 Presence of intraocular lens: Secondary | ICD-10-CM | POA: Diagnosis not present

## 2012-12-26 DIAGNOSIS — H43399 Other vitreous opacities, unspecified eye: Secondary | ICD-10-CM | POA: Diagnosis not present

## 2012-12-26 DIAGNOSIS — H4011X0 Primary open-angle glaucoma, stage unspecified: Secondary | ICD-10-CM | POA: Diagnosis not present

## 2012-12-28 DIAGNOSIS — I739 Peripheral vascular disease, unspecified: Secondary | ICD-10-CM | POA: Diagnosis not present

## 2013-01-03 DIAGNOSIS — I10 Essential (primary) hypertension: Secondary | ICD-10-CM | POA: Diagnosis not present

## 2013-01-03 DIAGNOSIS — E7800 Pure hypercholesterolemia, unspecified: Secondary | ICD-10-CM | POA: Diagnosis not present

## 2013-01-03 DIAGNOSIS — E559 Vitamin D deficiency, unspecified: Secondary | ICD-10-CM | POA: Diagnosis not present

## 2013-01-10 DIAGNOSIS — I10 Essential (primary) hypertension: Secondary | ICD-10-CM | POA: Diagnosis not present

## 2013-01-10 DIAGNOSIS — R002 Palpitations: Secondary | ICD-10-CM | POA: Diagnosis not present

## 2013-01-10 DIAGNOSIS — E7800 Pure hypercholesterolemia, unspecified: Secondary | ICD-10-CM | POA: Diagnosis not present

## 2013-01-10 DIAGNOSIS — F411 Generalized anxiety disorder: Secondary | ICD-10-CM | POA: Diagnosis not present

## 2013-01-10 DIAGNOSIS — E559 Vitamin D deficiency, unspecified: Secondary | ICD-10-CM | POA: Diagnosis not present

## 2013-01-14 DIAGNOSIS — B373 Candidiasis of vulva and vagina: Secondary | ICD-10-CM | POA: Diagnosis not present

## 2013-01-14 DIAGNOSIS — R300 Dysuria: Secondary | ICD-10-CM | POA: Diagnosis not present

## 2013-01-14 DIAGNOSIS — R195 Other fecal abnormalities: Secondary | ICD-10-CM | POA: Diagnosis not present

## 2013-01-14 DIAGNOSIS — M545 Low back pain: Secondary | ICD-10-CM | POA: Diagnosis not present

## 2013-01-14 DIAGNOSIS — R10819 Abdominal tenderness, unspecified site: Secondary | ICD-10-CM | POA: Diagnosis not present

## 2013-02-04 ENCOUNTER — Encounter: Payer: Self-pay | Admitting: Cardiology

## 2013-02-04 DIAGNOSIS — Z87891 Personal history of nicotine dependence: Secondary | ICD-10-CM | POA: Diagnosis not present

## 2013-02-04 DIAGNOSIS — Z79899 Other long term (current) drug therapy: Secondary | ICD-10-CM | POA: Diagnosis not present

## 2013-02-04 DIAGNOSIS — E876 Hypokalemia: Secondary | ICD-10-CM | POA: Diagnosis not present

## 2013-02-04 DIAGNOSIS — R1011 Right upper quadrant pain: Secondary | ICD-10-CM | POA: Diagnosis not present

## 2013-02-04 DIAGNOSIS — R5381 Other malaise: Secondary | ICD-10-CM | POA: Diagnosis not present

## 2013-02-04 DIAGNOSIS — Z880 Allergy status to penicillin: Secondary | ICD-10-CM | POA: Diagnosis not present

## 2013-02-04 DIAGNOSIS — R197 Diarrhea, unspecified: Secondary | ICD-10-CM | POA: Diagnosis not present

## 2013-02-04 DIAGNOSIS — R110 Nausea: Secondary | ICD-10-CM | POA: Diagnosis not present

## 2013-02-04 DIAGNOSIS — R748 Abnormal levels of other serum enzymes: Secondary | ICD-10-CM | POA: Diagnosis not present

## 2013-02-04 DIAGNOSIS — I10 Essential (primary) hypertension: Secondary | ICD-10-CM | POA: Diagnosis not present

## 2013-02-04 DIAGNOSIS — E871 Hypo-osmolality and hyponatremia: Secondary | ICD-10-CM | POA: Diagnosis not present

## 2013-02-04 DIAGNOSIS — R509 Fever, unspecified: Secondary | ICD-10-CM | POA: Diagnosis not present

## 2013-02-04 DIAGNOSIS — Z7982 Long term (current) use of aspirin: Secondary | ICD-10-CM | POA: Diagnosis not present

## 2013-02-04 DIAGNOSIS — I499 Cardiac arrhythmia, unspecified: Secondary | ICD-10-CM | POA: Diagnosis not present

## 2013-02-04 DIAGNOSIS — R7989 Other specified abnormal findings of blood chemistry: Secondary | ICD-10-CM | POA: Diagnosis not present

## 2013-02-06 DIAGNOSIS — R1011 Right upper quadrant pain: Secondary | ICD-10-CM | POA: Diagnosis not present

## 2013-02-06 DIAGNOSIS — R197 Diarrhea, unspecified: Secondary | ICD-10-CM | POA: Diagnosis not present

## 2013-02-09 DIAGNOSIS — R1011 Right upper quadrant pain: Secondary | ICD-10-CM | POA: Diagnosis not present

## 2013-03-08 DIAGNOSIS — I739 Peripheral vascular disease, unspecified: Secondary | ICD-10-CM | POA: Diagnosis not present

## 2013-03-24 DIAGNOSIS — H9209 Otalgia, unspecified ear: Secondary | ICD-10-CM | POA: Diagnosis not present

## 2013-04-19 ENCOUNTER — Ambulatory Visit (INDEPENDENT_AMBULATORY_CARE_PROVIDER_SITE_OTHER): Payer: Medicare Other | Admitting: Cardiology

## 2013-04-19 ENCOUNTER — Encounter: Payer: Self-pay | Admitting: Cardiology

## 2013-04-19 VITALS — BP 128/71 | HR 69 | Ht 63.0 in | Wt 129.0 lb

## 2013-04-19 DIAGNOSIS — I25810 Atherosclerosis of coronary artery bypass graft(s) without angina pectoris: Secondary | ICD-10-CM

## 2013-04-19 NOTE — Patient Instructions (Signed)
Your physician recommends that you schedule a follow-up appointment in: 6 months with Dr. Branch. You should receive a letter in the mail in 4 months. If you do not receive this letter by February 2015 call our office to schedule this appointment.   Your physician recommends that you continue on your current medications as directed. Please refer to the Current Medication list given to you today.  

## 2013-04-19 NOTE — Progress Notes (Signed)
Clinical Summary Heidi Wallace is a 77 y.o.female  1. Palpitations - from prior notes, caused by PVCs and occasional ventricular bigeminy. - on metoprolol, increased to 50mg  bid previously with bradycardia. Reports recently switched to Toprol XL 25mg  daily by her PCP - denies any significant palpitations. Reports significant difference since stopping caffeine all together and changing to Toprol XL - denies lightheadness or dizziness  2. CAD - prior cath 2013 after abnormal stress test, showed moderate non-obstructive disease as described below, LVEF 55% - no recent chest pain, no SOB, no DOE. Fairly sedentary lifestyle.   3. HL - stop lipitor mid August. Around that time she was having diarrhea, RUQ pain, with dark yellow urine and stools. States she was on macrobid at the same time. After stopping pain resolved after 2 weeks. Reports she was told her LFTs elevated at that time.  - had been on lipitor for 10 years, no troubles prior. - previously was on zocor which did not get her at goal  4. HTN - checks bp once a week. Typically 120s/70s, pulse in 70s - compliant w/ meds  Past Medical History  Diagnosis Date  . Chest pain   . GERD (gastroesophageal reflux disease)   . Hypertension   . Post concussion syndrome   . PVC's (premature ventricular contractions)     PVCs and ventricular bigeminy with abnormal nuclear test  . Hyperlipidemia   . Coronary artery disease     cardiac cath 08/2011: heavily calcified coronary arteries without obstructive disease. 40% proximal LAD, 40% in OMs.      Allergies  Allergen Reactions  . Ceclor [Cefaclor]   . Cephalosporins     REACTION: rash  . Clindamycin/Lincomycin     c-diff   . Keflex [Cephalexin]   . Levaquin [Levofloxacin In D5w]   . Morphine     REACTION: mother was,so pt ont take it  . Penicillins     REACTION: rash     Current Outpatient Prescriptions  Medication Sig Dispense Refill  . acetaminophen (TYLENOL EX ST  ARTHRITIS PAIN) 500 MG tablet Take 1,000 mg by mouth as needed.      Marland Kitchen aspirin 81 MG tablet Take 81 mg by mouth daily.      Marland Kitchen atorvastatin (LIPITOR) 20 MG tablet Take 20 mg by mouth daily.       Marland Kitchen esomeprazole (NEXIUM) 40 MG capsule Take 40 mg by mouth daily before breakfast.        . gabapentin (NEURONTIN) 300 MG capsule Take 300 mg by mouth daily. At bedtime      . metoprolol tartrate (LOPRESSOR) 25 MG tablet Take 1 tablet (25 mg total) by mouth 2 (two) times daily.  60 tablet  6  . olmesartan-hydrochlorothiazide (BENICAR HCT) 40-12.5 MG per tablet Take 0.5 tablets by mouth daily.        . travoprost, benzalkonium, (TRAVATAN Z) 0.004 % ophthalmic solution Place 1 drop into both eyes at bedtime.        . Wheat Dextrin (BENEFIBER DRINK MIX PO) Take 1 Package by mouth daily as needed.        No current facility-administered medications for this visit.     Past Surgical History  Procedure Laterality Date  . Gallbladder surgery  2003  . Tubal ligation  1974  . Breast cyst excision  benign right breast    1982  . Rotator cuff repair  right 1989  . Cardiac catheterization  07/2011    [facility]; Kirke Corin  Allergies  Allergen Reactions  . Ceclor [Cefaclor]   . Cephalosporins     REACTION: rash  . Clindamycin/Lincomycin     c-diff   . Keflex [Cephalexin]   . Levaquin [Levofloxacin In D5w]   . Morphine     REACTION: mother was,so pt ont take it  . Penicillins     REACTION: rash      Family History  Problem Relation Age of Onset  . Heart disease Father   . Heart disease Mother      Social History Heidi Wallace reports that she quit smoking about 40 years ago. Her smoking use included Cigarettes. She has a 16 pack-year smoking history. She has never used smokeless tobacco. Heidi Wallace reports that she does not drink alcohol.   Review of Systems CONSTITUTIONAL: No weight loss, fever, chills, weakness or fatigue.  HEENT: Eyes: No visual loss, blurred vision, double vision or yellow  sclerae.No hearing loss, sneezing, congestion, runny nose or sore throat.  SKIN: No rash or itching.  CARDIOVASCULAR: per HPI RESPIRATORY: No shortness of breath, cough or sputum.  GASTROINTESTINAL: No anorexia, nausea, vomiting or diarrhea. No abdominal pain or blood.  GENITOURINARY: No burning on urination, no polyuria NEUROLOGICAL: No headache, dizziness, syncope, paralysis, ataxia, numbness or tingling in the extremities. No change in bowel or bladder control.  MUSCULOSKELETAL: No muscle, back pain, joint pain or stiffness.  LYMPHATICS: No enlarged nodes. No history of splenectomy.  PSYCHIATRIC: No history of depression or anxiety.  ENDOCRINOLOGIC: No reports of sweating, cold or heat intolerance. No polyuria or polydipsia.  Marland Kitchen   Physical Examination p 69 bp 128/71 Wt 129 lbs BMI 23 Gen: resting comfortably, no acute distress HEENT: no scleral icterus, pupils equal round and reactive, no palptable cervical adenopathy,  CV: RRR, no m/r/g, no JVD, no carotid bruits Resp: Clear to auscultation bilaterally GI: abdomen is soft, non-tender, non-distended, normal bowel sounds, no hepatosplenomegaly MSK: extremities are warm, no edema.  Skin: warm, no rash Neuro:  no focal deficits Psych: appropriate affect   Diagnostic Studies 07/2011 Cath Hemodynamics:  AO: 138/72 mmHg  LV: 135/6 mmHg  LVEDP: 11 mmHg  Coronary angiography:  Coronary dominance: Codominant.  Left Main: Normal in size and shows. If this moderately calcified without evidence of obstructive disease.  Left Anterior Descending (LAD): The vessel is heavily calcified in the proximal and midsegment. There is a diffuse 40% stenosis proximally. The mid segment has 20% diffuse disease. There is a 30% tubular stenosis distally.  1st diagonal (D1): Small in size with mild diffuse atherosclerosis.  2nd diagonal (D2): Medium in size with minor irregularities.  3rd diagonal (D3): Small in size.  Circumflex (LCx): The vessel is  normal in size and codominant. The vessel has minor irregularities throughout its course without evidence of active disease.  1st obtuse marginal: Medium in size with 40% ostial disease.  2nd obtuse marginal: Medium in size with 40-50% proximal disease.  3rd obtuse marginal: Medium in size with minor irregularities.  Right Coronary Artery: The vessel has an anterior downward takeoff. It is heavily calcified. It was nonselectively engaged with an AR-1 catheter. It has minor irregularities without evidence of obstructive disease.  posterior descending artery: Small in size.  posterior lateral Renna Kilmer: Normal in size with minor irregularities. Left ventriculography: Left ventricular systolic function is normal , LVEF is estimated at 55 %, there is no significant mitral regurgitation . The ascending aorta appears to be mildly dilated.  Final Conclusions:  1. Heavily calcified coronary arteries with  moderate nonobstructive coronary artery disease.  2. Normal LV systolic function and filling pressures.  3. Mildly dilated descending aorta.  Recommendations: I recommend aggressive medical therapy. More aggressive treatment of hyperlipidemia might be appropriate with a target LDL of less than 70. Due to her frequent PVCs, I will start her on a beta blocker.     Pertinent labs 09/2012 Na 149 K 3.7 Cr 0.94 BUN 17 ALT 28 AST 25   01/2012: TC 217 TG 65 HDL 82 LDL 122  Assessment and Plan  1. Palpitations - no significant symptoms currently. Noted big improvement after stopping all caffeine and switching to Toprol XL daily - continue current meds  2. HTN - at goal, continue current meds  3. HL - unclear history of when her lipitor was stopped, and if she truly had elevated LFTs and how severe they were increased - will obtain records and review, if she can tolerate statin would greatly benefit given her known moderate CAD.   4. CAD - moderate, non-obstructive by cath - continue risk factor  modification.       Antoine Poche, M.D., F.A.C.C.

## 2013-04-20 DIAGNOSIS — Z23 Encounter for immunization: Secondary | ICD-10-CM | POA: Diagnosis not present

## 2013-04-28 DIAGNOSIS — J019 Acute sinusitis, unspecified: Secondary | ICD-10-CM | POA: Diagnosis not present

## 2013-04-28 DIAGNOSIS — R05 Cough: Secondary | ICD-10-CM | POA: Diagnosis not present

## 2013-05-03 DIAGNOSIS — M79609 Pain in unspecified limb: Secondary | ICD-10-CM | POA: Diagnosis not present

## 2013-05-03 DIAGNOSIS — M2040 Other hammer toe(s) (acquired), unspecified foot: Secondary | ICD-10-CM | POA: Diagnosis not present

## 2013-05-04 DIAGNOSIS — H4011X0 Primary open-angle glaucoma, stage unspecified: Secondary | ICD-10-CM | POA: Diagnosis not present

## 2013-05-08 DIAGNOSIS — E7800 Pure hypercholesterolemia, unspecified: Secondary | ICD-10-CM | POA: Diagnosis not present

## 2013-05-08 DIAGNOSIS — I10 Essential (primary) hypertension: Secondary | ICD-10-CM | POA: Diagnosis not present

## 2013-05-10 DIAGNOSIS — C4441 Basal cell carcinoma of skin of scalp and neck: Secondary | ICD-10-CM | POA: Diagnosis not present

## 2013-05-10 DIAGNOSIS — L570 Actinic keratosis: Secondary | ICD-10-CM | POA: Diagnosis not present

## 2013-05-10 DIAGNOSIS — Z85828 Personal history of other malignant neoplasm of skin: Secondary | ICD-10-CM | POA: Diagnosis not present

## 2013-05-10 DIAGNOSIS — D485 Neoplasm of uncertain behavior of skin: Secondary | ICD-10-CM | POA: Diagnosis not present

## 2013-05-15 DIAGNOSIS — R002 Palpitations: Secondary | ICD-10-CM | POA: Diagnosis not present

## 2013-05-15 DIAGNOSIS — I259 Chronic ischemic heart disease, unspecified: Secondary | ICD-10-CM | POA: Diagnosis not present

## 2013-05-15 DIAGNOSIS — F411 Generalized anxiety disorder: Secondary | ICD-10-CM | POA: Diagnosis not present

## 2013-05-15 DIAGNOSIS — E7800 Pure hypercholesterolemia, unspecified: Secondary | ICD-10-CM | POA: Diagnosis not present

## 2013-05-15 DIAGNOSIS — E559 Vitamin D deficiency, unspecified: Secondary | ICD-10-CM | POA: Diagnosis not present

## 2013-05-15 DIAGNOSIS — I10 Essential (primary) hypertension: Secondary | ICD-10-CM | POA: Diagnosis not present

## 2013-06-01 DIAGNOSIS — L820 Inflamed seborrheic keratosis: Secondary | ICD-10-CM | POA: Diagnosis not present

## 2013-06-01 DIAGNOSIS — D485 Neoplasm of uncertain behavior of skin: Secondary | ICD-10-CM | POA: Diagnosis not present

## 2013-06-01 DIAGNOSIS — C4441 Basal cell carcinoma of skin of scalp and neck: Secondary | ICD-10-CM | POA: Diagnosis not present

## 2013-06-01 DIAGNOSIS — C4442 Squamous cell carcinoma of skin of scalp and neck: Secondary | ICD-10-CM | POA: Diagnosis not present

## 2013-07-11 DIAGNOSIS — R110 Nausea: Secondary | ICD-10-CM | POA: Diagnosis not present

## 2013-07-11 DIAGNOSIS — R1011 Right upper quadrant pain: Secondary | ICD-10-CM | POA: Diagnosis not present

## 2013-07-12 DIAGNOSIS — I739 Peripheral vascular disease, unspecified: Secondary | ICD-10-CM | POA: Diagnosis not present

## 2013-07-12 DIAGNOSIS — I10 Essential (primary) hypertension: Secondary | ICD-10-CM | POA: Diagnosis not present

## 2013-07-12 DIAGNOSIS — R110 Nausea: Secondary | ICD-10-CM | POA: Diagnosis not present

## 2013-07-12 DIAGNOSIS — R195 Other fecal abnormalities: Secondary | ICD-10-CM | POA: Diagnosis not present

## 2013-07-12 DIAGNOSIS — R1011 Right upper quadrant pain: Secondary | ICD-10-CM | POA: Diagnosis not present

## 2013-07-12 DIAGNOSIS — R10819 Abdominal tenderness, unspecified site: Secondary | ICD-10-CM | POA: Diagnosis not present

## 2013-07-12 DIAGNOSIS — E7800 Pure hypercholesterolemia, unspecified: Secondary | ICD-10-CM | POA: Diagnosis not present

## 2013-07-12 DIAGNOSIS — R1084 Generalized abdominal pain: Secondary | ICD-10-CM | POA: Diagnosis not present

## 2013-07-19 DIAGNOSIS — R1011 Right upper quadrant pain: Secondary | ICD-10-CM | POA: Diagnosis not present

## 2013-07-19 DIAGNOSIS — E7800 Pure hypercholesterolemia, unspecified: Secondary | ICD-10-CM | POA: Diagnosis not present

## 2013-07-31 DIAGNOSIS — J209 Acute bronchitis, unspecified: Secondary | ICD-10-CM | POA: Diagnosis not present

## 2013-07-31 DIAGNOSIS — M940 Chondrocostal junction syndrome [Tietze]: Secondary | ICD-10-CM | POA: Diagnosis not present

## 2013-08-14 DIAGNOSIS — H16149 Punctate keratitis, unspecified eye: Secondary | ICD-10-CM | POA: Diagnosis not present

## 2013-08-21 DIAGNOSIS — H4011X0 Primary open-angle glaucoma, stage unspecified: Secondary | ICD-10-CM | POA: Diagnosis not present

## 2013-08-31 DIAGNOSIS — R1033 Periumbilical pain: Secondary | ICD-10-CM | POA: Diagnosis not present

## 2013-08-31 DIAGNOSIS — M545 Low back pain, unspecified: Secondary | ICD-10-CM | POA: Diagnosis not present

## 2013-09-13 DIAGNOSIS — I739 Peripheral vascular disease, unspecified: Secondary | ICD-10-CM | POA: Diagnosis not present

## 2013-10-10 DIAGNOSIS — I10 Essential (primary) hypertension: Secondary | ICD-10-CM | POA: Diagnosis not present

## 2013-10-10 DIAGNOSIS — E7800 Pure hypercholesterolemia, unspecified: Secondary | ICD-10-CM | POA: Diagnosis not present

## 2013-10-17 DIAGNOSIS — F411 Generalized anxiety disorder: Secondary | ICD-10-CM | POA: Diagnosis not present

## 2013-10-17 DIAGNOSIS — K2100 Gastro-esophageal reflux disease with esophagitis, without bleeding: Secondary | ICD-10-CM | POA: Diagnosis not present

## 2013-10-17 DIAGNOSIS — F321 Major depressive disorder, single episode, moderate: Secondary | ICD-10-CM | POA: Diagnosis not present

## 2013-10-17 DIAGNOSIS — M720 Palmar fascial fibromatosis [Dupuytren]: Secondary | ICD-10-CM | POA: Diagnosis not present

## 2013-10-17 DIAGNOSIS — N189 Chronic kidney disease, unspecified: Secondary | ICD-10-CM | POA: Diagnosis not present

## 2013-10-17 DIAGNOSIS — Z23 Encounter for immunization: Secondary | ICD-10-CM | POA: Diagnosis not present

## 2013-10-17 DIAGNOSIS — R1011 Right upper quadrant pain: Secondary | ICD-10-CM | POA: Diagnosis not present

## 2013-10-17 DIAGNOSIS — E7800 Pure hypercholesterolemia, unspecified: Secondary | ICD-10-CM | POA: Diagnosis not present

## 2013-10-27 DIAGNOSIS — R1011 Right upper quadrant pain: Secondary | ICD-10-CM | POA: Diagnosis not present

## 2013-10-27 DIAGNOSIS — R110 Nausea: Secondary | ICD-10-CM | POA: Diagnosis not present

## 2013-10-30 DIAGNOSIS — K838 Other specified diseases of biliary tract: Secondary | ICD-10-CM | POA: Diagnosis not present

## 2013-10-30 DIAGNOSIS — Z9089 Acquired absence of other organs: Secondary | ICD-10-CM | POA: Diagnosis not present

## 2013-10-30 DIAGNOSIS — R1011 Right upper quadrant pain: Secondary | ICD-10-CM | POA: Diagnosis not present

## 2013-11-03 DIAGNOSIS — M720 Palmar fascial fibromatosis [Dupuytren]: Secondary | ICD-10-CM | POA: Diagnosis not present

## 2013-11-28 DIAGNOSIS — L570 Actinic keratosis: Secondary | ICD-10-CM | POA: Diagnosis not present

## 2013-11-28 DIAGNOSIS — Z85828 Personal history of other malignant neoplasm of skin: Secondary | ICD-10-CM | POA: Diagnosis not present

## 2013-11-28 DIAGNOSIS — D485 Neoplasm of uncertain behavior of skin: Secondary | ICD-10-CM | POA: Diagnosis not present

## 2013-11-28 DIAGNOSIS — B079 Viral wart, unspecified: Secondary | ICD-10-CM | POA: Diagnosis not present

## 2013-12-04 ENCOUNTER — Telehealth: Payer: Self-pay | Admitting: Family Medicine

## 2013-12-04 DIAGNOSIS — S93609A Unspecified sprain of unspecified foot, initial encounter: Secondary | ICD-10-CM | POA: Diagnosis not present

## 2013-12-04 NOTE — Telephone Encounter (Signed)
Patient is no longer a patient here and I told her that she may want to see her primary care since Heidi Wallace is only here as needed

## 2013-12-12 DIAGNOSIS — R05 Cough: Secondary | ICD-10-CM | POA: Diagnosis not present

## 2013-12-12 DIAGNOSIS — R059 Cough, unspecified: Secondary | ICD-10-CM | POA: Diagnosis not present

## 2013-12-12 DIAGNOSIS — R079 Chest pain, unspecified: Secondary | ICD-10-CM | POA: Diagnosis not present

## 2013-12-12 DIAGNOSIS — R110 Nausea: Secondary | ICD-10-CM | POA: Diagnosis not present

## 2013-12-12 DIAGNOSIS — K2100 Gastro-esophageal reflux disease with esophagitis, without bleeding: Secondary | ICD-10-CM | POA: Diagnosis not present

## 2014-01-04 DIAGNOSIS — H4011X0 Primary open-angle glaucoma, stage unspecified: Secondary | ICD-10-CM | POA: Diagnosis not present

## 2014-01-24 DIAGNOSIS — I739 Peripheral vascular disease, unspecified: Secondary | ICD-10-CM | POA: Diagnosis not present

## 2014-02-08 DIAGNOSIS — I10 Essential (primary) hypertension: Secondary | ICD-10-CM | POA: Diagnosis not present

## 2014-02-08 DIAGNOSIS — N189 Chronic kidney disease, unspecified: Secondary | ICD-10-CM | POA: Diagnosis not present

## 2014-02-08 DIAGNOSIS — E7800 Pure hypercholesterolemia, unspecified: Secondary | ICD-10-CM | POA: Diagnosis not present

## 2014-02-15 DIAGNOSIS — E7800 Pure hypercholesterolemia, unspecified: Secondary | ICD-10-CM | POA: Diagnosis not present

## 2014-02-15 DIAGNOSIS — K2100 Gastro-esophageal reflux disease with esophagitis, without bleeding: Secondary | ICD-10-CM | POA: Diagnosis not present

## 2014-02-15 DIAGNOSIS — N189 Chronic kidney disease, unspecified: Secondary | ICD-10-CM | POA: Diagnosis not present

## 2014-02-15 DIAGNOSIS — M720 Palmar fascial fibromatosis [Dupuytren]: Secondary | ICD-10-CM | POA: Diagnosis not present

## 2014-02-15 DIAGNOSIS — F411 Generalized anxiety disorder: Secondary | ICD-10-CM | POA: Diagnosis not present

## 2014-04-09 DIAGNOSIS — I739 Peripheral vascular disease, unspecified: Secondary | ICD-10-CM | POA: Diagnosis not present

## 2014-05-07 DIAGNOSIS — Z85828 Personal history of other malignant neoplasm of skin: Secondary | ICD-10-CM | POA: Diagnosis not present

## 2014-05-07 DIAGNOSIS — L570 Actinic keratosis: Secondary | ICD-10-CM | POA: Diagnosis not present

## 2014-06-07 DIAGNOSIS — E780 Pure hypercholesterolemia: Secondary | ICD-10-CM | POA: Diagnosis not present

## 2014-06-07 DIAGNOSIS — I10 Essential (primary) hypertension: Secondary | ICD-10-CM | POA: Diagnosis not present

## 2014-06-07 DIAGNOSIS — E559 Vitamin D deficiency, unspecified: Secondary | ICD-10-CM | POA: Diagnosis not present

## 2014-06-07 DIAGNOSIS — N189 Chronic kidney disease, unspecified: Secondary | ICD-10-CM | POA: Diagnosis not present

## 2014-06-11 DIAGNOSIS — Z23 Encounter for immunization: Secondary | ICD-10-CM | POA: Diagnosis not present

## 2014-06-14 DIAGNOSIS — I10 Essential (primary) hypertension: Secondary | ICD-10-CM | POA: Diagnosis not present

## 2014-06-14 DIAGNOSIS — R05 Cough: Secondary | ICD-10-CM | POA: Diagnosis not present

## 2014-06-14 DIAGNOSIS — N189 Chronic kidney disease, unspecified: Secondary | ICD-10-CM | POA: Diagnosis not present

## 2014-06-14 DIAGNOSIS — Z1389 Encounter for screening for other disorder: Secondary | ICD-10-CM | POA: Diagnosis not present

## 2014-06-14 DIAGNOSIS — I259 Chronic ischemic heart disease, unspecified: Secondary | ICD-10-CM | POA: Diagnosis not present

## 2014-06-14 DIAGNOSIS — E780 Pure hypercholesterolemia: Secondary | ICD-10-CM | POA: Diagnosis not present

## 2014-06-14 DIAGNOSIS — E559 Vitamin D deficiency, unspecified: Secondary | ICD-10-CM | POA: Diagnosis not present

## 2014-07-03 DIAGNOSIS — I739 Peripheral vascular disease, unspecified: Secondary | ICD-10-CM | POA: Diagnosis not present

## 2014-07-05 DIAGNOSIS — H4011X0 Primary open-angle glaucoma, stage unspecified: Secondary | ICD-10-CM | POA: Diagnosis not present

## 2014-07-17 DIAGNOSIS — G4762 Sleep related leg cramps: Secondary | ICD-10-CM | POA: Diagnosis not present

## 2014-07-17 DIAGNOSIS — R1084 Generalized abdominal pain: Secondary | ICD-10-CM | POA: Diagnosis not present

## 2014-08-08 DIAGNOSIS — M940 Chondrocostal junction syndrome [Tietze]: Secondary | ICD-10-CM | POA: Diagnosis not present

## 2014-09-18 DIAGNOSIS — I739 Peripheral vascular disease, unspecified: Secondary | ICD-10-CM | POA: Diagnosis not present

## 2014-11-12 DIAGNOSIS — L820 Inflamed seborrheic keratosis: Secondary | ICD-10-CM | POA: Diagnosis not present

## 2014-11-12 DIAGNOSIS — D485 Neoplasm of uncertain behavior of skin: Secondary | ICD-10-CM | POA: Diagnosis not present

## 2014-11-12 DIAGNOSIS — L570 Actinic keratosis: Secondary | ICD-10-CM | POA: Diagnosis not present

## 2014-11-12 DIAGNOSIS — Z85828 Personal history of other malignant neoplasm of skin: Secondary | ICD-10-CM | POA: Diagnosis not present

## 2014-11-28 DIAGNOSIS — H40023 Open angle with borderline findings, high risk, bilateral: Secondary | ICD-10-CM | POA: Diagnosis not present

## 2014-12-05 DIAGNOSIS — I739 Peripheral vascular disease, unspecified: Secondary | ICD-10-CM | POA: Diagnosis not present

## 2014-12-06 DIAGNOSIS — I10 Essential (primary) hypertension: Secondary | ICD-10-CM | POA: Diagnosis not present

## 2014-12-06 DIAGNOSIS — N189 Chronic kidney disease, unspecified: Secondary | ICD-10-CM | POA: Diagnosis not present

## 2014-12-06 DIAGNOSIS — E559 Vitamin D deficiency, unspecified: Secondary | ICD-10-CM | POA: Diagnosis not present

## 2014-12-06 DIAGNOSIS — E780 Pure hypercholesterolemia: Secondary | ICD-10-CM | POA: Diagnosis not present

## 2014-12-13 DIAGNOSIS — I10 Essential (primary) hypertension: Secondary | ICD-10-CM | POA: Diagnosis not present

## 2014-12-13 DIAGNOSIS — N189 Chronic kidney disease, unspecified: Secondary | ICD-10-CM | POA: Diagnosis not present

## 2014-12-13 DIAGNOSIS — R05 Cough: Secondary | ICD-10-CM | POA: Diagnosis not present

## 2014-12-13 DIAGNOSIS — E780 Pure hypercholesterolemia: Secondary | ICD-10-CM | POA: Diagnosis not present

## 2014-12-13 DIAGNOSIS — I259 Chronic ischemic heart disease, unspecified: Secondary | ICD-10-CM | POA: Diagnosis not present

## 2014-12-18 DIAGNOSIS — R1031 Right lower quadrant pain: Secondary | ICD-10-CM | POA: Diagnosis not present

## 2014-12-18 DIAGNOSIS — R911 Solitary pulmonary nodule: Secondary | ICD-10-CM | POA: Diagnosis not present

## 2015-01-16 DIAGNOSIS — H35411 Lattice degeneration of retina, right eye: Secondary | ICD-10-CM | POA: Diagnosis not present

## 2015-01-16 DIAGNOSIS — H11152 Pinguecula, left eye: Secondary | ICD-10-CM | POA: Diagnosis not present

## 2015-01-16 DIAGNOSIS — H18529 Epithelial (juvenile) corneal dystrophy, unspecified eye: Secondary | ICD-10-CM | POA: Insufficient documentation

## 2015-01-16 DIAGNOSIS — H1852 Epithelial (juvenile) corneal dystrophy: Secondary | ICD-10-CM | POA: Diagnosis not present

## 2015-01-16 DIAGNOSIS — Z961 Presence of intraocular lens: Secondary | ICD-10-CM | POA: Insufficient documentation

## 2015-02-04 DIAGNOSIS — L03031 Cellulitis of right toe: Secondary | ICD-10-CM | POA: Diagnosis not present

## 2015-02-26 DIAGNOSIS — I739 Peripheral vascular disease, unspecified: Secondary | ICD-10-CM | POA: Diagnosis not present

## 2015-03-11 DIAGNOSIS — H16143 Punctate keratitis, bilateral: Secondary | ICD-10-CM | POA: Diagnosis not present

## 2015-03-26 DIAGNOSIS — R21 Rash and other nonspecific skin eruption: Secondary | ICD-10-CM | POA: Diagnosis not present

## 2015-04-04 DIAGNOSIS — E7800 Pure hypercholesterolemia, unspecified: Secondary | ICD-10-CM | POA: Diagnosis not present

## 2015-04-04 DIAGNOSIS — I10 Essential (primary) hypertension: Secondary | ICD-10-CM | POA: Diagnosis not present

## 2015-04-12 DIAGNOSIS — I10 Essential (primary) hypertension: Secondary | ICD-10-CM | POA: Diagnosis not present

## 2015-04-12 DIAGNOSIS — R05 Cough: Secondary | ICD-10-CM | POA: Diagnosis not present

## 2015-04-12 DIAGNOSIS — I259 Chronic ischemic heart disease, unspecified: Secondary | ICD-10-CM | POA: Diagnosis not present

## 2015-04-12 DIAGNOSIS — Z23 Encounter for immunization: Secondary | ICD-10-CM | POA: Diagnosis not present

## 2015-04-12 DIAGNOSIS — N183 Chronic kidney disease, stage 3 (moderate): Secondary | ICD-10-CM | POA: Diagnosis not present

## 2015-04-12 DIAGNOSIS — Z13220 Encounter for screening for lipoid disorders: Secondary | ICD-10-CM | POA: Diagnosis not present

## 2015-05-07 DIAGNOSIS — I739 Peripheral vascular disease, unspecified: Secondary | ICD-10-CM | POA: Diagnosis not present

## 2015-05-13 DIAGNOSIS — Z85828 Personal history of other malignant neoplasm of skin: Secondary | ICD-10-CM | POA: Diagnosis not present

## 2015-05-13 DIAGNOSIS — L570 Actinic keratosis: Secondary | ICD-10-CM | POA: Diagnosis not present

## 2015-06-20 DIAGNOSIS — H401131 Primary open-angle glaucoma, bilateral, mild stage: Secondary | ICD-10-CM | POA: Diagnosis not present

## 2015-06-30 DIAGNOSIS — Z9289 Personal history of other medical treatment: Secondary | ICD-10-CM

## 2015-06-30 HISTORY — DX: Personal history of other medical treatment: Z92.89

## 2015-07-15 DIAGNOSIS — R918 Other nonspecific abnormal finding of lung field: Secondary | ICD-10-CM | POA: Diagnosis not present

## 2015-07-15 DIAGNOSIS — Z7982 Long term (current) use of aspirin: Secondary | ICD-10-CM | POA: Diagnosis not present

## 2015-07-15 DIAGNOSIS — E785 Hyperlipidemia, unspecified: Secondary | ICD-10-CM | POA: Diagnosis not present

## 2015-07-15 DIAGNOSIS — Z79899 Other long term (current) drug therapy: Secondary | ICD-10-CM | POA: Diagnosis not present

## 2015-07-15 DIAGNOSIS — E784 Other hyperlipidemia: Secondary | ICD-10-CM | POA: Diagnosis not present

## 2015-07-15 DIAGNOSIS — Z8249 Family history of ischemic heart disease and other diseases of the circulatory system: Secondary | ICD-10-CM | POA: Diagnosis not present

## 2015-07-15 DIAGNOSIS — I10 Essential (primary) hypertension: Secondary | ICD-10-CM | POA: Diagnosis not present

## 2015-07-15 DIAGNOSIS — I200 Unstable angina: Secondary | ICD-10-CM | POA: Diagnosis not present

## 2015-07-15 DIAGNOSIS — M79602 Pain in left arm: Secondary | ICD-10-CM | POA: Diagnosis not present

## 2015-07-15 DIAGNOSIS — K219 Gastro-esophageal reflux disease without esophagitis: Secondary | ICD-10-CM | POA: Diagnosis not present

## 2015-07-15 DIAGNOSIS — R110 Nausea: Secondary | ICD-10-CM | POA: Diagnosis not present

## 2015-07-15 DIAGNOSIS — R079 Chest pain, unspecified: Secondary | ICD-10-CM | POA: Diagnosis not present

## 2015-07-16 ENCOUNTER — Telehealth: Payer: Self-pay

## 2015-07-16 DIAGNOSIS — I209 Angina pectoris, unspecified: Secondary | ICD-10-CM | POA: Diagnosis not present

## 2015-07-16 NOTE — Telephone Encounter (Signed)
S/w Dr. Quintin Alto at [facility] who states pt was seen at her local hospital in [place] to r/o MI. She was sent home w/instructions to f/u with lexi myoview. Pt is unsure if she wants this procedure. She last saw Dr. Fletcher Anon in 2014 and would like appt w/him for further advice. Appt scheduled Jan 18, 11am. Notified [place] in the office of upcoming appt. Gabriel Cirri contacted pt who is agreeable w/appt.

## 2015-07-17 ENCOUNTER — Ambulatory Visit (INDEPENDENT_AMBULATORY_CARE_PROVIDER_SITE_OTHER): Payer: Medicare Other | Admitting: Cardiovascular Disease

## 2015-07-17 ENCOUNTER — Ambulatory Visit: Payer: Medicare Other | Admitting: Cardiovascular Disease

## 2015-07-17 ENCOUNTER — Encounter: Payer: Self-pay | Admitting: Cardiovascular Disease

## 2015-07-17 VITALS — BP 130/68 | HR 72 | Ht 63.0 in | Wt 127.5 lb

## 2015-07-17 DIAGNOSIS — R079 Chest pain, unspecified: Secondary | ICD-10-CM | POA: Diagnosis not present

## 2015-07-17 DIAGNOSIS — I25118 Atherosclerotic heart disease of native coronary artery with other forms of angina pectoris: Secondary | ICD-10-CM

## 2015-07-17 DIAGNOSIS — E785 Hyperlipidemia, unspecified: Secondary | ICD-10-CM | POA: Diagnosis not present

## 2015-07-17 DIAGNOSIS — I493 Ventricular premature depolarization: Secondary | ICD-10-CM

## 2015-07-17 MED ORDER — ROSUVASTATIN CALCIUM 5 MG PO TABS: 5.0000 mg | ORAL_TABLET | Freq: Every day | ORAL | Status: DC

## 2015-07-17 NOTE — Progress Notes (Signed)
Primary care physician:  Dr. Quintin Alto  HPI  This is a 80 year old female who is here today for evaluation of left arm pain. She is well-known to me with history of palpitations due to PVCs with occasional ventricular bigeminy, and mild to moderate nonobstructive coronary artery disease. Cardiac catheterization was performed in 2013 after an abnormal stress test and showed mild to moderate nonobstructive coronary artery disease with heavy calcifications. Ejection fraction was normal.  PVCs resolved with metoprolol.  She has known history of severe hyperlipidemia. She used to be on atorvastatin but that was discontinued due to muscle cramps especially in the chest area. The patient has been feeling very well with no exertional symptoms. However, on Monday afternoon, she started having left elbow pain which was localized to that area with no radiation with some tingling in her fingers. This was associated with nausea and lasted for about 4 hours and thus she went to the emergency room at [facility]. Symptoms improved with nitroglycerin patch. She was observed overnight with negative troponin. A stress test was suggested but the patient wanted to follow-up with [place] for discussion. She denies any recent exertional symptoms. She did bump her elbow over the weekend but she did not have pain immediately. She denies any chest, neck or jaw pain. She was started on Imdur. She is feeling better overall.  Allergies  Allergen Reactions  . Ceclor [Cefaclor]   . Cephalosporins     REACTION: rash  . Clindamycin/Lincomycin     c-diff   . Keflex [Cephalexin]   . Levaquin [Levofloxacin In D5w]   . Morphine     REACTION: mother was,so pt ont take it  . Penicillins     REACTION: rash     Current Outpatient Prescriptions on File Prior to Visit  Medication Sig Dispense Refill  . aspirin 81 MG tablet Take 81 mg by mouth daily.    . Cholecalciferol (VITAMIN D-3) 1000 UNITS CAPS Take 1 capsule by mouth  daily.    Marland Kitchen esomeprazole (NEXIUM) 40 MG capsule Take 40 mg by mouth daily before breakfast.      . gabapentin (NEURONTIN) 300 MG capsule Take 300 mg by mouth daily. At bedtime    . metoprolol succinate (TOPROL-XL) 50 MG 24 hr tablet Take 25 mg by mouth daily. Take with or immediately following a meal.    . olmesartan-hydrochlorothiazide (BENICAR HCT) 40-12.5 MG per tablet Take 0.5 tablets by mouth daily.     . travoprost, benzalkonium, (TRAVATAN Z) 0.004 % ophthalmic solution Place 1 drop into both eyes at bedtime.      . Wheat Dextrin (BENEFIBER DRINK MIX PO) Take 1 Package by mouth daily as needed.      No current facility-administered medications on file prior to visit.     Past Medical History  Diagnosis Date  . Chest pain   . GERD (gastroesophageal reflux disease)   . Hypertension   . Post concussion syndrome   . PVC's (premature ventricular contractions)     PVCs and ventricular bigeminy with abnormal nuclear test  . Hyperlipidemia   . Coronary artery disease     cardiac cath 08/2011: heavily calcified coronary arteries without obstructive disease. 40% proximal LAD, 40% in OMs.      Past Surgical History  Procedure Laterality Date  . Gallbladder surgery  2003  . Tubal ligation  1974  . Breast cyst excision  benign right breast    1982  . Rotator cuff repair  right 1989  .  Cardiac catheterization  07/2011    [facility]; [place]     Family History  Problem Relation Age of Onset  . Heart disease Father   . Heart disease Mother      Social History   Social History  . Marital Status: Married    Spouse Name: N/A  . Number of Children: N/A  . Years of Education: N/A   Occupational History  . former Therapist, sports    Social History Main Topics  . Smoking status: Former Smoker -- 1.00 packs/day for 16 years    Types: Cigarettes    Quit date: 06/29/1972  . Smokeless tobacco: Never Used  . Alcohol Use: No  . Drug Use: No  . Sexual Activity: Not on file   Other Topics Concern  .  Not on file   Social History Narrative     PHYSICAL EXAM   BP 130/68 mmHg  Pulse 72  Ht 5\' 3"  (1.6 m)  Wt 127 lb 8 oz (57.834 kg)  BMI 22.59 kg/m2 Constitutional: She is oriented to person, place, and time. She appears well-developed and well-nourished. No distress.  HENT: No nasal discharge.  Head: Normocephalic and atraumatic.  Eyes: Pupils are equal and round.  Neck: Normal range of motion. Neck supple. No JVD present. No thyromegaly present.  Cardiovascular: Normal rate, regular rhythm, normal heart sounds. Exam reveals no gallop and no friction rub. No murmur heard.  Pulmonary/Chest: Effort normal and breath sounds normal. No stridor. No respiratory distress. She has no wheezes. She has no rales. She exhibits no tenderness.  Abdominal: Soft. Bowel sounds are normal. She exhibits no distension. There is no tenderness. There is no rebound and no guarding.  Musculoskeletal: Normal range of motion. She exhibits no edema and no tenderness.  Neurological: She is alert and oriented to person, place, and time. Coordination normal.  Skin: Skin is warm and dry. No rash noted. She is not diaphoretic. No erythema. No pallor.  Psychiatric: She has a normal mood and affect. Her behavior is normal. Judgment and thought content normal.   EKG: Normal sinus rhythm with   ASSESSMENT AND PLAN

## 2015-07-17 NOTE — Patient Instructions (Addendum)
Medication Instructions:  Your physician has recommended you make the following change in your medication:  START taking crestor 5mg  once daily   Labwork: none  Testing/Procedures: Your physician has requested that you have a lexiscan myoview. For further information please visit [email or web address]. Please follow instruction sheet, as given.  Elizabeth  Your caregiver has ordered a Stress Test with nuclear imaging. The purpose of this test is to evaluate the blood supply to your heart muscle. This procedure is referred to as a "Non-Invasive Stress Test." This is because other than having an IV started in your vein, nothing is inserted or "invades" your body. Cardiac stress tests are done to find areas of poor blood flow to the heart by determining the extent of coronary artery disease (CAD). Some patients exercise on a treadmill, which naturally increases the blood flow to your heart, while others who are  unable to walk on a treadmill due to physical limitations have a pharmacologic/chemical stress agent called Lexiscan . This medicine will mimic walking on a treadmill by temporarily increasing your coronary blood flow.   Please note: these test may take anywhere between 2-4 hours to complete  PLEASE REPORT TO [place] TO GO  Date of Procedure: Thursday, January 19  Arrival Time for Procedure: 7:45am  Instructions regarding medication:    _xx___:  Hold metoprolol night before procedure and morning of procedure  __xx__:  Hold other medications as follows: Do not take Imdur the night before or morning of your procedure  PLEASE NOTIFY THE OFFICE AT LEAST 24 HOURS IN ADVANCE IF YOU ARE UNABLE TO KEEP YOUR APPOINTMENT.  [phone] AND  PLEASE NOTIFY NUCLEAR MEDICINE AT [facility] AT LEAST 24 HOURS IN ADVANCE IF YOU ARE UNABLE TO KEEP YOUR APPOINTMENT. [phone]  How to prepare for your Myoview test:   Do  not eat or drink after midnight  No caffeine for 24 hours prior to test  No smoking 24 hours prior to test.  Your medication may be taken with water.  If your doctor stopped a medication because of this test, do not take that medication.  Ladies, please do not wear dresses.  Skirts or pants are appropriate. Please wear a short sleeve shirt.  No perfume, cologne or lotion.  Wear comfortable walking shoes. No heels!            Follow-Up: Your physician wants you to follow-up in: six months with Dr. Fletcher Anon.  You will receive a reminder letter in the mail two months in advance. If you don't receive a letter, please call our office to schedule the follow-up appointment.   Any Other Special Instructions Will Be Listed Below (If Applicable).     If you need a refill on your cardiac medications before your next appointment, please call your pharmacy.  Cardiac Nuclear Scanning A cardiac nuclear scan is used to check your heart for problems, such as the following:  A portion of the heart is not getting enough blood.  Part of the heart muscle has died, which happens with a heart attack.  The heart wall is not working normally.  In this test, a radioactive dye (tracer) is injected into your bloodstream. After the tracer has traveled to your heart, a scanning device is used to measure how much of the tracer is absorbed by or distributed to various areas of your heart. LET [facility] CARE PROVIDER KNOW ABOUT:  Any allergies you  have.  All medicines you are taking, including vitamins, herbs, eye drops, creams, and over-the-counter medicines.  Previous problems you or members of your family have had with the use of anesthetics.  Any blood disorders you have.  Previous surgeries you have had.  Medical conditions you have.  RISKS AND COMPLICATIONS Generally, this is a safe procedure. However, as with any procedure, problems can occur. Possible problems include:   Serious  chest pain.  Rapid heartbeat.  Sensation of warmth in your chest. This usually passes quickly. BEFORE THE PROCEDURE Ask your health care provider about changing or stopping your regular medicines. PROCEDURE This procedure is usually done at a hospital and takes 2-4 hours.  An IV tube is inserted into one of your veins.  Your health care provider will inject a small amount of radioactive tracer through the tube.  You will then wait for 20-40 minutes while the tracer travels through your bloodstream.  You will lie down on an exam table so images of your heart can be taken. Images will be taken for about 15-20 minutes.  You will exercise on a treadmill or stationary bike. While you exercise, your heart activity will be monitored with an electrocardiogram (ECG), and your blood pressure will be checked.  If you are unable to exercise, you may be given a medicine to make your heart beat faster.  When blood flow to your heart has peaked, tracer will again be injected through the IV tube.  After 20-40 minutes, you will get back on the exam table and have more images taken of your heart.  When the procedure is over, your IV tube will be removed. AFTER THE PROCEDURE  You will likely be able to leave shortly after the test. Unless your health care provider tells you otherwise, you may return to your normal schedule, including diet, activities, and medicines.  Make sure you find out how and when you will get your test results.   This information is not intended to replace advice given to you by your health care provider. Make sure you discuss any questions you have with your health care provider.   Document Released: 07/10/2004 Document Revised: 06/20/2013 Document Reviewed: 05/24/2013 Elsevier Interactive Patient Education 2016 Elsevier Inc. Rosuvastatin Tablets What is this medicine? ROSUVASTATIN (roe SOO va sta tin) is known as a HMG-CoA reductase inhibitor or 'statin'. It lowers  cholesterol and triglycerides in the blood. This drug may also reduce the risk of heart attack, stroke, or other health problems in patients with risk factors for heart disease. Diet and lifestyle changes are often used with this drug. This medicine may be used for other purposes; ask your health care provider or pharmacist if you have questions. What should I tell my health care provider before I take this medicine? They need to know if you have any of these conditions: -frequently drink alcoholic beverages -kidney disease -liver disease -muscle aches or weakness -other medical condition -an unusual or allergic reaction to rosuvastatin, other medicines, foods, dyes, or preservatives -pregnant or trying to get pregnant -breast-feeding How should I use this medicine? Take this medicine by mouth with a glass of water. Follow the directions on the prescription label. Do not cut, crush or chew this medicine. You can take this medicine with or without food. Take your doses at regular intervals. Do not take your medicine more often than directed. Talk to your pediatrician regarding the use of this medicine in children. While this drug may be prescribed for children as  young as 62 years old for selected conditions, precautions do apply. Overdosage: If you think you have taken too much of this medicine contact a poison control center or emergency room at once. NOTE: This medicine is only for you. Do not share this medicine with others. What if I miss a dose? If you miss a dose, take it as soon as you can. Do not take 2 doses within 12 hours of each other. If there are less than 12 hours until your next dose, take only that dose. Do not take double or extra doses. What may interact with this medicine? Do not take this medicine with any of the following medications: -herbal medicines like red yeast rice This medicine may also interact with the following medications: -alcohol -antacids containing  aluminum hydroxide or magnesium hydroxide -cyclosporine -other medicines for high cholesterol -some medicines for HIV infection -warfarin This list may not describe all possible interactions. Give your health care provider a list of all the medicines, herbs, non-prescription drugs, or dietary supplements you use. Also tell them if you smoke, drink alcohol, or use illegal drugs. Some items may interact with your medicine. What should I watch for while using this medicine? Visit your doctor or health care professional for regular check-ups. You may need regular tests to make sure your liver is working properly. Tell your doctor or health care professional right away if you get any unexplained muscle pain, tenderness, or weakness, especially if you also have a fever and tiredness. Your doctor or health care professional may tell you to stop taking this medicine if you develop muscle problems. If your muscle problems do not go away after stopping this medicine, contact your health care professional. This medicine may affect blood sugar levels. If you have diabetes, check with your doctor or health care professional before you change your diet or the dose of your diabetic medicine. Avoid taking antacids containing aluminum, calcium or magnesium within 2 hours of taking this medicine. This drug is only part of a total heart-health program. Your doctor or a dietician can suggest a low-cholesterol and low-fat diet to help. Avoid alcohol and smoking, and keep a proper exercise schedule. Do not use this drug if you are pregnant or breast-feeding. Serious side effects to an unborn child or to an infant are possible. Talk to your doctor or pharmacist for more information. What side effects may I notice from receiving this medicine? Side effects that you should report to your doctor or health care professional as soon as possible: -allergic reactions like skin rash, itching or hives, swelling of the face, lips, or  tongue -dark urine -fever -joint pain -muscle cramps, pain -redness, blistering, peeling or loosening of the skin, including inside the mouth -trouble passing urine or change in the amount of urine -unusually weak or tired -yellowing of the eyes or skin Side effects that usually do not require medical attention (report to your doctor or health care professional if they continue or are bothersome): -constipation -heartburn -nausea -stomach gas, pain, upset This list may not describe all possible side effects. Call your doctor for medical advice about side effects. You may report side effects to FDA at 1-800-FDA-1088. Where should I keep my medicine? Keep out of the reach of children. Store at room temperature between 20 and 25 degrees C (68 and 77 degrees F). Keep container tightly closed (protect from moisture). Throw away any unused medicine after the expiration date. NOTE: This sheet is a summary. It may not cover  all possible information. If you have questions about this medicine, talk to your doctor, pharmacist, or health care provider.    2016, Elsevier/Gold Standard. (2014-11-29 13:33:08)

## 2015-07-17 NOTE — Assessment & Plan Note (Signed)
These have been well-controlled with metoprolol.

## 2015-07-17 NOTE — Assessment & Plan Note (Signed)
The patient has known history of heavily calcified coronary arteries with mild to moderate nonobstructive coronary artery disease involving the LAD and left circumflex. I think the left elbow pain is likely musculoskeletal and much less likely to be angina equivalent. However, given her known history of coronary artery disease and risk factors, I requested a treadmill nuclear stress test.

## 2015-07-17 NOTE — Assessment & Plan Note (Signed)
Most recent lipid profile in October showed a total cholesterol of 270, triglyceride of 64, HDL of 85 and an LDL of 172. She was started on Zetia at that time given that she did not tolerate atorvastatin. Given the presence of coronary artery disease, I elected to start her on small dose rosuvastatin 5 mg once daily in addition to Zetia. Hopefully she can tolerate this. She has a follow-up appointment with Dr. Quintin Alto in February and she can get follow-up labs at that time.

## 2015-07-18 ENCOUNTER — Encounter
Admission: RE | Admit: 2015-07-18 | Discharge: 2015-07-18 | Disposition: A | Discharge status: Home / Self Care | Payer: Medicare Other | Source: Ambulatory Visit | Attending: Cardiovascular Disease | Admitting: Cardiovascular Disease

## 2015-07-18 DIAGNOSIS — R079 Chest pain, unspecified: Secondary | ICD-10-CM | POA: Insufficient documentation

## 2015-07-18 LAB — NM MYOCAR MULTI W/SPECT W/WALL MOTION / EF
CHL CUP NUCLEAR SSS: 3
CSEPED: 2 min
CSEPEW: 4.6 METS
CSEPPHR: 141 {beats}/min
LV dias vol: 48 mL
LVSYSVOL: 13 mL
NUC STRESS TID: 0.97
Percent HR: 98 %
Rest HR: 86 {beats}/min
SDS: 3
SRS: 3

## 2015-07-18 MED ORDER — REGADENOSON 0.4 MG/5ML IV SOLN: 0.4000 mg | Freq: Once | INTRAVENOUS | Status: DC

## 2015-07-18 MED ORDER — TECHNETIUM TC 99M SESTAMIBI - CARDIOLITE
10.0000 | Freq: Once | INTRAVENOUS | Status: AC | PRN
Start: 2015-07-18 — End: 2015-07-18
  Administered 2015-07-18: 08:00:00 12.02 via INTRAVENOUS

## 2015-07-18 MED ORDER — TECHNETIUM TC 99M SESTAMIBI - CARDIOLITE
33.2640 | Freq: Once | INTRAVENOUS | Status: AC | PRN
  Administered 2015-07-18: 09:00:00 33.264 via INTRAVENOUS

## 2015-07-19 ENCOUNTER — Telehealth: Payer: Self-pay

## 2015-07-19 NOTE — Telephone Encounter (Signed)
S/w pt who called back to review results of lexi myoview. Pt verbalized understanding of results with no further questions.

## 2015-07-23 ENCOUNTER — Encounter: Payer: Medicare Other | Admitting: Adult Health

## 2015-07-24 ENCOUNTER — Ambulatory Visit: Payer: Medicare Other | Admitting: Physician Assistant

## 2015-07-29 DIAGNOSIS — M25775 Osteophyte, left foot: Secondary | ICD-10-CM | POA: Diagnosis not present

## 2015-07-29 DIAGNOSIS — M25579 Pain in unspecified ankle and joints of unspecified foot: Secondary | ICD-10-CM | POA: Diagnosis not present

## 2015-07-29 DIAGNOSIS — M79672 Pain in left foot: Secondary | ICD-10-CM | POA: Diagnosis not present

## 2015-08-15 DIAGNOSIS — R072 Precordial pain: Secondary | ICD-10-CM | POA: Diagnosis not present

## 2015-08-15 DIAGNOSIS — R05 Cough: Secondary | ICD-10-CM | POA: Diagnosis not present

## 2015-08-21 ENCOUNTER — Telehealth: Payer: Self-pay | Admitting: Cardiovascular Disease

## 2015-08-21 ENCOUNTER — Encounter: Payer: Self-pay | Admitting: Physician Assistant

## 2015-08-21 ENCOUNTER — Ambulatory Visit (INDEPENDENT_AMBULATORY_CARE_PROVIDER_SITE_OTHER): Payer: Medicare Other | Admitting: Physician Assistant

## 2015-08-21 VITALS — BP 120/78 | HR 79 | Ht 63.0 in | Wt 125.8 lb

## 2015-08-21 DIAGNOSIS — I10 Essential (primary) hypertension: Secondary | ICD-10-CM

## 2015-08-21 DIAGNOSIS — R079 Chest pain, unspecified: Secondary | ICD-10-CM

## 2015-08-21 DIAGNOSIS — R002 Palpitations: Secondary | ICD-10-CM | POA: Diagnosis not present

## 2015-08-21 DIAGNOSIS — E785 Hyperlipidemia, unspecified: Secondary | ICD-10-CM

## 2015-08-21 DIAGNOSIS — I2510 Atherosclerotic heart disease of native coronary artery without angina pectoris: Secondary | ICD-10-CM | POA: Diagnosis not present

## 2015-08-21 MED ORDER — RANOLAZINE ER 500 MG PO TB12: 500.0000 mg | ORAL_TABLET | Freq: Two times a day (BID) | ORAL | Status: DC

## 2015-08-21 MED ORDER — ISOSORBIDE MONONITRATE ER 60 MG PO TB24: 60.0000 mg | ORAL_TABLET | Freq: Every day | ORAL | Status: DC

## 2015-08-21 NOTE — Progress Notes (Signed)
Cardiology Office Note Date:  08/21/2015  Patient ID:  Heidi Wallace, Heidi Wallace [birth year], MRN [id number] PCP:  Manon Hilding, MD  Cardiologist:  Dr. Fletcher Anon, MD    Chief Complaint: Chest pain  History of Present Illness: Heidi Wallace is a 80 y.o. female with history of palpitations due to PVCs with occasional ventricular bigeminy, mild to moderate nonobstructive CAD, HLD intolerant to Lipitor who presents for evaluation of chest pain. Cardiac catheterization was performed in 2013 after an abnormal stress test and showed left main moderately calcified without evidence of obstructive disease, LAD heavily calficied in the proximal and midsegment. There was diffuse 40% stenosis proximally, 20% midsegment diffuse disease and 30% tubular stenosis distally. D1 small with mild diffuse disease, D2 medium with minor irregs, D3 small in size. LCx with normal in size and codominant. The vessel had minor irregs throughout without evidence of active disease. OM1 was medium in size with 40% ostial disease, OM2 medium in size with 40-50% proximal disease, OM3 with minor irregs. RCA had an anterior downward takeoff. It was heavily calcified. It was nonselectively engaged with an AR-1 catheter. It had minor irregs without evidence of obstructive disease. PDA was small in size. PLA was normal in size with minor irregs. LVEF was estimated at 55%, no significant MR. The ascending aorta appeared to be mildly dilated. She was advised to continue aggressive medical therapy. Her PVCs resolved with metoprolol. She used to be on atorvastatin but that was discontinued due to muscle cramps especially in the chest area. At her last follow up on 07/17/2015 she had been feeling very well with no exertional symptoms. However, just a few days prior to that appointment, she started having left elbow pain which was localized to that area with no radiation with some tingling in her fingers. This was associated with nausea and lasted for  about 4 hours and thus she went to the emergency room at [facility]. Symptoms improved with nitroglycerin patch. She was observed overnight with negative troponin. A stress test was suggested but the patient wanted to follow-up with [place] for discussion. She denied any recent exertional symptoms. She did bump her elbow over the weekend but she did not have pain immediately. She denies any chest, neck or jaw pain. She was started on Imdur. At her follow up on 07/17/15 she was symptom free. She was scheduled for a treadmill Myoview which she completed on 07/18/15 and showed no significant ischemia, normal wall motion, EF estimated at 67%, no EKG changes concerning for ischemia, rare PACs and PVCs noted. She was hypertensive with exercise and had a resting blood pressure of 170/96. Overall, this was a low risk scan. Patient called on 08/21/15 with complaints of chest pain.   Patient was doing some ironing on 2/21 when she developed chest pressure that lasted throughout the afternoon. Pain was no associated with any SOB, diaphoresis, nausea, vomiting, palpitations, dizziness, presyncope, or syncope. Pain did not seem to be pleuritic in nature. She ultimately took one SL NTG with resolution of her pain and she has been pain free since. She recently began walking in her neighborhood with her husband this past week and is walking 1 mile daily. At times she is a little SOB with this, though is otherwise tolerating the walk without any symptoms concerning for angina. She is currently without any chest pain.    Past Medical History  Diagnosis Date  . Chest pain   . GERD (gastroesophageal reflux disease)   . Hypertension   .  Post concussion syndrome   . PVC's (premature ventricular contractions)     PVCs and ventricular bigeminy with abnormal nuclear test  . Hyperlipidemia   . Coronary artery disease     cardiac cath 08/2011: heavily calcified coronary arteries without obstructive disease. 40% proximal LAD, 40%  in OMs.     Past Surgical History  Procedure Laterality Date  . Gallbladder surgery  2003  . Tubal ligation  1974  . Breast cyst excision  benign right breast    1982  . Rotator cuff repair  right 1989  . Cardiac catheterization  07/2011    [facility]; [place]    Current Outpatient Prescriptions  Medication Sig Dispense Refill  . aspirin 81 MG tablet Take 81 mg by mouth daily.    . Cholecalciferol (VITAMIN D-3) 1000 UNITS CAPS Take 1 capsule by mouth daily.    Marland Kitchen esomeprazole (NEXIUM) 40 MG capsule Take 40 mg by mouth daily before breakfast.      . ezetimibe (ZETIA) 10 MG tablet Take 10 mg by mouth daily.    Marland Kitchen gabapentin (NEURONTIN) 300 MG capsule Take 300 mg by mouth daily. At bedtime    . isosorbide mononitrate (IMDUR) 60 MG 24 hr tablet Take 1 tablet (60 mg total) by mouth daily. 30 tablet 3  . metoprolol succinate (TOPROL-XL) 50 MG 24 hr tablet Take 25 mg by mouth daily. Take with or immediately following a meal.    . olmesartan-hydrochlorothiazide (BENICAR HCT) 40-12.5 MG per tablet Take 0.5 tablets by mouth daily.     . rosuvastatin (CRESTOR) 5 MG tablet Take 1 tablet (5 mg total) by mouth daily. 30 tablet 5  . travoprost, benzalkonium, (TRAVATAN Z) 0.004 % ophthalmic solution Place 1 drop into both eyes at bedtime.      . Wheat Dextrin (BENEFIBER DRINK MIX PO) Take 1 Package by mouth daily as needed.     . ranolazine (RANEXA) 500 MG 12 hr tablet Take 1 tablet (500 mg total) by mouth 2 (two) times daily. 60 tablet 3   No current facility-administered medications for this visit.    Allergies:   Atorvastatin; Ceclor; Cephalosporins; Clindamycin/lincomycin; Keflex; Levaquin; Morphine; and Penicillins   Social History:  The patient  reports that she quit smoking about 43 years ago. Her smoking use included Cigarettes. She has a 16 pack-year smoking history. She has never used smokeless tobacco. She reports that she does not drink alcohol or use illicit drugs.   Family History:  The  patient's family history includes Heart disease in her father and mother.  ROS:   Review of Systems  Constitutional: Positive for malaise/fatigue. Negative for fever, chills, weight loss and diaphoresis.  HENT: Negative for congestion.   Eyes: Negative for discharge and redness.  Respiratory: Positive for shortness of breath. Negative for cough, hemoptysis, sputum production and wheezing.   Cardiovascular: Positive for chest pain. Negative for palpitations, orthopnea, claudication, leg swelling and PND.  Gastrointestinal: Negative for heartburn, nausea, vomiting and abdominal pain.  Musculoskeletal: Negative for myalgias and falls.  Skin: Negative for rash.  Neurological: Positive for weakness. Negative for dizziness, tingling, tremors, sensory change, speech change, focal weakness and loss of consciousness.  Endo/Heme/Allergies: Does not bruise/bleed easily.  Psychiatric/Behavioral: Negative for substance abuse. The patient is not nervous/anxious.   All other systems reviewed and are negative.     PHYSICAL EXAM:  VS:  BP 120/78 mmHg  Pulse 79  Ht 5\' 3"  (1.6 m)  Wt 125 lb 12.8 oz (57.063 kg)  BMI  22.29 kg/m2 BMI: Body mass index is 22.29 kg/(m^2). Well nourished, well developed, in no acute distress HEENT: normocephalic, atraumatic Neck: no JVD, carotid bruits or masses Cardiac:  normal S1, S2; RRR; no murmurs, rubs, or gallops Lungs:  clear to auscultation bilaterally, no wheezing, rhonchi or rales Abd: soft, nontender, no hepatomegaly, + BS MS: no deformity or atrophy Ext: no edema Skin: warm and dry, no rash Neuro:  moves all extremities spontaneously, no focal abnormalities noted, follows commands Psych: euthymic mood, full affect   EKG:  Was ordered today. Shows NSR, 79 bpm, inferolateral st depression  Recent Labs: No results found for requested labs within last 365 days.  No results found for requested labs within last 365 days.   CrCl cannot be calculated (Patient  has no serum creatinine result on file.).   Wt Readings from Last 3 Encounters:  08/21/15 125 lb 12.8 oz (57.063 kg)  07/17/15 127 lb 8 oz (57.834 kg)  04/19/13 129 lb (58.514 kg)     Other studies reviewed: Additional studies/records reviewed today include: summarized above  ASSESSMENT AND PLAN:  1. Chest pain/nonobstructive CAD as above: She declines further ischemic evaluation at this time. Medically manage with titration of Imdur to 60 mg daily and adding Ranexa 500 mg bid. Nonspecific EKG changes were present on EKG from PCP on 07/15/15, though appear to be new when compared to EKG from 07/17/15. Continue aspirin 81 mg and Toprol-XL 25 mg daily. She is currently chest pain free.   2. Palpitations/PVCs: No recent episodes. Continue current medications.   3. HLD: Now on Crestor. Most recent LDL of 172.   4. HTN: Well controlled. Continue current medications.   Disposition: F/u with Dr. Fletcher Anon, MD in 1 month  Current medicines are reviewed at length with the patient today.  The patient did not have any concerns regarding medicines.  Melvern Banker PA-C 08/21/2015 12:33 PM     [address] [place], [place] [address] [phone]

## 2015-08-21 NOTE — Telephone Encounter (Signed)
Pt c/o of Chest Pain: STAT if CP now or developed within 24 hours  1. Are you having CP right now? Kind across her chest   2. Are you experiencing any other symptoms (ex. SOB, nausea, vomiting, sweating)? No not really just a bit of Nausea   3. How long have you been experiencing CP? Since last night around 5pm  4. Is your CP continuous or coming and going? Continuous across her chest    5. Have you taken Nitroglycerin? Took 1 this morning 4 am. May have helped a little bit but not much.  ? Thinks its not urgent enough to go ED but not sure. No arm pains

## 2015-08-21 NOTE — Telephone Encounter (Signed)
Patient states that she has been having chest pain that has been across her chest since 5 pm yesterday. She stated that she took nitro at 4 am and that it did not help that much. She also has complaints of nausea off and on but denies that she has any arm or jaw pain. She stated that she really wanted to be seen here in the office if possible instead of going to the emergency room. Appointment scheduled for 10 am with Christell Faith PA. Instructed her that if pain becomes worse she needs to go directly to the emergency room. She verbalized understanding of instructions and appointment time.

## 2015-08-21 NOTE — Patient Instructions (Addendum)
Medication Instructions:  Your physician has recommended you make the following change in your medication:  INCREASE Imdur 60mg  daily START taking ranexa 500mg  twice daily   Labwork: none  Testing/Procedures: none  Follow-Up: Your physician recommends that you schedule a follow-up appointment in: 3 months with Dr. Fletcher Anon.    Any Other Special Instructions Will Be Listed Below (If Applicable).     If you need a refill on your cardiac medications before your next appointment, please call your pharmacy.

## 2015-08-23 NOTE — Telephone Encounter (Signed)
Patient having complaints of chest pain but does not want to go to the emergency room, I was able to get her an appointment with Christell Faith PA and he has evaluated her.

## 2015-09-09 ENCOUNTER — Telehealth: Payer: Self-pay | Admitting: Cardiovascular Disease

## 2015-09-09 NOTE — Telephone Encounter (Signed)
Pt states she thinks she is having a side effect from Ranexa. States a couple of hours after she takes it, she feels really bad, and just wants to lay around, starts having some palpitations, and "lung pain". States she has stopped the medication 4 days ago, and since then she has felt better. Please call.

## 2015-09-09 NOTE — Telephone Encounter (Signed)
S/w pt who reports side effects after beginning Ranexa. She felt "bad, wanted to lay around, and felt like I was having palpitations". She stopped medication 4 days ago and has not had chest pain. At Feb 22 OV w/Ryan Dunn, Ranexa 500mg  BID added and imdur increased to 60mg  qd for chest pain. She reports some pain in her clavicle and ribs but feels that's related to arthritis. She would like to know if she can d/c Ranexa. Advised pt that if she is feeling better and asymptomic without Ranexa, MD may be agreeable w/discontinuing but will forward to Dr. Fletcher Anon to review and advise.

## 2015-09-10 NOTE — Telephone Encounter (Signed)
Ok to stop Ranexa

## 2015-09-10 NOTE — Telephone Encounter (Signed)
S/w pt spouse (on [place]) who states pt is not home at this time. Reviewed recommendations w/Tom who verbalized understanding and states he will tell her.

## 2015-10-01 ENCOUNTER — Other Ambulatory Visit: Payer: Self-pay | Admitting: Physician Assistant

## 2015-10-04 DIAGNOSIS — R109 Unspecified abdominal pain: Secondary | ICD-10-CM | POA: Diagnosis not present

## 2015-10-04 DIAGNOSIS — J301 Allergic rhinitis due to pollen: Secondary | ICD-10-CM | POA: Diagnosis not present

## 2015-10-04 DIAGNOSIS — R05 Cough: Secondary | ICD-10-CM | POA: Diagnosis not present

## 2015-10-04 DIAGNOSIS — K210 Gastro-esophageal reflux disease with esophagitis: Secondary | ICD-10-CM | POA: Diagnosis not present

## 2015-10-23 ENCOUNTER — Institutional Professional Consult (permissible substitution): Payer: Medicare Other | Admitting: Pulmonary Disease

## 2015-11-05 DIAGNOSIS — D649 Anemia, unspecified: Secondary | ICD-10-CM | POA: Diagnosis not present

## 2015-11-05 DIAGNOSIS — E7800 Pure hypercholesterolemia, unspecified: Secondary | ICD-10-CM | POA: Diagnosis not present

## 2015-11-05 DIAGNOSIS — I10 Essential (primary) hypertension: Secondary | ICD-10-CM | POA: Diagnosis not present

## 2015-11-07 DIAGNOSIS — I259 Chronic ischemic heart disease, unspecified: Secondary | ICD-10-CM | POA: Diagnosis not present

## 2015-11-07 DIAGNOSIS — I10 Essential (primary) hypertension: Secondary | ICD-10-CM | POA: Diagnosis not present

## 2015-11-07 DIAGNOSIS — N183 Chronic kidney disease, stage 3 (moderate): Secondary | ICD-10-CM | POA: Diagnosis not present

## 2015-11-07 DIAGNOSIS — R05 Cough: Secondary | ICD-10-CM | POA: Diagnosis not present

## 2015-11-12 DIAGNOSIS — L570 Actinic keratosis: Secondary | ICD-10-CM | POA: Diagnosis not present

## 2015-11-18 ENCOUNTER — Encounter: Payer: Self-pay | Admitting: Cardiovascular Disease

## 2015-11-18 ENCOUNTER — Ambulatory Visit (INDEPENDENT_AMBULATORY_CARE_PROVIDER_SITE_OTHER): Payer: Medicare Other | Admitting: Cardiovascular Disease

## 2015-11-18 VITALS — BP 140/70 | HR 65 | Ht 63.0 in | Wt 131.5 lb

## 2015-11-18 DIAGNOSIS — I25118 Atherosclerotic heart disease of native coronary artery with other forms of angina pectoris: Secondary | ICD-10-CM | POA: Diagnosis not present

## 2015-11-18 DIAGNOSIS — E785 Hyperlipidemia, unspecified: Secondary | ICD-10-CM | POA: Diagnosis not present

## 2015-11-18 DIAGNOSIS — I2510 Atherosclerotic heart disease of native coronary artery without angina pectoris: Secondary | ICD-10-CM

## 2015-11-18 NOTE — Patient Instructions (Signed)
Medication Instructions: Continue same medications.   Labwork: None.   Procedures/Testing: None.   Follow-Up: 6 months with Dr. Nicholson Starace.   Any Additional Special Instructions Will Be Listed Below (If Applicable).     If you need a refill on your cardiac medications before your next appointment, please call your pharmacy.   

## 2015-11-18 NOTE — Progress Notes (Signed)
Cardiology Office Note   Date:  11/18/2015   ID:  Heidi Wallace, DOB 12-13-1935, MRN [id number]  PCP:  Manon Hilding, MD  Cardiologist:   Kathlyn Sacramento, MD   Chief Complaint  Patient presents with  . other    3 month f/u c/o D/C ranexa due to chest discomfort and flu like symptoms unable to do any house work. Meds reveiwed verbally with pt.      History of Present Illness: Heidi Wallace is a 80 y.o. female who presents for A follow-up visit regarding nonobstructive coronary artery disease, PVCs and chest pain. She has known history of palpitations due to PVCs with occasional ventricular bigeminy, and mild to moderate nonobstructive coronary artery disease. Cardiac catheterization was performed in 2013 after an abnormal stress test and showed mild to moderate nonobstructive coronary artery disease with heavy calcifications. Ejection fraction was normal.  PVCs resolved with metoprolol.  She has known history of severe hyperlipidemia with intolerance to statins. She has been tolerating low-dose rosuvastatin with Zetia.  She was started early this year for atypical chest pain. She underwent a treadmill nuclear stress test which showed no evidence of ischemia with normal ejection fraction. Overall low risk study. She continued to have atypical chest pain and was started on Ranexa which actually made her feel worse with increased fatigue and chest pain at rest. She stopped the medication and currently feels better. She continues to deny exertional symptoms. She is overall feeling better than before.   Past Medical History  Diagnosis Date  . Chest pain   . GERD (gastroesophageal reflux disease)   . Hypertension   . Post concussion syndrome   . PVC's (premature ventricular contractions)     PVCs and ventricular bigeminy with abnormal nuclear test  . Hyperlipidemia   . Coronary artery disease     cardiac cath 08/2011: heavily calcified coronary arteries without obstructive  disease. 40% proximal LAD, 40% in OMs.     Past Surgical History  Procedure Laterality Date  . Gallbladder surgery  2003  . Tubal ligation  1974  . Breast cyst excision  benign right breast    1982  . Rotator cuff repair  right 1989  . Cardiac catheterization  07/2011    [facility]; [place]     Current Outpatient Prescriptions  Medication Sig Dispense Refill  . aspirin 81 MG tablet Take 81 mg by mouth daily.    . cetirizine (ZYRTEC) 10 MG tablet Take 10 mg by mouth daily.    Marland Kitchen ezetimibe (ZETIA) 10 MG tablet Take 10 mg by mouth daily.    . fluticasone (FLONASE) 50 MCG/ACT nasal spray Place into both nostrils daily.    Marland Kitchen gabapentin (NEURONTIN) 300 MG capsule Take 300 mg by mouth daily. At bedtime    . isosorbide mononitrate (IMDUR) 60 MG 24 hr tablet TAKE 1 TABLET(60 MG) BY MOUTH DAILY 90 tablet 2  . metoprolol succinate (TOPROL-XL) 50 MG 24 hr tablet Take 25 mg by mouth daily. Take with or immediately following a meal.    . olmesartan-hydrochlorothiazide (BENICAR HCT) 40-12.5 MG per tablet Take 0.5 tablets by mouth daily.     . ranitidine (ZANTAC) 150 MG tablet Take 150 mg by mouth 2 (two) times daily.    . rosuvastatin (CRESTOR) 5 MG tablet Take 1 tablet (5 mg total) by mouth daily. 30 tablet 5  . travoprost, benzalkonium, (TRAVATAN Z) 0.004 % ophthalmic solution Place 1 drop into both eyes at bedtime.      Marland Kitchen  Wheat Dextrin (BENEFIBER DRINK MIX PO) Take 1 Package by mouth daily as needed.      No current facility-administered medications for this visit.    Allergies:   Atorvastatin; Ceclor; Cephalosporins; Clindamycin/lincomycin; Keflex; Levaquin; Morphine; and Penicillins    Social History:  The patient  reports that she quit smoking about 43 years ago. Her smoking use included Cigarettes. She has a 16 pack-year smoking history. She has never used smokeless tobacco. She reports that she does not drink alcohol or use illicit drugs.   Family History:  The patient's family history includes  Heart disease in her father and mother.    ROS:  Please see the history of present illness.   Otherwise, review of systems are positive for none.   All other systems are reviewed and negative.    PHYSICAL EXAM: VS:  BP 140/70 mmHg  Pulse 65  Ht 5\' 3"  (1.6 m)  Wt 131 lb 8 oz (59.648 kg)  BMI 23.30 kg/m2 , BMI Body mass index is 23.3 kg/(m^2). GEN: Well nourished, well developed, in no acute distress HEENT: normal Neck: no JVD, carotid bruits, or masses Cardiac: RRR; no murmurs, rubs, or gallops,no edema  Respiratory:  clear to auscultation bilaterally, normal work of breathing GI: soft, nontender, nondistended, + BS MS: no deformity or atrophy Skin: warm and dry, no rash Neuro:  Strength and sensation are intact Psych: euthymic mood, full affect   EKG:  EKG is ordered today. The ekg ordered today demonstrates normal sinus rhythm with low voltage. No significant ST or T wave changes.   Recent Labs: No results found for requested labs within last 365 days.    Lipid Panel No results found for: CHOL, TRIG, HDL, CHOLHDL, VLDL, LDLCALC, LDLDIRECT    Wt Readings from Last 3 Encounters:  11/18/15 131 lb 8 oz (59.648 kg)  08/21/15 125 lb 12.8 oz (57.063 kg)  07/17/15 127 lb 8 oz (57.834 kg)       ASSESSMENT AND PLAN:  1.  Coronary artery disease involving native coronary arteries with atypical chest pain: Her recent episodes of chest pain do not seem anginal in nature. Previous cardiac catheterization showed mild to moderate nonobstructive calcified coronary artery disease. Nuclear stress test in January was normal. I recommend continuing medical therapy.  2. Hyperlipidemia: She is tolerating small dose rosuvastatin with Zetia. Lipid profile showed significant improvement. LDL improved from 172-107. Continue same medications.  3. Symptomatic PVCs: Currently well controlled with metoprolol.  4. Essential hypertension: Blood pressure is reasonably controlled on current  medications.   Disposition:   FU with me in 6 months  Signed,  Kathlyn Sacramento, MD  11/18/2015 11:30 AM    [place]

## 2015-12-11 DIAGNOSIS — M722 Plantar fascial fibromatosis: Secondary | ICD-10-CM | POA: Diagnosis not present

## 2015-12-11 DIAGNOSIS — R05 Cough: Secondary | ICD-10-CM | POA: Diagnosis not present

## 2015-12-11 DIAGNOSIS — M542 Cervicalgia: Secondary | ICD-10-CM | POA: Diagnosis not present

## 2015-12-13 DIAGNOSIS — R918 Other nonspecific abnormal finding of lung field: Secondary | ICD-10-CM | POA: Diagnosis not present

## 2015-12-13 DIAGNOSIS — R911 Solitary pulmonary nodule: Secondary | ICD-10-CM | POA: Diagnosis not present

## 2015-12-17 DIAGNOSIS — R110 Nausea: Secondary | ICD-10-CM | POA: Diagnosis not present

## 2015-12-17 DIAGNOSIS — R1013 Epigastric pain: Secondary | ICD-10-CM | POA: Diagnosis not present

## 2015-12-18 DIAGNOSIS — I2510 Atherosclerotic heart disease of native coronary artery without angina pectoris: Secondary | ICD-10-CM | POA: Diagnosis not present

## 2015-12-18 DIAGNOSIS — Z9109 Other allergy status, other than to drugs and biological substances: Secondary | ICD-10-CM | POA: Diagnosis not present

## 2015-12-18 DIAGNOSIS — R112 Nausea with vomiting, unspecified: Secondary | ICD-10-CM | POA: Diagnosis not present

## 2015-12-18 DIAGNOSIS — Z880 Allergy status to penicillin: Secondary | ICD-10-CM | POA: Diagnosis not present

## 2015-12-18 DIAGNOSIS — K838 Other specified diseases of biliary tract: Secondary | ICD-10-CM | POA: Diagnosis not present

## 2015-12-18 DIAGNOSIS — K210 Gastro-esophageal reflux disease with esophagitis: Secondary | ICD-10-CM | POA: Diagnosis not present

## 2015-12-18 DIAGNOSIS — F419 Anxiety disorder, unspecified: Secondary | ICD-10-CM | POA: Diagnosis not present

## 2015-12-18 DIAGNOSIS — Z79899 Other long term (current) drug therapy: Secondary | ICD-10-CM | POA: Diagnosis not present

## 2015-12-18 DIAGNOSIS — I10 Essential (primary) hypertension: Secondary | ICD-10-CM | POA: Diagnosis not present

## 2015-12-18 DIAGNOSIS — K2900 Acute gastritis without bleeding: Secondary | ICD-10-CM | POA: Diagnosis not present

## 2015-12-18 DIAGNOSIS — K529 Noninfective gastroenteritis and colitis, unspecified: Secondary | ICD-10-CM | POA: Diagnosis not present

## 2015-12-18 DIAGNOSIS — K219 Gastro-esophageal reflux disease without esophagitis: Secondary | ICD-10-CM | POA: Diagnosis not present

## 2015-12-18 DIAGNOSIS — Z883 Allergy status to other anti-infective agents status: Secondary | ICD-10-CM | POA: Diagnosis not present

## 2015-12-18 DIAGNOSIS — Z7982 Long term (current) use of aspirin: Secondary | ICD-10-CM | POA: Diagnosis not present

## 2015-12-18 DIAGNOSIS — Z881 Allergy status to other antibiotic agents status: Secondary | ICD-10-CM | POA: Diagnosis not present

## 2015-12-18 DIAGNOSIS — R072 Precordial pain: Secondary | ICD-10-CM | POA: Diagnosis not present

## 2015-12-18 DIAGNOSIS — Z885 Allergy status to narcotic agent status: Secondary | ICD-10-CM | POA: Diagnosis not present

## 2015-12-18 DIAGNOSIS — E860 Dehydration: Secondary | ICD-10-CM | POA: Diagnosis not present

## 2015-12-19 DIAGNOSIS — K219 Gastro-esophageal reflux disease without esophagitis: Secondary | ICD-10-CM | POA: Diagnosis not present

## 2015-12-19 DIAGNOSIS — R112 Nausea with vomiting, unspecified: Secondary | ICD-10-CM | POA: Diagnosis not present

## 2015-12-19 DIAGNOSIS — I10 Essential (primary) hypertension: Secondary | ICD-10-CM | POA: Diagnosis not present

## 2015-12-19 DIAGNOSIS — K209 Esophagitis, unspecified: Secondary | ICD-10-CM | POA: Diagnosis not present

## 2015-12-20 DIAGNOSIS — R112 Nausea with vomiting, unspecified: Secondary | ICD-10-CM | POA: Diagnosis not present

## 2015-12-20 DIAGNOSIS — K209 Esophagitis, unspecified: Secondary | ICD-10-CM | POA: Diagnosis not present

## 2016-01-01 ENCOUNTER — Other Ambulatory Visit: Payer: Self-pay | Admitting: Cardiovascular Disease

## 2016-01-03 ENCOUNTER — Telehealth: Payer: Self-pay | Admitting: Cardiovascular Disease

## 2016-01-03 NOTE — Telephone Encounter (Signed)
Pt has been sick for a while for she stopped taking her nextum for her PCP told her to stop it But ended up in hospital for 3 days.  Has seen her PCP this past Monday. But is worried she may be missing something.  They did an ekg and it came back normal But once patient came out of ED she had some really bad swelling in her legs and ankle Would like to be seen just to rule out any heart issues. Pt is coming 01/27/16 with her spouse Please advise.

## 2016-01-03 NOTE — Telephone Encounter (Signed)
S/w pt who reports admission to [facility] end of June x 3 days for severe vomiting. Was given IVF and clear liquid diet. Prior to admission, her Nexium was d/c'd by PCP. Nexium was resumed during admission. GI consult - did not think she needed EGD. Reports EKG was normal as she was being "checked out for a heart attack" She experienced lower extremity swelling for approximately 10 days but states this has resolved and was most likely r/t IVF. Still has some SOB and her throat is scratchy. She is still weak as she did not have food for 5 days and feels she has not "bounced back" like she should have.  Pt would like to see Dr. Fletcher Anon same day as her husband in July as she would feel more comfortable seeing him. Advised pt to continue to monitor s/s and be seen emergently if SOB worsens. She has appt July 31@ 2:45pm.  Pt verbalized understanding and is agreeable w/plan. She had no further questions.

## 2016-01-19 DIAGNOSIS — R05 Cough: Secondary | ICD-10-CM | POA: Diagnosis not present

## 2016-01-19 DIAGNOSIS — R5383 Other fatigue: Secondary | ICD-10-CM | POA: Diagnosis not present

## 2016-01-19 DIAGNOSIS — R1013 Epigastric pain: Secondary | ICD-10-CM | POA: Diagnosis not present

## 2016-01-19 DIAGNOSIS — I10 Essential (primary) hypertension: Secondary | ICD-10-CM | POA: Diagnosis not present

## 2016-01-19 DIAGNOSIS — K210 Gastro-esophageal reflux disease with esophagitis: Secondary | ICD-10-CM | POA: Diagnosis not present

## 2016-01-27 ENCOUNTER — Ambulatory Visit: Payer: Medicare Other | Admitting: Cardiovascular Disease

## 2016-01-28 DIAGNOSIS — J029 Acute pharyngitis, unspecified: Secondary | ICD-10-CM | POA: Diagnosis not present

## 2016-01-28 DIAGNOSIS — Z6823 Body mass index (BMI) 23.0-23.9, adult: Secondary | ICD-10-CM | POA: Diagnosis not present

## 2016-02-26 ENCOUNTER — Other Ambulatory Visit: Payer: Self-pay

## 2016-03-04 DIAGNOSIS — I739 Peripheral vascular disease, unspecified: Secondary | ICD-10-CM | POA: Diagnosis not present

## 2016-03-05 DIAGNOSIS — I2510 Atherosclerotic heart disease of native coronary artery without angina pectoris: Secondary | ICD-10-CM | POA: Diagnosis not present

## 2016-03-05 DIAGNOSIS — R079 Chest pain, unspecified: Secondary | ICD-10-CM | POA: Diagnosis not present

## 2016-03-05 DIAGNOSIS — I10 Essential (primary) hypertension: Secondary | ICD-10-CM | POA: Diagnosis not present

## 2016-03-05 DIAGNOSIS — Z79899 Other long term (current) drug therapy: Secondary | ICD-10-CM | POA: Diagnosis not present

## 2016-03-05 DIAGNOSIS — Z9109 Other allergy status, other than to drugs and biological substances: Secondary | ICD-10-CM | POA: Diagnosis not present

## 2016-03-05 DIAGNOSIS — R110 Nausea: Secondary | ICD-10-CM | POA: Diagnosis not present

## 2016-03-05 DIAGNOSIS — Z880 Allergy status to penicillin: Secondary | ICD-10-CM | POA: Diagnosis not present

## 2016-03-05 DIAGNOSIS — R072 Precordial pain: Secondary | ICD-10-CM | POA: Diagnosis not present

## 2016-03-05 DIAGNOSIS — Z7982 Long term (current) use of aspirin: Secondary | ICD-10-CM | POA: Diagnosis not present

## 2016-03-05 DIAGNOSIS — K219 Gastro-esophageal reflux disease without esophagitis: Secondary | ICD-10-CM | POA: Diagnosis not present

## 2016-03-05 DIAGNOSIS — Z881 Allergy status to other antibiotic agents status: Secondary | ICD-10-CM | POA: Diagnosis not present

## 2016-03-05 DIAGNOSIS — Z885 Allergy status to narcotic agent status: Secondary | ICD-10-CM | POA: Diagnosis not present

## 2016-03-13 DIAGNOSIS — Z23 Encounter for immunization: Secondary | ICD-10-CM | POA: Diagnosis not present

## 2016-03-23 DIAGNOSIS — Z6823 Body mass index (BMI) 23.0-23.9, adult: Secondary | ICD-10-CM | POA: Diagnosis not present

## 2016-03-23 DIAGNOSIS — J0101 Acute recurrent maxillary sinusitis: Secondary | ICD-10-CM | POA: Diagnosis not present

## 2016-04-04 DIAGNOSIS — I201 Angina pectoris with documented spasm: Secondary | ICD-10-CM | POA: Diagnosis not present

## 2016-04-04 DIAGNOSIS — Z6822 Body mass index (BMI) 22.0-22.9, adult: Secondary | ICD-10-CM | POA: Diagnosis not present

## 2016-04-04 DIAGNOSIS — K210 Gastro-esophageal reflux disease with esophagitis: Secondary | ICD-10-CM | POA: Diagnosis not present

## 2016-04-30 DIAGNOSIS — Z13220 Encounter for screening for lipoid disorders: Secondary | ICD-10-CM | POA: Diagnosis not present

## 2016-04-30 DIAGNOSIS — E7800 Pure hypercholesterolemia, unspecified: Secondary | ICD-10-CM | POA: Diagnosis not present

## 2016-04-30 DIAGNOSIS — N183 Chronic kidney disease, stage 3 (moderate): Secondary | ICD-10-CM | POA: Diagnosis not present

## 2016-04-30 DIAGNOSIS — D519 Vitamin B12 deficiency anemia, unspecified: Secondary | ICD-10-CM | POA: Diagnosis not present

## 2016-04-30 DIAGNOSIS — K210 Gastro-esophageal reflux disease with esophagitis: Secondary | ICD-10-CM | POA: Diagnosis not present

## 2016-04-30 DIAGNOSIS — I10 Essential (primary) hypertension: Secondary | ICD-10-CM | POA: Diagnosis not present

## 2016-05-06 DIAGNOSIS — Z6822 Body mass index (BMI) 22.0-22.9, adult: Secondary | ICD-10-CM | POA: Diagnosis not present

## 2016-05-06 DIAGNOSIS — E7800 Pure hypercholesterolemia, unspecified: Secondary | ICD-10-CM | POA: Diagnosis not present

## 2016-05-06 DIAGNOSIS — N183 Chronic kidney disease, stage 3 (moderate): Secondary | ICD-10-CM | POA: Diagnosis not present

## 2016-05-06 DIAGNOSIS — I10 Essential (primary) hypertension: Secondary | ICD-10-CM | POA: Diagnosis not present

## 2016-05-06 DIAGNOSIS — I259 Chronic ischemic heart disease, unspecified: Secondary | ICD-10-CM | POA: Diagnosis not present

## 2016-05-06 DIAGNOSIS — R05 Cough: Secondary | ICD-10-CM | POA: Diagnosis not present

## 2016-05-06 DIAGNOSIS — Z1389 Encounter for screening for other disorder: Secondary | ICD-10-CM | POA: Diagnosis not present

## 2016-05-18 ENCOUNTER — Encounter: Payer: Self-pay | Admitting: Cardiovascular Disease

## 2016-05-18 ENCOUNTER — Ambulatory Visit (INDEPENDENT_AMBULATORY_CARE_PROVIDER_SITE_OTHER): Payer: Medicare Other | Admitting: Cardiovascular Disease

## 2016-05-18 VITALS — BP 160/74 | HR 63 | Ht 63.5 in | Wt 126.5 lb

## 2016-05-18 DIAGNOSIS — I209 Angina pectoris, unspecified: Secondary | ICD-10-CM

## 2016-05-18 DIAGNOSIS — E7800 Pure hypercholesterolemia, unspecified: Secondary | ICD-10-CM | POA: Diagnosis not present

## 2016-05-18 DIAGNOSIS — I25118 Atherosclerotic heart disease of native coronary artery with other forms of angina pectoris: Secondary | ICD-10-CM

## 2016-05-18 DIAGNOSIS — I10 Essential (primary) hypertension: Secondary | ICD-10-CM

## 2016-05-18 DIAGNOSIS — I2510 Atherosclerotic heart disease of native coronary artery without angina pectoris: Secondary | ICD-10-CM

## 2016-05-18 DIAGNOSIS — I493 Ventricular premature depolarization: Secondary | ICD-10-CM | POA: Diagnosis not present

## 2016-05-18 NOTE — Progress Notes (Signed)
Cardiology Office Note   Date:  05/18/2016   ID:  Heidi Wallace, DOB 1936/02/14, MRN [id number]  PCP:  Manon Hilding, MD  Cardiologist:   Kathlyn Sacramento, MD   Chief Complaint  Patient presents with  . other      6 month fu. Pt states she has been doing well other than 1 episode of chestpain in Sept. Reviewed meds with pt verbally.      History of Present Illness: Heidi Wallace is a 80 y.o. female who presents for A follow-up visit regarding nonobstructive coronary artery disease, PVCs and chest pain. She has known history of palpitations due to PVCs with occasional ventricular bigeminy, and mild to moderate nonobstructive coronary artery disease. Cardiac catheterization was performed in 2013 after an abnormal stress test and showed mild to moderate nonobstructive coronary artery disease with heavy calcifications. Ejection fraction was normal.  PVCs resolved with metoprolol.  She has known history of severe hyperlipidemia with intolerance to statins. She has been tolerating low-dose rosuvastatin with Zetia.  She has known history of intermittent atypical chest pain with most recent nuclear stress test in January 2017 which showed no evidence of ischemia with normal ejection fraction. Overall low risk study. She did not respond well to Ranexa. She went to [facility] emergency room in September for chest pain with negative troponin. No episodes since then. She is overall doing well right now.   Past Medical History:  Diagnosis Date  . Chest pain   . Coronary artery disease    cardiac cath 08/2011: heavily calcified coronary arteries without obstructive disease. 40% proximal LAD, 40% in OMs.   . GERD (gastroesophageal reflux disease)   . Hyperlipidemia   . Hypertension   . Post concussion syndrome   . PVC's (premature ventricular contractions)    PVCs and ventricular bigeminy with abnormal nuclear test    Past Surgical History:  Procedure Laterality Date  . BREAST CYST  EXCISION  benign right breast   1982  . CARDIAC CATHETERIZATION  07/2011   [facility]; [facility]  . GALLBLADDER SURGERY  2003  . ROTATOR CUFF REPAIR  right 1989  . TUBAL LIGATION  1974     Current Outpatient Prescriptions  Medication Sig Dispense Refill  . aspirin 81 MG tablet Take 81 mg by mouth daily.    . cetirizine (ZYRTEC) 10 MG tablet Take 10 mg by mouth daily.    Marland Kitchen ezetimibe (ZETIA) 10 MG tablet Take 10 mg by mouth daily.    . fluticasone (FLONASE) 50 MCG/ACT nasal spray Place into both nostrils daily.    Marland Kitchen gabapentin (NEURONTIN) 300 MG capsule Take 300 mg by mouth daily. At bedtime    . isosorbide mononitrate (IMDUR) 60 MG 24 hr tablet TAKE 1 TABLET(60 MG) BY MOUTH DAILY 90 tablet 2  . metoprolol succinate (TOPROL-XL) 50 MG 24 hr tablet Take 25 mg by mouth daily. Take with or immediately following a meal.    . olmesartan-hydrochlorothiazide (BENICAR HCT) 40-12.5 MG per tablet Take 0.5 tablets by mouth daily.     . ranitidine (ZANTAC) 150 MG tablet Take 150 mg by mouth 2 (two) times daily.    . rosuvastatin (CRESTOR) 5 MG tablet TAKE 1 TABLET BY MOUTH EVERY DAY 30 tablet 6  . travoprost, benzalkonium, (TRAVATAN Z) 0.004 % ophthalmic solution Place 1 drop into both eyes at bedtime.      . Wheat Dextrin (BENEFIBER DRINK MIX PO) Take 1 Package by mouth daily as needed.  No current facility-administered medications for this visit.     Allergies:   Atorvastatin; Ceclor [cefaclor]; Cephalosporins; Clindamycin/lincomycin; Keflex [cephalexin]; Levaquin [levofloxacin in d5w]; Morphine; and Penicillins    Social History:  The patient  reports that she quit smoking about 43 years ago. Her smoking use included Cigarettes. She has a 16.00 pack-year smoking history. She has never used smokeless tobacco. She reports that she does not drink alcohol or use drugs.   Family History:  The patient's family history includes Heart disease in her father and mother.    ROS:  Please see the history of  present illness.   Otherwise, review of systems are positive for none.   All other systems are reviewed and negative.    PHYSICAL EXAM: VS:  BP (!) 160/74 (BP Location: Left Arm, Patient Position: Sitting, Cuff Size: Normal)   Pulse 63   Ht 5' 3.5" (1.613 m)   Wt 126 lb 8 oz (57.4 kg)   BMI 22.06 kg/m  , BMI Body mass index is 22.06 kg/m. GEN: Well nourished, well developed, in no acute distress HEENT: normal Neck: no JVD, carotid bruits, or masses Cardiac: RRR; no murmurs, rubs, or gallops,no edema  Respiratory:  clear to auscultation bilaterally, normal work of breathing GI: soft, nontender, nondistended, + BS MS: no deformity or atrophy Skin: warm and dry, no rash Neuro:  Strength and sensation are intact Psych: euthymic mood, full affect   EKG:  EKG is ordered today. The ekg ordered today demonstrates normal sinus rhythm with low voltage. No significant ST or T wave changes.   Recent Labs: No results found for requested labs within last 8760 hours.    Lipid Panel No results found for: CHOL, TRIG, HDL, CHOLHDL, VLDL, LDLCALC, LDLDIRECT    Wt Readings from Last 3 Encounters:  05/18/16 126 lb 8 oz (57.4 kg)  11/18/15 131 lb 8 oz (59.6 kg)  08/21/15 125 lb 12.8 oz (57.1 kg)       ASSESSMENT AND PLAN:  1.  Coronary artery disease involving native coronary arteries with Intermittent atypical chest pain:  Normal stress test early this year. No episodes since September. Continue medical therapy. She can use sublingual nitroglycerin as needed.  2. Hyperlipidemia: She is tolerating small dose rosuvastatin with Zetia.  I reviewed her most recent lipid profile which showed a total cholesterol of 213 with an LDL of 110. However, her HDL was significantly high at 92. Continue same treatment for now.  3. Symptomatic PVCs: Currently well controlled with metoprolol.  4. Essential hypertension: Blood pressure is elevated today but she reports optimal readings at home. I made no  changes.   Disposition:   FU with me in 6 months  Signed,  Kathlyn Sacramento, MD  05/18/2016 1:55 PM    [place]

## 2016-05-18 NOTE — Patient Instructions (Signed)
Medication Instructions: Continue same medications.   Labwork: None.   Procedures/Testing: None.   Follow-Up: 6 months with Dr. Marchella Hibbard.   Any Additional Special Instructions Will Be Listed Below (If Applicable).     If you need a refill on your cardiac medications before your next appointment, please call your pharmacy.   

## 2016-06-02 DIAGNOSIS — Z85828 Personal history of other malignant neoplasm of skin: Secondary | ICD-10-CM | POA: Diagnosis not present

## 2016-06-02 DIAGNOSIS — L570 Actinic keratosis: Secondary | ICD-10-CM | POA: Diagnosis not present

## 2016-06-25 ENCOUNTER — Other Ambulatory Visit: Payer: Self-pay | Admitting: Physician Assistant

## 2016-07-08 DIAGNOSIS — H401131 Primary open-angle glaucoma, bilateral, mild stage: Secondary | ICD-10-CM | POA: Diagnosis not present

## 2016-07-22 DIAGNOSIS — I739 Peripheral vascular disease, unspecified: Secondary | ICD-10-CM | POA: Diagnosis not present

## 2016-09-24 ENCOUNTER — Other Ambulatory Visit: Payer: Self-pay | Admitting: Cardiovascular Disease

## 2016-10-06 DIAGNOSIS — I739 Peripheral vascular disease, unspecified: Secondary | ICD-10-CM | POA: Diagnosis not present

## 2016-10-21 DIAGNOSIS — R0781 Pleurodynia: Secondary | ICD-10-CM | POA: Diagnosis not present

## 2016-10-23 ENCOUNTER — Other Ambulatory Visit: Payer: Self-pay | Admitting: Cardiovascular Disease

## 2016-10-23 MED ORDER — ROSUVASTATIN CALCIUM 5 MG PO TABS: 5.0000 mg | ORAL_TABLET | Freq: Every day | ORAL | 0 refills | Status: DC

## 2016-11-02 DIAGNOSIS — R5383 Other fatigue: Secondary | ICD-10-CM | POA: Diagnosis not present

## 2016-11-02 DIAGNOSIS — I259 Chronic ischemic heart disease, unspecified: Secondary | ICD-10-CM | POA: Diagnosis not present

## 2016-11-02 DIAGNOSIS — I10 Essential (primary) hypertension: Secondary | ICD-10-CM | POA: Diagnosis not present

## 2016-11-02 DIAGNOSIS — N183 Chronic kidney disease, stage 3 (moderate): Secondary | ICD-10-CM | POA: Diagnosis not present

## 2016-11-02 DIAGNOSIS — K210 Gastro-esophageal reflux disease with esophagitis: Secondary | ICD-10-CM | POA: Diagnosis not present

## 2016-11-02 DIAGNOSIS — E7800 Pure hypercholesterolemia, unspecified: Secondary | ICD-10-CM | POA: Diagnosis not present

## 2016-11-05 DIAGNOSIS — I10 Essential (primary) hypertension: Secondary | ICD-10-CM | POA: Diagnosis not present

## 2016-11-05 DIAGNOSIS — N183 Chronic kidney disease, stage 3 (moderate): Secondary | ICD-10-CM | POA: Diagnosis not present

## 2016-11-05 DIAGNOSIS — I259 Chronic ischemic heart disease, unspecified: Secondary | ICD-10-CM | POA: Diagnosis not present

## 2016-11-05 DIAGNOSIS — R05 Cough: Secondary | ICD-10-CM | POA: Diagnosis not present

## 2016-11-05 DIAGNOSIS — E7800 Pure hypercholesterolemia, unspecified: Secondary | ICD-10-CM | POA: Diagnosis not present

## 2016-11-05 DIAGNOSIS — Z6822 Body mass index (BMI) 22.0-22.9, adult: Secondary | ICD-10-CM | POA: Diagnosis not present

## 2016-11-16 DIAGNOSIS — H04123 Dry eye syndrome of bilateral lacrimal glands: Secondary | ICD-10-CM | POA: Diagnosis not present

## 2016-11-25 ENCOUNTER — Ambulatory Visit (INDEPENDENT_AMBULATORY_CARE_PROVIDER_SITE_OTHER): Payer: Medicare Other | Admitting: Cardiovascular Disease

## 2016-11-25 ENCOUNTER — Encounter: Payer: Self-pay | Admitting: Cardiovascular Disease

## 2016-11-25 VITALS — BP 126/76 | HR 61 | Ht 63.0 in | Wt 127.0 lb

## 2016-11-25 DIAGNOSIS — I209 Angina pectoris, unspecified: Secondary | ICD-10-CM

## 2016-11-25 DIAGNOSIS — I493 Ventricular premature depolarization: Secondary | ICD-10-CM

## 2016-11-25 DIAGNOSIS — R002 Palpitations: Secondary | ICD-10-CM

## 2016-11-25 DIAGNOSIS — E7800 Pure hypercholesterolemia, unspecified: Secondary | ICD-10-CM

## 2016-11-25 DIAGNOSIS — I25118 Atherosclerotic heart disease of native coronary artery with other forms of angina pectoris: Secondary | ICD-10-CM | POA: Diagnosis not present

## 2016-11-25 DIAGNOSIS — I10 Essential (primary) hypertension: Secondary | ICD-10-CM

## 2016-11-25 NOTE — Patient Instructions (Signed)

## 2016-11-25 NOTE — Progress Notes (Signed)
SUBJECTIVE: The patient is a 81 year old woman who I'm meeting for the first time. She previously saw Dr. Fletcher Anon. She has a history of nonobstructive coronary artery disease, PVCs, and chest pain. She has a known history of palpitations due to PVCs with occasional ventricular bigeminy and mild to moderate nonobstructive coronary artery disease. Cardiac catheterization was performed in 2013 after an abnormal stress test and showed mild to moderate nonobstructive CAD with heavy calcifications. Left ventricular systolic function was normal. PVCs were alleviated with metoprolol.  She has a history of severe hyperlipidemia with statin intolerance but has tolerated low-dose Crestor with Zetia.  She has a long history of intermittent atypical chest pain with most recent nuclear stress test in January 2017 showing no evidence of ischemia with normal ejection fraction, overall a low risk study.  She did not respond well to Ranexa.  The patient denies any symptoms of chest pain, palpitations, shortness of breath, lightheadedness, dizziness, leg swelling, orthopnea, PND, and syncope.  She avoids caffeine and drinks decaffeinated tea and coffee.  Her husband is also my patient.     Review of Systems: As per "subjective", otherwise negative.  Allergies  Allergen Reactions  . Atorvastatin     myalgia  . Ceclor [Cefaclor]   . Cephalosporins     REACTION: rash  . Clindamycin/Lincomycin     c-diff   . Keflex [Cephalexin]   . Levaquin [Levofloxacin In D5w]   . Morphine     REACTION: mother was,so pt ont take it  . Penicillins     REACTION: rash    Current Outpatient Prescriptions  Medication Sig Dispense Refill  . aspirin 81 MG tablet Take 81 mg by mouth daily.    Marland Kitchen esomeprazole (NEXIUM) 40 MG capsule Take by mouth daily.  3  . ezetimibe (ZETIA) 10 MG tablet Take 10 mg by mouth daily.    . fluticasone (FLONASE) 50 MCG/ACT nasal spray Place into both nostrils daily.    Marland Kitchen gabapentin  (NEURONTIN) 300 MG capsule Take 300 mg by mouth daily. At bedtime    . isosorbide mononitrate (IMDUR) 60 MG 24 hr tablet TAKE 1 TABLET(60 MG) BY MOUTH DAILY 90 tablet 3  . metoprolol succinate (TOPROL-XL) 50 MG 24 hr tablet Take 25 mg by mouth daily. Take with or immediately following a meal.    . olmesartan-hydrochlorothiazide (BENICAR HCT) 40-12.5 MG per tablet Take 0.5 tablets by mouth daily.     . ranitidine (ZANTAC) 150 MG tablet Take 150 mg by mouth as needed.     . rosuvastatin (CRESTOR) 5 MG tablet Take 1 tablet (5 mg total) by mouth daily. 30 tablet 0  . travoprost, benzalkonium, (TRAVATAN Z) 0.004 % ophthalmic solution Place 1 drop into both eyes at bedtime.      . Wheat Dextrin (BENEFIBER DRINK MIX PO) Take 1 Package by mouth daily as needed.      No current facility-administered medications for this visit.     Past Medical History:  Diagnosis Date  . Chest pain   . Coronary artery disease    cardiac cath 08/2011: heavily calcified coronary arteries without obstructive disease. 40% proximal LAD, 40% in OMs.   . GERD (gastroesophageal reflux disease)   . Hyperlipidemia   . Hypertension   . Post concussion syndrome   . PVC's (premature ventricular contractions)    PVCs and ventricular bigeminy with abnormal nuclear test    Past Surgical History:  Procedure Laterality Date  . BREAST CYST EXCISION  benign right breast   1982  . CARDIAC CATHETERIZATION  07/2011   [facility]; [facility]  . GALLBLADDER SURGERY  2003  . ROTATOR CUFF REPAIR  right 1989  . TUBAL LIGATION  1974    Social History   Social History  . Marital status: Married    Spouse name: N/A  . Number of children: N/A  . Years of education: N/A   Occupational History  . former Therapist, sports    Social History Main Topics  . Smoking status: Former Smoker    Packs/day: 1.00    Years: 16.00    Types: Cigarettes    Quit date: 06/29/1972  . Smokeless tobacco: Never Used  . Alcohol use No  . Drug use: No  . Sexual activity: Not  on file   Other Topics Concern  . Not on file   Social History Narrative  . No narrative on file     Vitals:   11/25/16 1058  BP: 126/76  Pulse: 61  SpO2: 98%  Weight: 127 lb (57.6 kg)  Height: 5\' 3"  (1.6 m)    Wt Readings from Last 3 Encounters:  11/25/16 127 lb (57.6 kg)  05/18/16 126 lb 8 oz (57.4 kg)  11/18/15 131 lb 8 oz (59.6 kg)     PHYSICAL EXAM General: NAD HEENT: Normal. Neck: No JVD, no thyromegaly. Lungs: Clear to auscultation bilaterally with normal respiratory effort. CV: Nondisplaced PMI.  Regular rate and rhythm, normal S1/S2, no S3/S4, no murmur. No pretibial or periankle edema.  No carotid bruit.   Abdomen: Soft, nontender, no distention.  Neurologic: Alert and oriented.  Psych: Normal affect. Skin: Normal. Musculoskeletal: No gross deformities.    ECG: Most recent ECG reviewed.   Labs: No results found for: K, BUN, CREATININE, ALT, TSH, HGB   Lipids: No results found for: LDLCALC, LDLDIRECT, CHOL, TRIG, HDL     ASSESSMENT AND PLAN: 1. Coronary artery disease, nonobstructive with chest pain: Symptomatically stable on aspirin, Imdur 60 mg, Toprol XL 25 mg, and Crestor 5 mg. No changes.  2. Hyperlipidemia: Continue Crestor 5 mg and Zetia.  3. Symptomatic PVCs: Symptomatically stable on Toprol-XL 25 mg daily. No changes.  4. Essential hypertension: Controlled on present therapy. No changes.    Disposition: Follow up 1 yr  Kate Sable, M.D., F.A.C.C.

## 2016-12-08 DIAGNOSIS — D485 Neoplasm of uncertain behavior of skin: Secondary | ICD-10-CM | POA: Diagnosis not present

## 2016-12-08 DIAGNOSIS — L570 Actinic keratosis: Secondary | ICD-10-CM | POA: Diagnosis not present

## 2016-12-08 DIAGNOSIS — Z85828 Personal history of other malignant neoplasm of skin: Secondary | ICD-10-CM | POA: Diagnosis not present

## 2016-12-08 DIAGNOSIS — L821 Other seborrheic keratosis: Secondary | ICD-10-CM | POA: Diagnosis not present

## 2017-01-18 DIAGNOSIS — N183 Chronic kidney disease, stage 3 (moderate): Secondary | ICD-10-CM | POA: Diagnosis not present

## 2017-01-18 DIAGNOSIS — R911 Solitary pulmonary nodule: Secondary | ICD-10-CM | POA: Diagnosis not present

## 2017-01-18 DIAGNOSIS — H401131 Primary open-angle glaucoma, bilateral, mild stage: Secondary | ICD-10-CM | POA: Diagnosis not present

## 2017-01-18 DIAGNOSIS — D649 Anemia, unspecified: Secondary | ICD-10-CM | POA: Diagnosis not present

## 2017-01-18 DIAGNOSIS — Z6823 Body mass index (BMI) 23.0-23.9, adult: Secondary | ICD-10-CM | POA: Diagnosis not present

## 2017-01-18 DIAGNOSIS — R1033 Periumbilical pain: Secondary | ICD-10-CM | POA: Diagnosis not present

## 2017-01-18 DIAGNOSIS — I739 Peripheral vascular disease, unspecified: Secondary | ICD-10-CM | POA: Diagnosis not present

## 2017-01-18 DIAGNOSIS — I259 Chronic ischemic heart disease, unspecified: Secondary | ICD-10-CM | POA: Diagnosis not present

## 2017-01-18 DIAGNOSIS — I10 Essential (primary) hypertension: Secondary | ICD-10-CM | POA: Diagnosis not present

## 2017-01-28 DIAGNOSIS — R911 Solitary pulmonary nodule: Secondary | ICD-10-CM | POA: Diagnosis not present

## 2017-01-28 DIAGNOSIS — I700 Atherosclerosis of aorta: Secondary | ICD-10-CM | POA: Diagnosis not present

## 2017-01-28 DIAGNOSIS — I712 Thoracic aortic aneurysm, without rupture: Secondary | ICD-10-CM | POA: Diagnosis not present

## 2017-01-28 DIAGNOSIS — R291 Meningismus: Secondary | ICD-10-CM | POA: Diagnosis not present

## 2017-02-11 ENCOUNTER — Telehealth: Payer: Self-pay | Admitting: Cardiovascular Disease

## 2017-02-11 NOTE — Telephone Encounter (Signed)
Patient is concerned about CAT scan of her lungs that showed possible aneurysm.  She is concerned that her medication has cause this recent discovery.   [phone] leave message   She will be going on trip today please try to call later afternoon

## 2017-02-11 NOTE — Telephone Encounter (Signed)
Where is the CT scan she is referring to? I don't find one.

## 2017-02-11 NOTE — Telephone Encounter (Signed)
Requested most recent CT from [facility].

## 2017-02-12 NOTE — Telephone Encounter (Signed)
Patient notified

## 2017-02-12 NOTE — Telephone Encounter (Signed)
Faxed to [place] office. Please advise.

## 2017-02-12 NOTE — Telephone Encounter (Signed)
Addressed in [place] office today with Alma Friendly.

## 2017-02-20 ENCOUNTER — Encounter (HOSPITAL_COMMUNITY): Payer: Self-pay | Admitting: Adult Health

## 2017-02-20 ENCOUNTER — Emergency Department (HOSPITAL_COMMUNITY): Payer: Medicare Other

## 2017-02-20 ENCOUNTER — Emergency Department (HOSPITAL_COMMUNITY)
Admission: EM | Admit: 2017-02-20 | Discharge: 2017-02-20 | Disposition: A | Discharge status: Home / Self Care | Payer: Medicare Other | Attending: Emergency Medicine | Admitting: Emergency Medicine

## 2017-02-20 DIAGNOSIS — I10 Essential (primary) hypertension: Secondary | ICD-10-CM | POA: Insufficient documentation

## 2017-02-20 DIAGNOSIS — Z7982 Long term (current) use of aspirin: Secondary | ICD-10-CM | POA: Diagnosis not present

## 2017-02-20 DIAGNOSIS — Z87891 Personal history of nicotine dependence: Secondary | ICD-10-CM | POA: Diagnosis not present

## 2017-02-20 DIAGNOSIS — Z79899 Other long term (current) drug therapy: Secondary | ICD-10-CM | POA: Diagnosis not present

## 2017-02-20 DIAGNOSIS — R079 Chest pain, unspecified: Secondary | ICD-10-CM | POA: Insufficient documentation

## 2017-02-20 DIAGNOSIS — I2510 Atherosclerotic heart disease of native coronary artery without angina pectoris: Secondary | ICD-10-CM | POA: Insufficient documentation

## 2017-02-20 DIAGNOSIS — R197 Diarrhea, unspecified: Secondary | ICD-10-CM | POA: Diagnosis not present

## 2017-02-20 DIAGNOSIS — R0789 Other chest pain: Secondary | ICD-10-CM | POA: Diagnosis not present

## 2017-02-20 HISTORY — DX: Personal history of other medical treatment: Z92.89

## 2017-02-20 LAB — URINALYSIS, ROUTINE W REFLEX MICROSCOPIC
BACTERIA UA: NONE SEEN
Bilirubin Urine: NEGATIVE
Glucose, UA: NEGATIVE mg/dL
HGB URINE DIPSTICK: NEGATIVE
Ketones, ur: 5 mg/dL — AB
NITRITE: NEGATIVE
PROTEIN: NEGATIVE mg/dL
Specific Gravity, Urine: 1.016 (ref 1.005–1.030)
pH: 8 (ref 5.0–8.0)

## 2017-02-20 LAB — BASIC METABOLIC PANEL
Anion gap: 10 (ref 5–15)
BUN: 13 mg/dL (ref 6–20)
CHLORIDE: 100 mmol/L — AB (ref 101–111)
CO2: 24 mmol/L (ref 22–32)
Calcium: 9.6 mg/dL (ref 8.9–10.3)
Creatinine, Ser: 0.89 mg/dL (ref 0.44–1.00)
GFR calc non Af Amer: >60 mL/min (ref >60)
Glucose, Bld: 130 mg/dL — ABNORMAL HIGH (ref 65–99)
POTASSIUM: 3.6 mmol/L (ref 3.5–5.1)
SODIUM: 134 mmol/L — AB (ref 135–145)

## 2017-02-20 LAB — HEPATIC FUNCTION PANEL
ALK PHOS: 101 U/L (ref 38–126)
ALT: 16 U/L (ref 14–54)
AST: 21 U/L (ref 15–41)
Albumin: 4.2 g/dL (ref 3.5–5.0)
BILIRUBIN DIRECT: 0.1 mg/dL (ref 0.1–0.5)
Indirect Bilirubin: 0.3 mg/dL (ref 0.3–0.9)
Total Bilirubin: 0.4 mg/dL (ref 0.3–1.2)
Total Protein: 6.9 g/dL (ref 6.5–8.1)

## 2017-02-20 LAB — CBC
HEMATOCRIT: 38.6 % (ref 36.0–46.0)
HEMOGLOBIN: 13.1 g/dL (ref 12.0–15.0)
MCH: 32 pg (ref 26.0–34.0)
MCHC: 33.9 g/dL (ref 30.0–36.0)
MCV: 94.1 fL (ref 78.0–100.0)
Platelets: 273 10*3/uL (ref 150–400)
RBC: 4.1 MIL/uL (ref 3.87–5.11)
RDW: 12.8 % (ref 11.5–15.5)
WBC: 8.5 10*3/uL (ref 4.0–10.5)

## 2017-02-20 LAB — TROPONIN I

## 2017-02-20 LAB — I-STAT TROPONIN, ED: Troponin i, poc: 0.02 ng/mL (ref 0.00–0.08)

## 2017-02-20 LAB — LIPASE, BLOOD: Lipase: 33 U/L (ref 11–51)

## 2017-02-20 MED ORDER — NITROGLYCERIN 2 % TD OINT
0.5000 [in_us] | TOPICAL_OINTMENT | Freq: Once | TRANSDERMAL | Status: AC
  Administered 2017-02-20: 0.5 [in_us] via TOPICAL
  Filled 2017-02-20: qty 1

## 2017-02-20 MED ORDER — ASPIRIN 81 MG PO CHEW
324.0000 mg | CHEWABLE_TABLET | Freq: Once | ORAL | Status: AC
  Administered 2017-02-20: 324 mg via ORAL
  Filled 2017-02-20: qty 4

## 2017-02-20 NOTE — ED Provider Notes (Signed)
Pt received at sign out with 2nd troponin pending. 2nd troponin negative. EKG unchanged from previous 04/2016. Pt does not want to stay for admission. Strict return precautions given. Dx and testing d/w pt and family.  Questions answered.  Verb understanding, agreeable to d/c home with outpt f/u.   BP 115/66   Pulse 78   Temp (!) 97.5 F (36.4 C) (Oral)   Resp 17   SpO2 97%     Results for orders placed or performed during the hospital encounter of 16/10/96  Basic metabolic panel  Result Value Ref Range   Sodium 134 (L) 135 - 145 mmol/L   Potassium 3.6 3.5 - 5.1 mmol/L   Chloride 100 (L) 101 - 111 mmol/L   CO2 24 22 - 32 mmol/L   Glucose, Bld 130 (H) 65 - 99 mg/dL   BUN 13 6 - 20 mg/dL   Creatinine, Ser 0.89 0.44 - 1.00 mg/dL   Calcium 9.6 8.9 - 10.3 mg/dL   GFR calc non Af Amer >60 >60 mL/min   GFR calc Af Amer >60 >60 mL/min   Anion gap 10 5 - 15  CBC  Result Value Ref Range   WBC 8.5 4.0 - 10.5 K/uL   RBC 4.10 3.87 - 5.11 MIL/uL   Hemoglobin 13.1 12.0 - 15.0 g/dL   HCT 38.6 36.0 - 46.0 %   MCV 94.1 78.0 - 100.0 fL   MCH 32.0 26.0 - 34.0 pg   MCHC 33.9 30.0 - 36.0 g/dL   RDW 12.8 11.5 - 15.5 %   Platelets 273 150 - 400 K/uL  Hepatic function panel  Result Value Ref Range   Total Protein 6.9 6.5 - 8.1 g/dL   Albumin 4.2 3.5 - 5.0 g/dL   AST 21 15 - 41 U/L   ALT 16 14 - 54 U/L   Alkaline Phosphatase 101 38 - 126 U/L   Total Bilirubin 0.4 0.3 - 1.2 mg/dL   Bilirubin, Direct 0.1 0.1 - 0.5 mg/dL   Indirect Bilirubin 0.3 0.3 - 0.9 mg/dL  Lipase, blood  Result Value Ref Range   Lipase 33 11 - 51 U/L  Urinalysis, Routine w reflex microscopic  Result Value Ref Range   Color, Urine YELLOW YELLOW   APPearance CLEAR CLEAR   Specific Gravity, Urine 1.016 1.005 - 1.030   pH 8.0 5.0 - 8.0   Glucose, UA NEGATIVE NEGATIVE mg/dL   Hgb urine dipstick NEGATIVE NEGATIVE   Bilirubin Urine NEGATIVE NEGATIVE   Ketones, ur 5 (A) NEGATIVE mg/dL   Protein, ur NEGATIVE NEGATIVE  mg/dL   Nitrite NEGATIVE NEGATIVE   Leukocytes, UA TRACE (A) NEGATIVE   RBC / HPF 0-5 0 - 5 RBC/hpf   WBC, UA 6-30 0 - 5 WBC/hpf   Bacteria, UA NONE SEEN NONE SEEN   Squamous Epithelial / LPF 0-5 (A) NONE SEEN   Mucus PRESENT   Troponin I  Result Value Ref Range   Troponin I <0.03 <0.03 ng/mL  I-stat troponin, ED  Result Value Ref Range   Troponin i, poc 0.02 0.00 - 0.08 ng/mL   Comment 3           Dg Chest 2 View Result Date: 02/20/2017 CLINICAL DATA:  Chest pain, weakness EXAM: CHEST  2 VIEW COMPARISON:  10/21/2016 FINDINGS: Heart is borderline in size. Minimal left base atelectasis. Right lung is clear. No effusions. No acute bony abnormality. IMPRESSION: Left base atelectasis. Electronically Signed   By: Rolm Baptise M.D.  On: 02/20/2017 18:34      Francine Graven, DO 02/20/17 2226

## 2017-02-20 NOTE — ED Triage Notes (Addendum)
Presents with onset of not feeling well this morning, at 1 am deveolped chest pain, nausea and generalized weakness. She took 1 nitro with relief of chest pain but endorses still feeling weak and nauseated in waves. Pt is pale. Endorses one bout of diarrhea this afternoon.

## 2017-02-20 NOTE — ED Notes (Signed)
Pt transported and returned from xray

## 2017-02-20 NOTE — Discharge Instructions (Addendum)
Take your usual prescriptions as previously directed.  Call your regular medical doctor and your Cardiologist on Monday to schedule a follow up appointment within the next 3 days.  Return to the Emergency Department immediately sooner if worsening.

## 2017-02-20 NOTE — ED Provider Notes (Signed)
[facility] DEPT Provider Note   CSN: [id number] Arrival date & time: 02/20/17  1756     History   Chief Complaint Chief Complaint  Patient presents with  . Chest Pain    HPI Heidi Wallace is a 81 y.o. female.  HPI Patient presents with episodic chest pain since 1 PM this afternoon. She states the pain is mostly in the left side of her chest and radiates to her left arm. Occasionally is pressure-like sensation. She also has associated nausea. She's had 4 bouts of loose stool today. No blood in stool. No shortness of breath or cough. States she took one of her nitroglycerin at home with significant improvement of the pain. She's had recurrence of the pain while in the emergency department. She also states she's felt generally unwell all day. She endorses generalized weakness and fatigue. Denies any urinary symptoms. Past Medical History:  Diagnosis Date  . Chest pain   . Coronary artery disease    cardiac cath 08/2011: heavily calcified coronary arteries without obstructive disease. 40% proximal LAD, 40% in OMs.   . GERD (gastroesophageal reflux disease)   . History of nuclear stress test 2017   low risk study  . Hyperlipidemia   . Hypertension   . Post concussion syndrome   . PVC's (premature ventricular contractions)    PVCs and ventricular bigeminy with abnormal nuclear test    Patient Active Problem List   Diagnosis Date Noted  . Coronary artery disease   . PVC's (premature ventricular contractions)   . Lung nodule 10/30/2010  . GERD (gastroesophageal reflux disease)   . Hypertension   . Hyperlipidemia   . Post concussion syndrome   . GERD 10/30/2009  . PRECORDIAL PAIN 10/30/2009    Past Surgical History:  Procedure Laterality Date  . BREAST CYST EXCISION  benign right breast   1982  . CARDIAC CATHETERIZATION  07/2011   [facility]; [facility]  . GALLBLADDER SURGERY  2003  . ROTATOR CUFF REPAIR  right 1989  . TUBAL LIGATION  1974    OB History    No data  available       Home Medications    Prior to Admission medications   Medication Sig Start Date End Date Taking? Authorizing Provider  aspirin 81 MG tablet Take 81 mg by mouth daily. 08/21/11  Yes Wellington Hampshire, MD  esomeprazole (NEXIUM) 40 MG capsule Take 40 mg by mouth daily.  11/07/16  Yes [provider]  ezetimibe (ZETIA) 10 MG tablet Take 10 mg by mouth every morning.    Yes [provider]  gabapentin (NEURONTIN) 300 MG capsule Take 300 mg by mouth at bedtime.    Yes [provider]  isosorbide mononitrate (IMDUR) 60 MG 24 hr tablet TAKE 1 TABLET(60 MG) BY MOUTH DAILY 06/26/16  Yes Arida, Muhammad A, MD  LUMIGAN 0.01 % SOLN Apply 1 drop to eye at bedtime.  01/18/17  Yes [provider]  metoprolol succinate (TOPROL-XL) 50 MG 24 hr tablet Take 25 mg by mouth daily. Take with or immediately following a meal.   Yes [provider]  nitroGLYCERIN (NITROSTAT) 0.4 MG SL tablet Place 0.4 mg under the tongue every 5 (five) minutes as needed for chest pain.   Yes [provider]  olmesartan-hydrochlorothiazide (BENICAR HCT) 40-12.5 MG per tablet Take 0.5 tablets by mouth daily.    Yes [provider]  rosuvastatin (CRESTOR) 5 MG tablet Take 1 tablet (5 mg total) by mouth daily. 10/23/16  Yes Wellington Hampshire, MD  Wheat Dextrin (BENEFIBER DRINK MIX PO) Take 1 Package by mouth daily.    Yes [provider]    Family History Family History  Problem Relation Age of Onset  . Heart disease Father   . Heart disease Mother     Social History Social History  Substance Use Topics  . Smoking status: Former Smoker    Packs/day: 1.00    Years: 16.00    Types: Cigarettes    Quit date: 06/29/1972  . Smokeless tobacco: Never Used  . Alcohol use No     Allergies   Atorvastatin; Clindamycin/lincomycin; Levaquin [levofloxacin in d5w]; Morphine; Ceclor [cefaclor]; Cephalosporins; Keflex [cephalexin]; and  Penicillins   Review of Systems Review of Systems  Constitutional: Positive for diaphoresis and fatigue. Negative for chills and fever.  HENT: Negative for sinus pressure, sore throat and trouble swallowing.   Respiratory: Positive for chest tightness. Negative for cough, shortness of breath and wheezing.   Cardiovascular: Positive for chest pain. Negative for palpitations and leg swelling.  Gastrointestinal: Positive for diarrhea and nausea. Negative for abdominal pain, constipation and vomiting.  Genitourinary: Negative for dysuria, flank pain and frequency.  Musculoskeletal: Negative for back pain, myalgias, neck pain and neck stiffness.  Skin: Negative for rash and wound.  Neurological: Negative for dizziness, weakness, light-headedness, numbness and headaches.  Psychiatric/Behavioral: The patient is nervous/anxious (anxious appearing).      Physical Exam Updated Vital Signs BP 115/66   Pulse 78   Temp (!) 97.5 F (36.4 C) (Oral)   Resp 17   SpO2 97%   Physical Exam   ED Treatments / Results  Labs (all labs ordered are listed, but only abnormal results are displayed) Labs Reviewed  BASIC METABOLIC PANEL - Abnormal; Notable for the following:       Result Value   Sodium 134 (*)    Chloride 100 (*)    Glucose, Bld 130 (*)    All other components within normal limits  URINALYSIS, ROUTINE W REFLEX MICROSCOPIC - Abnormal; Notable for the following:    Ketones, ur 5 (*)    Leukocytes, UA TRACE (*)    Squamous Epithelial / LPF 0-5 (*)    All other components within normal limits  CBC  HEPATIC FUNCTION PANEL  LIPASE, BLOOD  TROPONIN I  I-STAT TROPONIN, ED    EKG  EKG Interpretation  Date/Time:  Saturday February 20 2017 19:06:35 EDT Ventricular Rate:  76 PR Interval:    QRS Duration: 100 QT Interval:  415 QTC Calculation: 467 R Axis:   -13 Text Interpretation:  Sinus rhythm RSR' in V1 or V2, right VCD or RVH Electrode noise No significant change since last  tracing EARLIER SAME DATE Confirmed by Rolland Porter [phone]) on 02/21/2017 5:31:47 PM       Radiology Dg Chest 2 View  Result Date: 02/20/2017 CLINICAL DATA:  Chest pain, weakness EXAM: CHEST  2 VIEW COMPARISON:  10/21/2016 FINDINGS: Heart is borderline in size. Minimal left base atelectasis. Right lung is clear. No effusions. No acute bony abnormality. IMPRESSION: Left base atelectasis. Electronically Signed   By: Rolm Baptise M.D.   On: 02/20/2017 18:34    Procedures Procedures (including critical care time)  Medications Ordered in ED Medications  aspirin chewable tablet 324 mg (324 mg Oral Given 02/20/17 1907)  nitroGLYCERIN (NITROGLYN) 2 % ointment 0.5 inch (0.5 inches Topical Given 02/20/17 1907)     Initial Impression / Assessment and Plan / ED Course  I have reviewed the triage vital signs and the nursing notes.  Pertinent labs & imaging results that were available during my care of the patient were reviewed by me and considered in my medical decision making (see chart for details).     Patient was not willing to be admitted. Discuss obtaining delta troponin. She is symptom-free. She agrees with plan. To oncoming emergency physician pending delta troponin. She understands need to follow-up with her cardiologist and return precautions given.  Final Clinical Impressions(s) / ED Diagnoses   Final diagnoses:  Nonspecific chest pain  Diarrhea, unspecified type    New Prescriptions Discharge Medication List as of 02/20/2017 10:12 PM       Julianne Rice, MD 02/21/17 2330

## 2017-03-30 DIAGNOSIS — I712 Thoracic aortic aneurysm, without rupture: Secondary | ICD-10-CM | POA: Diagnosis not present

## 2017-03-30 DIAGNOSIS — J029 Acute pharyngitis, unspecified: Secondary | ICD-10-CM | POA: Diagnosis not present

## 2017-03-30 DIAGNOSIS — J069 Acute upper respiratory infection, unspecified: Secondary | ICD-10-CM | POA: Diagnosis not present

## 2017-03-30 DIAGNOSIS — Z6823 Body mass index (BMI) 23.0-23.9, adult: Secondary | ICD-10-CM | POA: Diagnosis not present

## 2017-04-06 DIAGNOSIS — I739 Peripheral vascular disease, unspecified: Secondary | ICD-10-CM | POA: Diagnosis not present

## 2017-04-19 DIAGNOSIS — H1851 Endothelial corneal dystrophy: Secondary | ICD-10-CM | POA: Diagnosis not present

## 2017-04-28 DIAGNOSIS — J0100 Acute maxillary sinusitis, unspecified: Secondary | ICD-10-CM | POA: Diagnosis not present

## 2017-04-28 DIAGNOSIS — Z6823 Body mass index (BMI) 23.0-23.9, adult: Secondary | ICD-10-CM | POA: Diagnosis not present

## 2017-04-28 DIAGNOSIS — J029 Acute pharyngitis, unspecified: Secondary | ICD-10-CM | POA: Diagnosis not present

## 2017-04-28 DIAGNOSIS — I712 Thoracic aortic aneurysm, without rupture: Secondary | ICD-10-CM | POA: Diagnosis not present

## 2017-04-29 DIAGNOSIS — N183 Chronic kidney disease, stage 3 (moderate): Secondary | ICD-10-CM | POA: Diagnosis not present

## 2017-04-29 DIAGNOSIS — I10 Essential (primary) hypertension: Secondary | ICD-10-CM | POA: Diagnosis not present

## 2017-04-29 DIAGNOSIS — K210 Gastro-esophageal reflux disease with esophagitis: Secondary | ICD-10-CM | POA: Diagnosis not present

## 2017-04-29 DIAGNOSIS — E7800 Pure hypercholesterolemia, unspecified: Secondary | ICD-10-CM | POA: Diagnosis not present

## 2017-05-05 DIAGNOSIS — R7301 Impaired fasting glucose: Secondary | ICD-10-CM | POA: Diagnosis not present

## 2017-05-05 DIAGNOSIS — I10 Essential (primary) hypertension: Secondary | ICD-10-CM | POA: Diagnosis not present

## 2017-05-05 DIAGNOSIS — E7800 Pure hypercholesterolemia, unspecified: Secondary | ICD-10-CM | POA: Diagnosis not present

## 2017-05-05 DIAGNOSIS — R05 Cough: Secondary | ICD-10-CM | POA: Diagnosis not present

## 2017-05-05 DIAGNOSIS — N183 Chronic kidney disease, stage 3 (moderate): Secondary | ICD-10-CM | POA: Diagnosis not present

## 2017-05-05 DIAGNOSIS — I259 Chronic ischemic heart disease, unspecified: Secondary | ICD-10-CM | POA: Diagnosis not present

## 2017-06-02 DIAGNOSIS — Z23 Encounter for immunization: Secondary | ICD-10-CM | POA: Diagnosis not present

## 2017-06-15 DIAGNOSIS — L570 Actinic keratosis: Secondary | ICD-10-CM | POA: Diagnosis not present

## 2017-07-19 ENCOUNTER — Other Ambulatory Visit: Payer: Self-pay | Admitting: *Deleted

## 2017-07-19 MED ORDER — ASPIRIN 81 MG PO TABS: 81.0000 mg | ORAL_TABLET | Freq: Every day | ORAL | 3 refills | Status: DC

## 2017-07-22 ENCOUNTER — Other Ambulatory Visit: Payer: Self-pay | Admitting: Cardiovascular Disease

## 2017-07-22 NOTE — Telephone Encounter (Signed)
Refill Request.  

## 2017-11-02 ENCOUNTER — Telehealth: Payer: Self-pay | Admitting: Cardiovascular Disease

## 2017-11-02 NOTE — Telephone Encounter (Signed)
It would be fine to take calcium. This will help to prevent fractures.

## 2017-11-02 NOTE — Telephone Encounter (Signed)
Patient has been under the care of Elsie Saas A MD.for 2 fractures in her foot. Dr. Noemi Chapel wants her to start on the following : Calcium, Vitamin D 3 & Vitamin K2. Patient had heart cath several years ago. Was told not to take Calcium. She is concerned about taking Calcium.

## 2017-11-03 NOTE — Telephone Encounter (Signed)
Patient notified and verbalized understanding. 

## 2017-11-29 ENCOUNTER — Encounter: Payer: Self-pay | Admitting: Cardiovascular Disease

## 2017-11-29 ENCOUNTER — Ambulatory Visit: Payer: Medicare Other | Admitting: Cardiovascular Disease

## 2017-11-29 VITALS — BP 122/62 | HR 75 | Ht 63.5 in | Wt 127.0 lb

## 2017-11-29 DIAGNOSIS — I493 Ventricular premature depolarization: Secondary | ICD-10-CM | POA: Diagnosis not present

## 2017-11-29 DIAGNOSIS — R002 Palpitations: Secondary | ICD-10-CM | POA: Diagnosis not present

## 2017-11-29 DIAGNOSIS — I25118 Atherosclerotic heart disease of native coronary artery with other forms of angina pectoris: Secondary | ICD-10-CM

## 2017-11-29 DIAGNOSIS — I10 Essential (primary) hypertension: Secondary | ICD-10-CM | POA: Diagnosis not present

## 2017-11-29 DIAGNOSIS — E7800 Pure hypercholesterolemia, unspecified: Secondary | ICD-10-CM

## 2017-11-29 NOTE — Progress Notes (Signed)
SUBJECTIVE: The patient presents for follow-up of nonobstructive coronary disease and symptomatic PVCs. Cardiac catheterization was performed in 2013 after an abnormal stress test and showed mild to moderate nonobstructive CAD with heavy calcifications. Left ventricular systolic function was normal.   She has a history of severe hyperlipidemia with statin intolerance but has tolerated low-dose Crestor with Zetia.  She has a long history of intermittent atypical chest pain with most recent nuclear stress test in January 2017 showing no evidence of ischemia with normal ejection fraction, overall a low risk study.  She did not respond well to Ranexa.  Her husband, Marcello Moores, is also my patient.  She denies chest pain.  She recently got treated for a sinus infection with doxycycline and has had a residual cough.  She also has seasonal allergies and takes Zyrtec as needed.  She broke her left foot in March and this has been her primary health issue.  She likes to walk but has been unable to walk as much as she would like.  She enjoys being active.  She and her husband go out for lunch quite frequently and take drives in their spare time.  ECG performed in the office today which I personally reviewed demonstrates sinus rhythm with septal Q waves.      Review of Systems: As per "subjective", otherwise negative.  Allergies  Allergen Reactions  . Atorvastatin     myalgia  . Clindamycin/Lincomycin Other (See Comments)    c-diff   . Levaquin [Levofloxacin In D5w] Itching    At IV site including red streaking  . Morphine Other (See Comments)    REACTION: Patient states that her mother coded when given this medication therefore patient refuses to take  . Ceclor [Cefaclor] Rash  . Cephalosporins Rash  . Keflex [Cephalexin] Rash  . Penicillins Hives and Rash    Has patient had a PCN reaction causing immediate rash, facial/tongue/throat swelling, SOB or lightheadedness with hypotension:  Yes Has patient had a PCN reaction causing severe rash involving mucus membranes or skin necrosis: No Has patient had a PCN reaction that required hospitalization: No Has patient had a PCN reaction occurring within the last 10 years: No If all of the above answers are "NO", then may proceed with Cephalosporin use.     Current Outpatient Medications  Medication Sig Dispense Refill  . aspirin 81 MG tablet Take 1 tablet (81 mg total) by mouth daily. 90 tablet 3  . esomeprazole (NEXIUM) 40 MG capsule Take 40 mg by mouth daily.   3  . ezetimibe (ZETIA) 10 MG tablet Take 10 mg by mouth every morning.     . gabapentin (NEURONTIN) 300 MG capsule Take 300 mg by mouth at bedtime.     . isosorbide mononitrate (IMDUR) 60 MG 24 hr tablet TAKE 1 TABLET BY MOUTH DAILY 90 tablet 0  . LUMIGAN 0.01 % SOLN Apply 1 drop to eye at bedtime.     . metoprolol succinate (TOPROL-XL) 50 MG 24 hr tablet Take 25 mg by mouth daily. Take with or immediately following a meal.    . nitroGLYCERIN (NITROSTAT) 0.4 MG SL tablet Place 0.4 mg under the tongue every 5 (five) minutes as needed for chest pain.    Marland Kitchen olmesartan-hydrochlorothiazide (BENICAR HCT) 40-12.5 MG per tablet Take 0.5 tablets by mouth daily.     . ranitidine (ZANTAC) 150 MG tablet Take 150 mg by mouth as needed for heartburn.    . rosuvastatin (CRESTOR) 5 MG tablet  Take 1 tablet (5 mg total) by mouth daily. 30 tablet 0  . Wheat Dextrin (BENEFIBER DRINK MIX PO) Take 1 Package by mouth daily.      No current facility-administered medications for this visit.     Past Medical History:  Diagnosis Date  . Chest pain   . Coronary artery disease    cardiac cath 08/2011: heavily calcified coronary arteries without obstructive disease. 40% proximal LAD, 40% in OMs.   . GERD (gastroesophageal reflux disease)   . History of nuclear stress test 2017   low risk study  . Hyperlipidemia   . Hypertension   . Post concussion syndrome   . PVC's (premature ventricular  contractions)    PVCs and ventricular bigeminy with abnormal nuclear test    Past Surgical History:  Procedure Laterality Date  . BREAST CYST EXCISION  benign right breast   1982  . CARDIAC CATHETERIZATION  07/2011   [facility]; [facility]  . GALLBLADDER SURGERY  2003  . ROTATOR CUFF REPAIR  right 1989  . TUBAL LIGATION  1974    Social History   Socioeconomic History  . Marital status: Married    Spouse name: Not on file  . Number of children: Not on file  . Years of education: Not on file  . Highest education level: Not on file  Occupational History  . Occupation: former Animal nutritionist  . Financial resource strain: Not on file  . Food insecurity:    Worry: Not on file    Inability: Not on file  . Transportation needs:    Medical: Not on file    Non-medical: Not on file  Tobacco Use  . Smoking status: Former Smoker    Packs/day: 1.00    Years: 16.00    Pack years: 16.00    Types: Cigarettes    Last attempt to quit: 06/29/1972    Years since quitting: 45.4  . Smokeless tobacco: Never Used  Substance and Sexual Activity  . Alcohol use: No  . Drug use: No  . Sexual activity: Not on file  Lifestyle  . Physical activity:    Days per week: Not on file    Minutes per session: Not on file  . Stress: Not on file  Relationships  . Social connections:    Talks on phone: Not on file    Gets together: Not on file    Attends religious service: Not on file    Active member of club or organization: Not on file    Attends meetings of clubs or organizations: Not on file    Relationship status: Not on file  . Intimate partner violence:    Fear of current or ex partner: Not on file    Emotionally abused: Not on file    Physically abused: Not on file    Forced sexual activity: Not on file  Other Topics Concern  . Not on file  Social History Narrative  . Not on file     Vitals:   11/29/17 1009  BP: 122/62  Pulse: 75  SpO2: 94%  Weight: 127 lb (57.6 kg)  Height: 5' 3.5" (1.613  m)    Wt Readings from Last 3 Encounters:  11/29/17 127 lb (57.6 kg)  11/25/16 127 lb (57.6 kg)  05/18/16 126 lb 8 oz (57.4 kg)     PHYSICAL EXAM General: NAD HEENT: Normal. Neck: No JVD, no thyromegaly. Lungs: Clear to auscultation bilaterally with normal respiratory effort. CV: Regular rate and rhythm, normal  S1/S2, no S3/S4, no murmur. No pretibial or periankle edema.  No carotid bruit.   Abdomen: Soft, nontender, no distention.  Neurologic: Alert and oriented.  Psych: Normal affect. Skin: Normal. Musculoskeletal: No gross deformities.    ECG: Most recent ECG reviewed.   Labs: Lab Results  Component Value Date/Time   K 3.6 02/20/2017 06:15 PM   BUN 13 02/20/2017 06:15 PM   CREATININE 0.89 02/20/2017 06:15 PM   ALT 16 02/20/2017 06:15 PM   HGB 13.1 02/20/2017 06:15 PM     Lipids: No results found for: LDLCALC, LDLDIRECT, CHOL, TRIG, HDL     ASSESSMENT AND PLAN:  1. Coronary artery disease (nonobstructive): Symptomatically stable on aspirin, indoor 60 mg, Toprol-XL 25 mg, and Crestor 5 mg.  No changes to therapy.  2. Hyperlipidemia: Continue Crestor 5 mg and Zetia.  3. Symptomatic PVCs: Symptomatically stable on Toprol-XL 25 mg daily. No changes.  4. Essential hypertension: Controlled on present therapy. No changes.     Disposition: Follow up 1 year   Kate Sable, M.D., F.A.C.C.

## 2017-11-29 NOTE — Progress Notes (Signed)
ekg 

## 2017-11-29 NOTE — Patient Instructions (Addendum)

## 2018-04-07 ENCOUNTER — Ambulatory Visit (INDEPENDENT_AMBULATORY_CARE_PROVIDER_SITE_OTHER): Payer: Medicare Other | Admitting: Internal Medicine

## 2018-04-07 ENCOUNTER — Encounter (INDEPENDENT_AMBULATORY_CARE_PROVIDER_SITE_OTHER): Payer: Self-pay | Admitting: Internal Medicine

## 2018-04-07 VITALS — BP 138/78 | HR 72 | Temp 97.9°F | Ht 63.5 in | Wt 121.8 lb

## 2018-04-07 DIAGNOSIS — K219 Gastro-esophageal reflux disease without esophagitis: Secondary | ICD-10-CM

## 2018-04-07 DIAGNOSIS — K317 Polyp of stomach and duodenum: Secondary | ICD-10-CM

## 2018-04-07 NOTE — Progress Notes (Addendum)
Subjective:    Patient ID: Heidi Wallace, female    DOB: 08-May-1936, 82 y.o.   MRN: [id number]  HPI Referred by dr. Quintin Alto for abdominal pain after eating.Symptoms for 2 months.  She tells me she has been having stomach problems.  She has lower abdominal pain after she eats. She says it may hurt for several minutes. Has taken Nexium for over 20 yrs. She says if she takes the Nexium she seems to be fine. If she doesn't take the Nexium, she has a lot of acid reflux. Her last colonoscopy was in 2009. Her appetite is better. No weight loss.  No change in BMs. No melena or BRRB.  Father had rectal cancer in his 69s and she had a 1st cousin with colon cancer who died from this.      03-31-08 Eulis Canner MD.  EGD/Colonoscopy: Multiple polyps in the proximal stomach, (polypectomy). Phyrangeal tightness noted. Dilated to 4 [place] with minimal resistance, No complication. A small hiatial hernia and weak LES but no obvious changes of acid reflux damage. Colonoscopy: Minimal diverticulosis noted without diverticulitis. Otherwise normal.    Hx of GERD, hypertension, CAD   Review of Systems Past Medical History:  Diagnosis Date  . Chest pain   . Coronary artery disease    cardiac cath 08/2011: heavily calcified coronary arteries without obstructive disease. 40% proximal LAD, 40% in OMs.   . GERD (gastroesophageal reflux disease)   . History of nuclear stress test 2017   low risk study  . Hyperlipidemia   . Hypertension   . Post concussion syndrome   . PVC's (premature ventricular contractions)    PVCs and ventricular bigeminy with abnormal nuclear test    Past Surgical History:  Procedure Laterality Date  . BREAST CYST EXCISION  benign right breast   1982  . CARDIAC CATHETERIZATION  07/2011   [facility]; [facility]  . GALLBLADDER SURGERY  2003  . ROTATOR CUFF REPAIR  right 1989  . TUBAL LIGATION  1974    Allergies  Allergen Reactions  . Atorvastatin     myalgia  .  Clindamycin/Lincomycin Other (See Comments)    c-diff   . Levaquin [Levofloxacin In D5w] Itching    At IV site including red streaking  . Morphine Other (See Comments)    REACTION: Patient states that her mother coded when given this medication therefore patient refuses to take  . Ceclor [Cefaclor] Rash  . Cephalosporins Rash  . Keflex [Cephalexin] Rash  . Penicillins Hives and Rash    Has patient had a PCN reaction causing immediate rash, facial/tongue/throat swelling, SOB or lightheadedness with hypotension: Yes Has patient had a PCN reaction causing severe rash involving mucus membranes or skin necrosis: No Has patient had a PCN reaction that required hospitalization: No Has patient had a PCN reaction occurring within the last 10 years: No If all of the above answers are "NO", then may proceed with Cephalosporin use.     Current Outpatient Medications on File Prior to Visit  Medication Sig Dispense Refill  . aspirin 81 MG tablet Take 1 tablet (81 mg total) by mouth daily. 90 tablet 3  . cetirizine (ZYRTEC) 10 MG tablet Take 10 mg by mouth as needed for allergies.    . citalopram (CELEXA) 10 MG tablet Take 10 mg by mouth as needed.    Marland Kitchen esomeprazole (NEXIUM) 40 MG capsule Take 40 mg by mouth daily.   3  . ezetimibe (ZETIA) 10 MG tablet Take 10 mg  by mouth every morning.     . fluticasone (VERAMYST) 27.5 MCG/SPRAY nasal spray Place 2 sprays into the nose daily.    Marland Kitchen gabapentin (NEURONTIN) 300 MG capsule Take 300 mg by mouth at bedtime.     Marland Kitchen ipratropium (ATROVENT) 0.06 % nasal spray Place 2 sprays into both nostrils 2 (two) times daily.    . isosorbide mononitrate (IMDUR) 60 MG 24 hr tablet TAKE 1 TABLET BY MOUTH DAILY 90 tablet 0  . metoprolol succinate (TOPROL-XL) 50 MG 24 hr tablet Take 50 mg by mouth daily. 1/2 tab daily at 5 pm.    . olmesartan-hydrochlorothiazide (BENICAR HCT) 40-12.5 MG per tablet Take 0.5 tablets by mouth daily.     Marland Kitchen olmesartan-hydrochlorothiazide (BENICAR  HCT) 40-12.5 MG tablet Take 1 tablet by mouth daily. 1/2  Tab daily    . rosuvastatin (CRESTOR) 5 MG tablet Take 1 tablet (5 mg total) by mouth daily. 30 tablet 0  . travoprost, benzalkonium, (TRAVATAN) 0.004 % ophthalmic solution 1 drop at bedtime.    . Wheat Dextrin (BENEFIBER DRINK MIX PO) Take 1 Package by mouth daily.      No current facility-administered medications on file prior to visit.         Objective:   Physical Exam Blood pressure 138/78, pulse 72, temperature 97.9 F (36.6 C), height 5' 3.5" (1.613 m), weight 121 lb 12.8 oz (55.2 kg). Alert and oriented. Skin warm and dry. Oral mucosa is moist.   . Sclera anicteric, conjunctivae is pink. Thyroid not enlarged. No cervical lymphadenopathy. Lungs clear. Heart regular rate and rhythm.  Abdomen is soft. Bowel sounds are positive. No hepatomegaly. No abdominal masses felt. No tenderness.  No edema to lower extremities.           Assessment & Plan:  GERD. Continue the Nexium. Screening colonoscopy. Her last colonoscopy in 2009. She will let me know if she wants to proceed with a colonoscopy.  Family hx of colon cancer in a 1st cousin and father had rectal cancer.

## 2018-04-07 NOTE — Patient Instructions (Addendum)
Continue the Nexium. She will let me know if she wants to proceed with a colonoscopy.

## 2018-04-11 ENCOUNTER — Telehealth (INDEPENDENT_AMBULATORY_CARE_PROVIDER_SITE_OTHER): Payer: Self-pay | Admitting: Internal Medicine

## 2018-04-11 ENCOUNTER — Other Ambulatory Visit (INDEPENDENT_AMBULATORY_CARE_PROVIDER_SITE_OTHER): Payer: Self-pay | Admitting: Internal Medicine

## 2018-04-11 DIAGNOSIS — K317 Polyp of stomach and duodenum: Secondary | ICD-10-CM

## 2018-04-11 DIAGNOSIS — Z1211 Encounter for screening for malignant neoplasm of colon: Secondary | ICD-10-CM

## 2018-04-11 NOTE — Telephone Encounter (Signed)
Lelon Frohlich, EGD/Colonoscopy

## 2018-04-11 NOTE — Telephone Encounter (Signed)
Ann, EGD, Colonoscopy.

## 2018-04-11 NOTE — Telephone Encounter (Signed)
EGD and colonoscopy scheduled.

## 2018-04-11 NOTE — Telephone Encounter (Signed)
Patient would like for you to call her at [phone]

## 2018-04-12 ENCOUNTER — Encounter (INDEPENDENT_AMBULATORY_CARE_PROVIDER_SITE_OTHER): Payer: Self-pay | Admitting: *Deleted

## 2018-04-12 ENCOUNTER — Telehealth (INDEPENDENT_AMBULATORY_CARE_PROVIDER_SITE_OTHER): Payer: Self-pay | Admitting: *Deleted

## 2018-04-12 NOTE — Telephone Encounter (Signed)
TCS/EGD sch'd 07/07/17, patient aware, instructions mailed

## 2018-04-12 NOTE — Telephone Encounter (Signed)
Patient needs suprep 

## 2018-04-13 MED ORDER — SUPREP BOWEL PREP KIT 17.5-3.13-1.6 GM/177ML PO SOLN: 1.0000 | Freq: Once | ORAL | 0 refills | Status: AC

## 2018-04-14 ENCOUNTER — Telehealth (INDEPENDENT_AMBULATORY_CARE_PROVIDER_SITE_OTHER): Payer: Self-pay | Admitting: *Deleted

## 2018-04-14 DIAGNOSIS — Z1211 Encounter for screening for malignant neoplasm of colon: Secondary | ICD-10-CM | POA: Insufficient documentation

## 2018-04-14 DIAGNOSIS — K317 Polyp of stomach and duodenum: Secondary | ICD-10-CM | POA: Insufficient documentation

## 2018-04-14 NOTE — Telephone Encounter (Signed)
She was called and a message was left on her voicemail with the results of the hemoccults.

## 2018-04-14 NOTE — Telephone Encounter (Signed)
Please let patient know stool cards were negative

## 2018-04-14 NOTE — Telephone Encounter (Signed)
   Diagnosis:    Result(s)   Card 1:Negative: 04/11/2018    Card 2:Negative:04/12/2018   Card 3:Negative:04/13/2018    Completed by: Thomas Hoff, LPN   HEMOCCULT SENSA DEVELOPER: LOT#:  [id number] EXPIRATION DATE: 2021-11   HEMOCCULT SENSA CARD:  LOT#: [id number] 2L EXPIRATION DATE: 05/21   CARD CONTROL RESULTS:  POSITIVE: Postive NEGATIVE: Negative    ADDITIONAL COMMENTS: Patient was called,message was left on answering machine.with  results. Forwarded to ordering provider , Lelon Perla

## 2018-05-01 DIAGNOSIS — I499 Cardiac arrhythmia, unspecified: Secondary | ICD-10-CM | POA: Diagnosis not present

## 2018-05-01 DIAGNOSIS — R0789 Other chest pain: Secondary | ICD-10-CM | POA: Diagnosis not present

## 2018-05-01 DIAGNOSIS — R140 Abdominal distension (gaseous): Secondary | ICD-10-CM | POA: Diagnosis not present

## 2018-06-07 ENCOUNTER — Encounter: Payer: Self-pay | Admitting: Pulmonary Disease

## 2018-06-07 ENCOUNTER — Ambulatory Visit: Payer: Medicare Other | Admitting: Pulmonary Disease

## 2018-06-07 VITALS — BP 146/78 | HR 71 | Ht 63.0 in | Wt 122.0 lb

## 2018-06-07 DIAGNOSIS — R05 Cough: Secondary | ICD-10-CM | POA: Diagnosis not present

## 2018-06-07 DIAGNOSIS — K219 Gastro-esophageal reflux disease without esophagitis: Secondary | ICD-10-CM | POA: Diagnosis not present

## 2018-06-07 DIAGNOSIS — R059 Cough, unspecified: Secondary | ICD-10-CM

## 2018-06-07 DIAGNOSIS — R911 Solitary pulmonary nodule: Secondary | ICD-10-CM

## 2018-06-07 DIAGNOSIS — I10 Essential (primary) hypertension: Secondary | ICD-10-CM | POA: Diagnosis not present

## 2018-06-07 MED ORDER — BUDESONIDE-FORMOTEROL FUMARATE 80-4.5 MCG/ACT IN AERO: 2.0000 | INHALATION_SPRAY | Freq: Two times a day (BID) | RESPIRATORY_TRACT | 0 refills | Status: DC

## 2018-06-07 NOTE — Patient Instructions (Addendum)
Thank you for visiting Dr. Valeta Harms at [facility]. Today we recommend the following:  Meds ordered this encounter  Medications  . budesonide-formoterol (SYMBICORT) 80-4.5 MCG/ACT inhaler    Sig: Inhale 2 puffs into the lungs every 12 (twelve) hours.    Dispense:  1 Inhaler    Refill:  0   Return if symptoms worsen or fail to improve.

## 2018-06-07 NOTE — Progress Notes (Signed)
Synopsis: Referred in dec 2019 for chronic cough  by Manon Hilding, MD  Subjective:   PATIENT ID: Heidi Wallace GENDER: female DOB: [birth year], MRN: [id number]  Chief Complaint  Patient presents with  . Consult    States she has a dry cough for several years. She has been told its allergy and reflux however nothing has helped. States she sometimes cough so much she looses her breath and if she holds her head down it triggers the cough.     PMH GERD, HTN, HLD. She saw ENT in the past for recurrent laryngitis. She has had cough for the past several years. She has much difficulty with chronic cough. She sings in her church choir and has noticed worsening cough. She has been on PPI for the past 20 years. She always carries a bottle of water with her. The cough is dry and non-productive. She married her high school sweet heart 20 years ago after both of there former spouses passed away.  Patient denies hemoptysis.  Denies her weight loss.  She has been treated with nasal steroids as well as albuterol as well as longtime PPI.  She does only feel that her cough is worse with singing.  And vocalization.    Past Medical History:  Diagnosis Date  . Chest pain   . Coronary artery disease    cardiac cath 08/2011: heavily calcified coronary arteries without obstructive disease. 40% proximal LAD, 40% in OMs.   . GERD (gastroesophageal reflux disease)   . History of nuclear stress test 2017   low risk study  . Hyperlipidemia   . Hypertension   . Post concussion syndrome   . PVC's (premature ventricular contractions)    PVCs and ventricular bigeminy with abnormal nuclear test     Family History  Problem Relation Age of Onset  . Heart disease Father   . Heart disease Mother      Past Surgical History:  Procedure Laterality Date  . BREAST CYST EXCISION  benign right breast   1982  . CARDIAC CATHETERIZATION  07/2011   [facility]; [facility]  . GALLBLADDER SURGERY  2003  . ROTATOR CUFF REPAIR  right  1989  . TUBAL LIGATION  1974    Social History   Socioeconomic History  . Marital status: Married    Spouse name: Not on file  . Number of children: Not on file  . Years of education: Not on file  . Highest education level: Not on file  Occupational History  . Occupation: former Animal nutritionist  . Financial resource strain: Not on file  . Food insecurity:    Worry: Not on file    Inability: Not on file  . Transportation needs:    Medical: Not on file    Non-medical: Not on file  Tobacco Use  . Smoking status: Former Smoker    Packs/day: 1.00    Years: 16.00    Pack years: 16.00    Types: Cigarettes    Last attempt to quit: 06/29/1972    Years since quitting: 45.9  . Smokeless tobacco: Never Used  Substance and Sexual Activity  . Alcohol use: No  . Drug use: No  . Sexual activity: Not on file  Lifestyle  . Physical activity:    Days per week: Not on file    Minutes per session: Not on file  . Stress: Not on file  Relationships  . Social connections:    Talks on phone: Not on file  Gets together: Not on file    Attends religious service: Not on file    Active member of club or organization: Not on file    Attends meetings of clubs or organizations: Not on file    Relationship status: Not on file  . Intimate partner violence:    Fear of current or ex partner: Not on file    Emotionally abused: Not on file    Physically abused: Not on file    Forced sexual activity: Not on file  Other Topics Concern  . Not on file  Social History Narrative  . Not on file     Allergies  Allergen Reactions  . Atorvastatin     myalgia  . Clindamycin/Lincomycin Other (See Comments)    c-diff   . Levaquin [Levofloxacin In D5w] Itching    At IV site including red streaking  . Morphine Other (See Comments)    REACTION: Patient states that her mother coded when given this medication therefore patient refuses to take  . Ceclor [Cefaclor] Rash  . Cephalosporins Rash  . Keflex  [Cephalexin] Rash  . Penicillins Hives and Rash    Has patient had a PCN reaction causing immediate rash, facial/tongue/throat swelling, SOB or lightheadedness with hypotension: Yes Has patient had a PCN reaction causing severe rash involving mucus membranes or skin necrosis: No Has patient had a PCN reaction that required hospitalization: No Has patient had a PCN reaction occurring within the last 10 years: No If all of the above answers are "NO", then may proceed with Cephalosporin use.      Outpatient Medications Prior to Visit  Medication Sig Dispense Refill  . aspirin 81 MG tablet Take 1 tablet (81 mg total) by mouth daily. 90 tablet 3  . cetirizine (ZYRTEC) 10 MG tablet Take 10 mg by mouth as needed for allergies.    Marland Kitchen esomeprazole (NEXIUM) 40 MG capsule Take 40 mg by mouth daily.   3  . ezetimibe (ZETIA) 10 MG tablet Take 10 mg by mouth every morning.     . fluticasone (VERAMYST) 27.5 MCG/SPRAY nasal spray Place 2 sprays into the nose daily.    Marland Kitchen gabapentin (NEURONTIN) 300 MG capsule Take 300 mg by mouth at bedtime.     . isosorbide mononitrate (IMDUR) 60 MG 24 hr tablet TAKE 1 TABLET BY MOUTH DAILY 90 tablet 0  . metoprolol succinate (TOPROL-XL) 50 MG 24 hr tablet Take 50 mg by mouth daily. 1/2 tab daily at 5 pm.    . olmesartan-hydrochlorothiazide (BENICAR HCT) 40-12.5 MG per tablet Take 0.5 tablets by mouth daily.     . rosuvastatin (CRESTOR) 5 MG tablet Take 1 tablet (5 mg total) by mouth daily. 30 tablet 0  . travoprost, benzalkonium, (TRAVATAN) 0.004 % ophthalmic solution 1 drop at bedtime.    . Wheat Dextrin (BENEFIBER DRINK MIX PO) Take 1 Package by mouth daily.     . citalopram (CELEXA) 10 MG tablet Take 10 mg by mouth as needed.    Marland Kitchen ipratropium (ATROVENT) 0.06 % nasal spray Place 2 sprays into both nostrils 2 (two) times daily.    Marland Kitchen olmesartan-hydrochlorothiazide (BENICAR HCT) 40-12.5 MG tablet Take 1 tablet by mouth daily. 1/2  Tab daily     No facility-administered  medications prior to visit.     Review of Systems  Constitutional: Negative.   HENT: Positive for congestion and sore throat. Negative for ear discharge, ear pain, hearing loss, nosebleeds, sinus pain and tinnitus.   Eyes: Negative.  Respiratory: Positive for cough. Negative for hemoptysis, sputum production, shortness of breath, wheezing and stridor.   Cardiovascular: Positive for chest pain. Negative for palpitations, orthopnea, claudication, leg swelling and PND.  Gastrointestinal: Positive for heartburn. Negative for abdominal pain, blood in stool, constipation, diarrhea, melena, nausea and vomiting.  Genitourinary: Negative.   Musculoskeletal: Negative.   Skin: Negative.   Neurological: Negative.   Endo/Heme/Allergies: Negative.   Psychiatric/Behavioral: Negative.      Objective:  Physical Exam  Constitutional: She is oriented to person, place, and time. She appears well-developed and well-nourished. No distress.  HENT:  Head: Normocephalic and atraumatic.  Mouth/Throat: Oropharynx is clear and moist.  Eyes: Pupils are equal, round, and reactive to light. Conjunctivae are normal. No scleral icterus.  Neck: Neck supple. No JVD present. No tracheal deviation present.  Cardiovascular: Normal rate, regular rhythm, normal heart sounds and intact distal pulses.  No murmur heard. Pulmonary/Chest: Effort normal and breath sounds normal. No accessory muscle usage or stridor. No tachypnea. No respiratory distress. She has no wheezes. She has no rhonchi. She has no rales.  Abdominal: Soft. Bowel sounds are normal. She exhibits no distension. There is no tenderness.  Musculoskeletal: She exhibits no edema or tenderness.  Lymphadenopathy:    She has no cervical adenopathy.  Neurological: She is alert and oriented to person, place, and time.  Skin: Skin is warm and dry. Capillary refill takes less than 2 seconds. No rash noted.  Psychiatric: She has a normal mood and affect. Her behavior  is normal.  Vitals reviewed.    Vitals:   06/07/18 0934  BP: (!) 146/78  Pulse: 71  SpO2: 97%  Weight: 122 lb (55.3 kg)  Height: 5\' 3"  (1.6 m)   97% on RA BMI Readings from Last 3 Encounters:  06/07/18 21.61 kg/m  04/07/18 21.24 kg/m  11/29/17 22.14 kg/m   Wt Readings from Last 3 Encounters:  06/07/18 122 lb (55.3 kg)  04/07/18 121 lb 12.8 oz (55.2 kg)  11/29/17 127 lb (57.6 kg)     CBC    Component Value Date/Time   WBC 8.5 02/20/2017 1815   RBC 4.10 02/20/2017 1815   HGB 13.1 02/20/2017 1815   HCT 38.6 02/20/2017 1815   PLT 273 02/20/2017 1815   MCV 94.1 02/20/2017 1815   MCH 32.0 02/20/2017 1815   MCHC 33.9 02/20/2017 1815   RDW 12.8 02/20/2017 1815    Chest Imaging: 01/2017: CXR - left basilar atelectasis, kyphosis The patient's images have been independently reviewed by me.    CT chest 2018 reviewed from rocking him. Small Pulmonary nodule The patient's images have been independently reviewed by me.    Pulmonary Functions Testing Results: None   FeNO: None   Pathology: None   Echocardiogram: None   Heart Catheterization: None     Assessment & Plan:   Cough  Gastroesophageal reflux disease without esophagitis  Essential hypertension  Lung nodule  Discussion:  This is a 82 year old female with a history of chronic cough for the past 20 years.  She states her cough is worse while she is at church and trying to sing.  She has had multiple office visits throughout the years regarding cough.  She is also on gabapentin at this time due to history of migraine post fall concussion.  She has used intranasal steroids as well as antihistamines.  She has been on Nexium for the past 20+ years.  She has recurrent dry cough that she feels like she has to clear  her throat.  She carries a bottle of water with her all of the time.  After such a prolonged history and associated with singing and vocalization one must consider diagnosis of VCD.  However, the  patient has seen ENT in the past.  In the office I discussed utility of PFTs.  I was able to review a CT of the chest that was completed in 2018.  No obvious reason for cough at this time imaging.  Patient quit smoking in 1974.  She is a retired Marine scientist.  Current with active [employer].  After discussion about PFTs patient has declined evaluation with pulmonary function tests which I agree is reasonable. We will give her a sample of Symbicort to see if it is helpful at all with her cough.  She has used albuterol in the past which made little to no difference.  I did discuss with her the mechanisms of chronic cough to include a chronically stimulated neural pathway and reflexive cough.  I counseled the patient on not planning her day around to having cough. Patient states that her primary feels as if she has created coughing as a habit.  Patient to return to clinic as needed.  Greater than 50% of the patient's 60-minute office visit was spent face-to-face discussing the above recommendations treatment.   Current Outpatient Medications:  .  aspirin 81 MG tablet, Take 1 tablet (81 mg total) by mouth daily., Disp: 90 tablet, Rfl: 3 .  cetirizine (ZYRTEC) 10 MG tablet, Take 10 mg by mouth as needed for allergies., Disp: , Rfl:  .  esomeprazole (NEXIUM) 40 MG capsule, Take 40 mg by mouth daily. , Disp: , Rfl: 3 .  ezetimibe (ZETIA) 10 MG tablet, Take 10 mg by mouth every morning. , Disp: , Rfl:  .  fluticasone (VERAMYST) 27.5 MCG/SPRAY nasal spray, Place 2 sprays into the nose daily., Disp: , Rfl:  .  gabapentin (NEURONTIN) 300 MG capsule, Take 300 mg by mouth at bedtime. , Disp: , Rfl:  .  isosorbide mononitrate (IMDUR) 60 MG 24 hr tablet, TAKE 1 TABLET BY MOUTH DAILY, Disp: 90 tablet, Rfl: 0 .  metoprolol succinate (TOPROL-XL) 50 MG 24 hr tablet, Take 50 mg by mouth daily. 1/2 tab daily at 5 pm., Disp: , Rfl:  .  olmesartan-hydrochlorothiazide (BENICAR HCT) 40-12.5 MG per  tablet, Take 0.5 tablets by mouth daily. , Disp: , Rfl:  .  rosuvastatin (CRESTOR) 5 MG tablet, Take 1 tablet (5 mg total) by mouth daily., Disp: 30 tablet, Rfl: 0 .  travoprost, benzalkonium, (TRAVATAN) 0.004 % ophthalmic solution, 1 drop at bedtime., Disp: , Rfl:  .  Wheat Dextrin (BENEFIBER DRINK MIX PO), Take 1 Package by mouth daily. , Disp: , Rfl:  .  citalopram (CELEXA) 10 MG tablet, Take 10 mg by mouth as needed., Disp: , Rfl:  .  ipratropium (ATROVENT) 0.06 % nasal spray, Place 2 sprays into both nostrils 2 (two) times daily., Disp: , Rfl:    Garner Nash, DO [facility] 06/07/2018 9:43 AM

## 2018-06-14 ENCOUNTER — Telehealth (INDEPENDENT_AMBULATORY_CARE_PROVIDER_SITE_OTHER): Payer: Self-pay | Admitting: *Deleted

## 2018-06-14 NOTE — Telephone Encounter (Signed)
Patient is scheduled for TCS/EGD 07/07/18  FYI: Had laparoscopic surgery in 70s which caused adhesions  on last TCS the doctor accidently tore the adhesions which caused extreme abd pain which lasted for 2 mths  - this was seen on CT abd

## 2018-07-07 ENCOUNTER — Encounter (HOSPITAL_COMMUNITY): Payer: Self-pay | Admitting: *Deleted

## 2018-07-07 ENCOUNTER — Encounter (HOSPITAL_COMMUNITY)
Admission: RE | Disposition: A | Discharge status: Home / Self Care | Payer: Self-pay | Source: Home / Self Care | Attending: Internal Medicine

## 2018-07-07 ENCOUNTER — Ambulatory Visit (HOSPITAL_COMMUNITY)
Admission: RE | Admit: 2018-07-07 | Discharge: 2018-07-07 | Disposition: A | Discharge status: Home / Self Care | Payer: Medicare Other | Attending: Internal Medicine | Admitting: Internal Medicine

## 2018-07-07 ENCOUNTER — Other Ambulatory Visit: Payer: Self-pay

## 2018-07-07 DIAGNOSIS — Z1211 Encounter for screening for malignant neoplasm of colon: Secondary | ICD-10-CM | POA: Insufficient documentation

## 2018-07-07 DIAGNOSIS — K219 Gastro-esophageal reflux disease without esophagitis: Secondary | ICD-10-CM | POA: Diagnosis not present

## 2018-07-07 DIAGNOSIS — I2510 Atherosclerotic heart disease of native coronary artery without angina pectoris: Secondary | ICD-10-CM | POA: Insufficient documentation

## 2018-07-07 DIAGNOSIS — Z9049 Acquired absence of other specified parts of digestive tract: Secondary | ICD-10-CM | POA: Diagnosis not present

## 2018-07-07 DIAGNOSIS — Z881 Allergy status to other antibiotic agents status: Secondary | ICD-10-CM | POA: Insufficient documentation

## 2018-07-07 DIAGNOSIS — E785 Hyperlipidemia, unspecified: Secondary | ICD-10-CM | POA: Diagnosis not present

## 2018-07-07 DIAGNOSIS — Z79899 Other long term (current) drug therapy: Secondary | ICD-10-CM | POA: Diagnosis not present

## 2018-07-07 DIAGNOSIS — I129 Hypertensive chronic kidney disease with stage 1 through stage 4 chronic kidney disease, or unspecified chronic kidney disease: Secondary | ICD-10-CM | POA: Insufficient documentation

## 2018-07-07 DIAGNOSIS — N189 Chronic kidney disease, unspecified: Secondary | ICD-10-CM | POA: Diagnosis not present

## 2018-07-07 DIAGNOSIS — I493 Ventricular premature depolarization: Secondary | ICD-10-CM | POA: Insufficient documentation

## 2018-07-07 DIAGNOSIS — D122 Benign neoplasm of ascending colon: Secondary | ICD-10-CM

## 2018-07-07 DIAGNOSIS — Z880 Allergy status to penicillin: Secondary | ICD-10-CM | POA: Insufficient documentation

## 2018-07-07 DIAGNOSIS — R05 Cough: Secondary | ICD-10-CM

## 2018-07-07 DIAGNOSIS — Z7982 Long term (current) use of aspirin: Secondary | ICD-10-CM | POA: Insufficient documentation

## 2018-07-07 DIAGNOSIS — K228 Other specified diseases of esophagus: Secondary | ICD-10-CM

## 2018-07-07 DIAGNOSIS — K6289 Other specified diseases of anus and rectum: Secondary | ICD-10-CM | POA: Insufficient documentation

## 2018-07-07 DIAGNOSIS — Z87891 Personal history of nicotine dependence: Secondary | ICD-10-CM | POA: Diagnosis not present

## 2018-07-07 DIAGNOSIS — D120 Benign neoplasm of cecum: Secondary | ICD-10-CM | POA: Insufficient documentation

## 2018-07-07 DIAGNOSIS — Z8249 Family history of ischemic heart disease and other diseases of the circulatory system: Secondary | ICD-10-CM | POA: Diagnosis not present

## 2018-07-07 DIAGNOSIS — Z800 Family history of malignant neoplasm of digestive organs: Secondary | ICD-10-CM | POA: Insufficient documentation

## 2018-07-07 DIAGNOSIS — K317 Polyp of stomach and duodenum: Secondary | ICD-10-CM | POA: Diagnosis not present

## 2018-07-07 DIAGNOSIS — Z885 Allergy status to narcotic agent status: Secondary | ICD-10-CM | POA: Insufficient documentation

## 2018-07-07 DIAGNOSIS — Z888 Allergy status to other drugs, medicaments and biological substances status: Secondary | ICD-10-CM | POA: Diagnosis not present

## 2018-07-07 DIAGNOSIS — K5520 Angiodysplasia of colon without hemorrhage: Secondary | ICD-10-CM | POA: Insufficient documentation

## 2018-07-07 HISTORY — PX: BIOPSY: SHX5522

## 2018-07-07 HISTORY — DX: Headache, unspecified: R51.9

## 2018-07-07 HISTORY — PX: POLYPECTOMY: SHX5525

## 2018-07-07 HISTORY — DX: Headache: R51

## 2018-07-07 HISTORY — DX: Chronic kidney disease, unspecified: N18.9

## 2018-07-07 HISTORY — PX: ESOPHAGOGASTRODUODENOSCOPY: SHX5428

## 2018-07-07 HISTORY — PX: COLONOSCOPY: SHX5424

## 2018-07-07 SURGERY — COLONOSCOPY
Anesthesia: Moderate Sedation

## 2018-07-07 MED ORDER — MIDAZOLAM HCL 5 MG/5ML IJ SOLN
INTRAMUSCULAR | Status: DC | PRN
  Administered 2018-07-07: 1 mg via INTRAVENOUS
  Administered 2018-07-07: 2 mg via INTRAVENOUS
  Administered 2018-07-07 (×3): 1 mg via INTRAVENOUS

## 2018-07-07 MED ORDER — MEPERIDINE HCL 50 MG/ML IJ SOLN
INTRAMUSCULAR | Status: DC | PRN
  Administered 2018-07-07 (×2): 25 mg

## 2018-07-07 MED ORDER — STERILE WATER FOR IRRIGATION IR SOLN
Status: DC | PRN
  Administered 2018-07-07: 13:00:00

## 2018-07-07 MED ORDER — FAMOTIDINE 20 MG PO TABS: 20.0000 mg | ORAL_TABLET | Freq: Every day | ORAL | Status: DC

## 2018-07-07 MED ORDER — SODIUM CHLORIDE 0.9 % IV SOLN
INTRAVENOUS | Status: DC
  Administered 2018-07-07: 1000 mL via INTRAVENOUS

## 2018-07-07 MED ORDER — ENALAPRILAT 1.25 MG/ML IV SOLN
INTRAVENOUS | Status: AC
  Filled 2018-07-07: qty 2

## 2018-07-07 MED ORDER — LIDOCAINE VISCOUS HCL 2 % MT SOLN
OROMUCOSAL | Status: DC | PRN
  Administered 2018-07-07: 1 via OROMUCOSAL

## 2018-07-07 MED ORDER — LIDOCAINE VISCOUS HCL 2 % MT SOLN
OROMUCOSAL | Status: AC
  Filled 2018-07-07: qty 15

## 2018-07-07 MED ORDER — MEPERIDINE HCL 50 MG/ML IJ SOLN
INTRAMUSCULAR | Status: AC
  Filled 2018-07-07: qty 1

## 2018-07-07 MED ORDER — MIDAZOLAM HCL 5 MG/5ML IJ SOLN
INTRAMUSCULAR | Status: AC
  Filled 2018-07-07: qty 10

## 2018-07-07 NOTE — Op Note (Signed)
[facility] Patient Name: Heidi Wallace Procedure Date: 07/07/2018 11:25 AM MRN: [id number] Date of Birth: Oct 30, 1935 Attending MD: Hildred Laser , MD CSN: [id number] Age: 83 Admit Type: Outpatient Procedure:                Colonoscopy Indications:              Screening in patient at increased risk: Colorectal                            cancer in father before age 25 Providers:                Hildred Laser, MD, Janeece Riggers, RN, Aram Candela Referring MD:             Manon Hilding, MD Medicines:                None Complications:            No immediate complications. Estimated Blood Loss:     Estimated blood loss: none. Procedure:                Pre-Anesthesia Assessment:                           - Prior to the procedure, a History and Physical                            was performed, and patient medications and                            allergies were reviewed. The patient's tolerance of                            previous anesthesia was also reviewed. The risks                            and benefits of the procedure and the sedation                            options and risks were discussed with the patient.                            All questions were answered, and informed consent                            was obtained. Prior Anticoagulants: The patient has                            taken aspirin, last dose was 2 days prior to                            procedure. ASA Grade Assessment: III - A patient                            with severe systemic disease. After reviewing the  risks and benefits, the patient was deemed in                            satisfactory condition to undergo the procedure.                           After obtaining informed consent, the colonoscope                            was passed under direct vision. Throughout the                            procedure, the patient's blood pressure, pulse, and            oxygen saturations were monitored continuously. The                            PCF-H190DL ([id number]) scope was introduced through                            the anus and advanced to the the cecum, identified                            by appendiceal orifice and ileocecal valve. The                            colonoscopy was performed without difficulty. The                            patient tolerated the procedure well. The quality                            of the bowel preparation was excellent. The                            ileocecal valve, appendiceal orifice, and rectum                            were photographed. Scope In: 1:18:07 PM Scope Out: 1:42:38 PM Scope Withdrawal Time: 0 hours 18 minutes 32 seconds  Total Procedure Duration: 0 hours 24 minutes 31 seconds  Findings:      The perianal and digital rectal examinations were normal.      Two small angioectasias without bleeding were found in the cecum.      A 6 to 10 mm polyp was found in the cecum. The polyp was       multi-lobulated. The polyp was removed with a piecemeal technique using       a hot snare. Resection and retrieval were complete. The pathology       specimen was placed into Bottle Number 2.      A 9 mm polyp was found in the proximal ascending colon. The polyp was       sessile. The polyp was removed with a piecemeal technique using a hot       snare. Resection and retrieval were complete. The pathology specimen was  placed into Bottle Number 2.      The exam was otherwise normal throughout the examined colon.      Anal papilla(e) were hypertrophied. Impression:               - Two non-bleeding colonic angioectasias.                           - One 6 to 10 mm polyp in the cecum, removed                            piecemeal using a hot snare. Resected and retrieved.                           - One 9 mm polyp in the proximal ascending colon,                            removed piecemeal using a hot  snare. Resected and                            retrieved.                           - Anal papilla(e) were hypertrophied. Moderate Sedation:      Moderate (conscious) sedation was administered by the endoscopy nurse       and supervised by the endoscopist. The following parameters were       monitored: oxygen saturation, heart rate, blood pressure, CO2       capnography and response to care. Total physician intraservice time was       28 minutes. Recommendation:           - Patient has a contact number available for                            emergencies. The signs and symptoms of potential                            delayed complications were discussed with the                            patient. Return to normal activities tomorrow.                            Written discharge instructions were provided to the                            patient.                           - Resume previous diet today.                           - Continue present medications.                           - Await pathology results.                           -  No aspirin, ibuprofen, naproxen, or other                            non-steroidal anti-inflammatory drugs for 10 days                            after polyp removal.                           - Await pathology results.                           - No recommendation at this time regarding repeat                            colonoscopy. Procedure Code(s):        --- Professional ---                           [phone], Colonoscopy, flexible; with removal of                            tumor(s), polyp(s), or other lesion(s) by snare                            technique                           99153, Moderate sedation; each additional 15                            minutes intraservice time                           G0500, Moderate sedation services provided by the                            same physician or other qualified health care                             professional performing a gastrointestinal                            endoscopic service that sedation supports,                            requiring the presence of an independent trained                            observer to assist in the monitoring of the                            patient's level of consciousness and physiological                            status; initial 15 minutes of intra-service time;  patient age 79 years or older (additional time may                            be reported with [phone], as appropriate) Diagnosis Code(s):        --- Professional ---                           Z80.0, Family history of malignant neoplasm of                            digestive organs                           K55.20, Angiodysplasia of colon without hemorrhage                           D12.0, Benign neoplasm of cecum                           D12.2, Benign neoplasm of ascending colon                           K62.89, Other specified diseases of anus and rectum CPT copyright 2018 American Medical Association. All rights reserved. The codes documented in this report are preliminary and upon coder review may  be revised to meet current compliance requirements. Hildred Laser, MD Hildred Laser, MD 07/07/2018 1:58:03 PM This report has been signed electronically. Number of Addenda: 0

## 2018-07-07 NOTE — Discharge Instructions (Signed)
No aspirin or NSAIDs for 10 days. Resume medications as before. Resume usual diet. No driving for 24 hours. Patient will call with biopsy results.   Colonoscopy, Adult, Care After This sheet gives you information about how to care for yourself after your procedure. Your health care provider may also give you more specific instructions. If you have problems or questions, contact your health care provider. Dr. Laural Golden:  [phone].  After hours and weekends, call the hospital and have the GI doctor on call paged; they will call you back. What can I expect after the procedure? After the procedure, it is common to have:  A small amount of blood in your stool for 24 hours after the procedure.  Some gas.  Mild abdominal cramping or bloating. Follow these instructions at home: General instructions  For the first 24 hours after the procedure: ? Do not drive or use machinery. ? Do not sign important documents. ? Do not drink alcohol. ? Do your regular daily activities at a slower pace than normal.  Take over-the-counter or prescription medicines only as told by your health care provider.  Eating and drinking   Drink enough fluid to keep your urine pale yellow.  Resume your normal diet as instructed by your health care provider. Avoid heavy or fried foods that are hard to digest. Contact a health care provider if:  You have blood in your stool 2-3 days after the procedure. Get help right away if:  You have more than a small spotting of blood in your stool.  You pass large blood clots in your stool.  Your abdomen is swollen.  You have nausea or vomiting.  You have a fever.  You have increasing abdominal pain that is not relieved with medicine. Summary  After the procedure, it is common to have a small amount of blood in your stool. You may also have mild abdominal cramping and bloating.  For the first 24 hours after the procedure, do not drive or use machinery, sign  important documents, or drink alcohol.  Contact your health care provider if you have a lot of blood in your stool, nausea or vomiting, a fever, or increased abdominal pain. This information is not intended to replace advice given to you by your health care provider. Make sure you discuss any questions you have with your health care provider. Document Released: 01/28/2004 Document Revised: 04/07/2017 Document Reviewed: 08/27/2015 Elsevier Interactive Patient Education  2019 [facility], Adult, Care After This sheet gives you information about how to care for yourself after your procedure. Your health care provider may also give you more specific instructions. If you have problems or questions, contact your health care provider. What can I expect after the procedure? After the procedure, it is common to have:  A sore throat.  Mild stomach pain or discomfort.  Bloating.  Nausea. Follow these instructions at home:   Follow instructions from your health care provider about what to eat or drink after your procedure.  Take over-the-counter and prescription medicines only as told by your health care provider.  Do not drive for 24 hours if you were given a sedative during your procedure.  Keep all follow-up visits as told by your health care provider. This is important. Contact a health care provider if you have:  A sore throat that lasts longer than one day.  Trouble swallowing. Get help right away if:  You vomit blood or your vomit looks like coffee grounds.  You  have: ? A fever. ? Bloody, black, or tarry stools. ? A severe sore throat or you cannot swallow. ? Difficulty breathing. ? Severe pain in your chest or abdomen. Summary  After the procedure, it is common to have a sore throat, mild stomach discomfort, bloating, and nausea.  Do not drive for 24 hours if you were given a sedative during the procedure.  Follow instructions from your health care  provider about what to eat or drink after your procedure.  Return to your normal activities as told by your health care provider. This information is not intended to replace advice given to you by your health care provider. Make sure you discuss any questions you have with your health care provider. Document Released: 12/15/2011 Document Revised: 11/15/2017 Document Reviewed: 11/15/2017 Elsevier Interactive Patient Education  2019 [employer].

## 2018-07-07 NOTE — H&P (Signed)
Heidi Wallace is an 83 y.o. female.   Chief Complaint: Patient is here for EGD and colonoscopy. HPI: Patient is 83 year old Caucasian female who has chronic GERD who is on PPI and still has chronic cough.  She has been evaluated by ENT specialist as well pulmonologist and no therapy has really made a big difference.  Her heartburn is well controlled with Nexium which she takes once a day.  She has difficulty swallowing pills but not with food.  She is also undergoing screening colonoscopy because of family history.  Her father had surgery for rectal carcinoma at 21.  He then had radiation therapy lived to be 76.  Her first cousin on mother side also had colon cancer and died within 16 months of diagnosis.  She was in her mid 73s at the time of her demise.  Patient has a history of constipation.  She takes fiber supplement daily and uses polyethylene glycol on as-needed basis. Last EGD revealed gastric polyps.   Past Medical History:  Diagnosis Date  . Chest pain   . Chronic kidney disease   . Coronary artery disease    cardiac cath 08/2011: heavily calcified coronary arteries without obstructive disease. 40% proximal LAD, 40% in OMs.   . GERD (gastroesophageal reflux disease)   . Headache   . History of nuclear stress test 2017   low risk study  . Hyperlipidemia   . Hypertension   . Post concussion syndrome   . PVC's (premature ventricular contractions)    PVCs and ventricular bigeminy with abnormal nuclear test    Past Surgical History:  Procedure Laterality Date  . BREAST CYST EXCISION  benign right breast   1982  . CARDIAC CATHETERIZATION  07/2011   [facility]; [facility]  . GALLBLADDER SURGERY  2003  . ROTATOR CUFF REPAIR  right 1989  . TUBAL LIGATION  1974    Family History  Problem Relation Age of Onset  . Heart disease Father   . Heart disease Mother    Social History:  reports that she quit smoking about 46 years ago. Her smoking use included cigarettes. She has a 16.00 pack-year  smoking history. She has never used smokeless tobacco. She reports that she does not drink alcohol or use drugs.  Allergies:  Allergies  Allergen Reactions  . Atorvastatin     myalgia  . Clindamycin/Lincomycin Other (See Comments)    c-diff   . Levaquin [Levofloxacin In D5w] Itching    At IV site including red streaking  . Morphine Other (See Comments)    REACTION: Patient states that her mother coded when given this medication therefore patient refuses to take  . Ceclor [Cefaclor] Rash  . Cephalosporins Rash  . Keflex [Cephalexin] Rash  . Penicillins Hives and Rash    Has patient had a PCN reaction causing immediate rash, facial/tongue/throat swelling, SOB or lightheadedness with hypotension: Yes Has patient had a PCN reaction causing severe rash involving mucus membranes or skin necrosis: No Has patient had a PCN reaction that required hospitalization: No Has patient had a PCN reaction occurring within the last 10 years: No If all of the above answers are "NO", then may proceed with Cephalosporin use.     Medications Prior to Admission  Medication Sig Dispense Refill  . aspirin EC 81 MG tablet Take 81 mg by mouth daily.    . cetirizine (ZYRTEC) 10 MG tablet Take 10 mg by mouth daily as needed for allergies (sinus drainage.).     Marland Kitchen esomeprazole (  NEXIUM) 40 MG capsule Take 40 mg by mouth daily.   3  . ezetimibe (ZETIA) 10 MG tablet Take 10 mg by mouth daily.     . fluticasone (FLONASE) 50 MCG/ACT nasal spray Place 1-2 sprays into both nostrils at bedtime as needed for allergies.    Marland Kitchen gabapentin (NEURONTIN) 300 MG capsule Take 300 mg by mouth every evening. (1900)    . ipratropium (ATROVENT) 0.06 % nasal spray Place 2 sprays into both nostrils 2 (two) times daily as needed (allergies/sinus issues.).     Marland Kitchen isosorbide mononitrate (IMDUR) 60 MG 24 hr tablet TAKE 1 TABLET BY MOUTH DAILY (Patient taking differently: Take 60 mg by mouth daily after lunch. (1300)) 90 tablet 0  . metoprolol  succinate (TOPROL-XL) 50 MG 24 hr tablet Take 50 mg by mouth daily at 6 PM. (1700)    . olmesartan-hydrochlorothiazide (BENICAR HCT) 40-12.5 MG per tablet Take 0.5 tablets by mouth daily.     . rosuvastatin (CRESTOR) 5 MG tablet Take 1 tablet (5 mg total) by mouth daily. 30 tablet 0  . travoprost, benzalkonium, (TRAVATAN) 0.004 % ophthalmic solution Place 1 drop into both eyes at bedtime.     . Wheat Dextrin (BENEFIBER DRINK MIX PO) Take 1 Package by mouth daily.     . budesonide-formoterol (SYMBICORT) 80-4.5 MCG/ACT inhaler Inhale 2 puffs into the lungs every 12 (twelve) hours. (Patient not taking: Reported on 06/30/2018) 1 Inhaler 0    No results found for this or any previous visit (from the past 48 hour(s)). No results found.  ROS  Blood pressure (!) 180/89, pulse 85, temperature 97.9 F (36.6 C), temperature source Oral, resp. rate 17, height 5\' 3"  (1.6 m), weight 52.6 kg, SpO2 99 %. Physical Exam  Constitutional: She appears well-developed and well-nourished.  HENT:  Mouth/Throat: Oropharynx is clear and moist.  Eyes: Conjunctivae are normal. No scleral icterus.  Neck: No thyromegaly present.  Cardiovascular: Normal rate, regular rhythm and normal heart sounds.  No murmur heard. Respiratory: Effort normal and breath sounds normal.  GI: Soft. She exhibits no distension and no mass. There is no abdominal tenderness.  Musculoskeletal:        General: No edema.  Lymphadenopathy:    She has no cervical adenopathy.  Neurological: She is alert.  Skin: Skin is warm and dry.     Assessment/Plan Chronic cough in a patient with GERD. History of gastric polyps. Diagnostic EGD and high risk screening colonoscopy.   Hildred Laser, MD 07/07/2018, 12:45 PM

## 2018-07-07 NOTE — Op Note (Signed)
[facility] Patient Name: Heidi Wallace Procedure Date: 07/07/2018 11:34 AM MRN: [id number] Date of Birth: 09/17/1935 Attending MD: Hildred Laser , MD CSN: [id number] Age: 83 Admit Type: Outpatient Procedure:                Upper GI endoscopy Indications:              Follow-up of gastro-esophageal reflux disease,                            Chronic cough Providers:                Hildred Laser, MD, Janeece Riggers, RN, Aram Candela Referring MD:             Manon Hilding, MD Medicines:                Lidocaine spray, Meperidine 50 mg IV, Midazolam 5                            mg IV Complications:            No immediate complications. Estimated Blood Loss:     Estimated blood loss was minimal. Procedure:                Pre-Anesthesia Assessment:                           - Prior to the procedure, a History and Physical                            was performed, and patient medications and                            allergies were reviewed. The patient's tolerance of                            previous anesthesia was also reviewed. The risks                            and benefits of the procedure and the sedation                            options and risks were discussed with the patient.                            All questions were answered, and informed consent                            was obtained. Prior Anticoagulants: The patient                            last took aspirin 2 days prior to the procedure.                            ASA Grade Assessment: III - A patient with severe  systemic disease. After reviewing the risks and                            benefits, the patient was deemed in satisfactory                            condition to undergo the procedure.                           After obtaining informed consent, the endoscope was                            passed under direct vision. Throughout the                            procedure,  the patient's blood pressure, pulse, and                            oxygen saturations were monitored continuously. The                            GIF-H190 ([id number]) scope was introduced through the                            mouth, and advanced to the second part of duodenum.                            The upper GI endoscopy was accomplished without                            difficulty. The patient tolerated the procedure                            well. Scope In: 1:06:56 PM Scope Out: 1:14:30 PM Total Procedure Duration: 0 hours 7 minutes 34 seconds  Findings:      The examined esophagus was normal.      The Z-line was irregular and was found 38 cm from the incisors.      Multiple 4 to 9 mm pedunculated and sessile polyps with no bleeding and       no stigmata of recent bleeding were found in the gastric fundus and in       the gastric body. Biopsies were taken with a cold forceps for histology.       The pathology specimen was placed into Bottle Number 1.      The exam of the stomach was otherwise normal.      The duodenal bulb and second portion of the duodenum were normal. Impression:               - Normal esophagus.                           - Z-line irregular, 38 cm from the incisors.                           - Multiple gastric polyps. Biopsied.                           -  Normal duodenal bulb and second portion of the                            duodenum. Moderate Sedation:      Moderate (conscious) sedation was administered by the endoscopy nurse       and supervised by the endoscopist. The following parameters were       monitored: oxygen saturation, heart rate, blood pressure, CO2       capnography and response to care. Total physician intraservice time was       14 minutes. Recommendation:           - Patient has a contact number available for                            emergencies. The signs and symptoms of potential                            delayed complications were  discussed with the                            patient. Return to normal activities tomorrow.                            Written discharge instructions were provided to the                            patient.                           - Resume previous diet today.                           - Continue present medications.                           - Use Pepcid (famotidine) 20 mg PO daily.                           - Await pathology results.                           - See the other procedure note for documentation of                            additional recommendations. Procedure Code(s):        --- Professional ---                           [id number], Esophagogastroduodenoscopy, flexible,                            transoral; with biopsy, single or multiple                           G0500, Moderate sedation services provided by the  same physician or other qualified health care                            professional performing a gastrointestinal                            endoscopic service that sedation supports,                            requiring the presence of an independent trained                            observer to assist in the monitoring of the                            patient's level of consciousness and physiological                            status; initial 15 minutes of intra-service time;                            patient age 57 years or older (additional time may                            be reported with [phone], as appropriate) Diagnosis Code(s):        --- Professional ---                           K22.8, Other specified diseases of esophagus                           K31.7, Polyp of stomach and duodenum                           K21.9, Gastro-esophageal reflux disease without                            esophagitis                           R05, Cough CPT copyright 2018 American Medical Association. All rights reserved. The codes  documented in this report are preliminary and upon coder review may  be revised to meet current compliance requirements. Hildred Laser, MD Hildred Laser, MD 07/07/2018 1:52:33 PM This report has been signed electronically. Number of Addenda: 0

## 2018-07-11 ENCOUNTER — Encounter (HOSPITAL_COMMUNITY): Payer: Self-pay | Admitting: Internal Medicine

## 2019-01-04 ENCOUNTER — Ambulatory Visit: Payer: Medicare Other | Admitting: Cardiovascular Disease

## 2019-03-27 ENCOUNTER — Ambulatory Visit: Payer: Medicare Other | Admitting: Cardiovascular Disease

## 2019-03-27 ENCOUNTER — Other Ambulatory Visit: Payer: Self-pay

## 2019-03-27 ENCOUNTER — Encounter: Payer: Self-pay | Admitting: Cardiovascular Disease

## 2019-03-27 VITALS — BP 125/77 | HR 64 | Ht 63.0 in | Wt 130.8 lb

## 2019-03-27 DIAGNOSIS — I25118 Atherosclerotic heart disease of native coronary artery with other forms of angina pectoris: Secondary | ICD-10-CM | POA: Diagnosis not present

## 2019-03-27 DIAGNOSIS — I493 Ventricular premature depolarization: Secondary | ICD-10-CM

## 2019-03-27 DIAGNOSIS — R002 Palpitations: Secondary | ICD-10-CM | POA: Diagnosis not present

## 2019-03-27 DIAGNOSIS — I10 Essential (primary) hypertension: Secondary | ICD-10-CM | POA: Diagnosis not present

## 2019-03-27 DIAGNOSIS — E7800 Pure hypercholesterolemia, unspecified: Secondary | ICD-10-CM | POA: Diagnosis not present

## 2019-03-27 NOTE — Progress Notes (Addendum)
SUBJECTIVE: The patient presents for follow-up of nonobstructive coronary disease and symptomatic PVCs. Cardiac catheterization was performed in 2013 after an abnormal stress test and showed mild to moderate nonobstructive CAD with heavy calcifications. Left ventricular systolic function was normal.   She has a history of severe hyperlipidemia with statin intolerance but has tolerated low-dose Crestor with Zetia.  She has a long history of intermittent atypical chest pain with most recent nuclear stress test in January 2017 showing no evidence of ischemia with normal ejection fraction, overall a low risk study.  She did not respond well to Ranexa.  She is here with her husband, Marcello Moores, who is also my patient.  She brought in an ECG performed on 03/17/2019 which I personally reviewed which demonstrated sinus rhythm with baseline artifact.  He brought in a chest x-ray report dated 03/20/2019 which showed no active cardiopulmonary disease.  She had been having upper right-sided chest pains radiating to her left shoulder blade.  She has had a chronic cough of the past 20 years.  She was recently put on a prednisone taper by her PCP and her symptoms have improved.  She denies exertional chest pain and shortness of breath.    Review of Systems: As per "subjective", otherwise negative.  Allergies  Allergen Reactions  . Atorvastatin     myalgia  . Clindamycin/Lincomycin Other (See Comments)    c-diff   . Levaquin [Levofloxacin In D5w] Itching    At IV site including red streaking  . Morphine Other (See Comments)    REACTION: Patient states that her mother coded when given this medication therefore patient refuses to take  . Ceclor [Cefaclor] Rash  . Cephalosporins Rash  . Keflex [Cephalexin] Rash  . Penicillins Hives and Rash    Has patient had a PCN reaction causing immediate rash, facial/tongue/throat swelling, SOB or lightheadedness with hypotension: Yes Has patient had a  PCN reaction causing severe rash involving mucus membranes or skin necrosis: No Has patient had a PCN reaction that required hospitalization: No Has patient had a PCN reaction occurring within the last 10 years: No If all of the above answers are "NO", then may proceed with Cephalosporin use.     Current Outpatient Medications  Medication Sig Dispense Refill  . aspirin EC 81 MG tablet Take 1 tablet (81 mg total) by mouth daily.    . cetirizine (ZYRTEC) 10 MG tablet Take 10 mg by mouth daily as needed for allergies (sinus drainage.).     Marland Kitchen esomeprazole (NEXIUM) 40 MG capsule Take 40 mg by mouth daily.   3  . ezetimibe (ZETIA) 10 MG tablet Take 10 mg by mouth daily.     . fluticasone (FLONASE) 50 MCG/ACT nasal spray Place 1-2 sprays into both nostrils at bedtime as needed for allergies.    Marland Kitchen gabapentin (NEURONTIN) 300 MG capsule Take 300 mg by mouth every evening. (1900)    . isosorbide mononitrate (IMDUR) 60 MG 24 hr tablet TAKE 1 TABLET BY MOUTH DAILY (Patient taking differently: Take 60 mg by mouth daily after lunch. (1300)) 90 tablet 0  . metoprolol succinate (TOPROL-XL) 50 MG 24 hr tablet Take 50 mg by mouth daily at 6 PM. (1700)    . olmesartan-hydrochlorothiazide (BENICAR HCT) 40-12.5 MG per tablet Take 0.5 tablets by mouth daily.     . predniSONE (DELTASONE) 20 MG tablet Take 20 mg by mouth daily with breakfast.    . rosuvastatin (CRESTOR) 5 MG tablet Take 1 tablet (5  mg total) by mouth daily. 30 tablet 0  . travoprost, benzalkonium, (TRAVATAN) 0.004 % ophthalmic solution Place 1 drop into both eyes at bedtime.     . Wheat Dextrin (BENEFIBER DRINK MIX PO) Take 1 Package by mouth daily.      No current facility-administered medications for this visit.     Past Medical History:  Diagnosis Date  . Chest pain   . Chronic kidney disease   . Coronary artery disease    cardiac cath 08/2011: heavily calcified coronary arteries without obstructive disease. 40% proximal LAD, 40% in OMs.    . GERD (gastroesophageal reflux disease)   . Headache   . History of nuclear stress test 2017   low risk study  . Hyperlipidemia   . Hypertension   . Post concussion syndrome   . PVC's (premature ventricular contractions)    PVCs and ventricular bigeminy with abnormal nuclear test    Past Surgical History:  Procedure Laterality Date  . BIOPSY  07/07/2018   Procedure: BIOPSY;  Surgeon: Rogene Houston, MD;  Location: [facility];  Service: Endoscopy;;  gastric polyp  . BREAST CYST EXCISION  benign right breast   1982  . CARDIAC CATHETERIZATION  07/2011   [facility]; [facility]  . COLONOSCOPY N/A 07/07/2018   Procedure: COLONOSCOPY;  Surgeon: Rogene Houston, MD;  Location: [facility];  Service: Endoscopy;  Laterality: N/A;  12:25  . ESOPHAGOGASTRODUODENOSCOPY N/A 07/07/2018   Procedure: ESOPHAGOGASTRODUODENOSCOPY (EGD);  Surgeon: Rogene Houston, MD;  Location: [facility];  Service: Endoscopy;  Laterality: N/A;  . GALLBLADDER SURGERY  2003  . POLYPECTOMY  07/07/2018   Procedure: POLYPECTOMY;  Surgeon: Rogene Houston, MD;  Location: [facility];  Service: Endoscopy;;  colon   . ROTATOR CUFF REPAIR  right 1989  . TUBAL LIGATION  1974    Social History   Socioeconomic History  . Marital status: Married    Spouse name: Not on file  . Number of children: Not on file  . Years of education: Not on file  . Highest education level: Not on file  Occupational History  . Occupation: former Animal nutritionist  . Financial resource strain: Not on file  . Food insecurity    Worry: Not on file    Inability: Not on file  . Transportation needs    Medical: Not on file    Non-medical: Not on file  Tobacco Use  . Smoking status: Former Smoker    Packs/day: 1.00    Years: 16.00    Pack years: 16.00    Types: Cigarettes    Quit date: 06/29/1972    Years since quitting: 46.7  . Smokeless tobacco: Never Used  Substance and Sexual Activity  . Alcohol use: No  . Drug use: No  . Sexual  activity: Not on file  Lifestyle  . Physical activity    Days per week: Not on file    Minutes per session: Not on file  . Stress: Not on file  Relationships  . Social Herbalist on phone: Not on file    Gets together: Not on file    Attends religious service: Not on file    Active member of club or organization: Not on file    Attends meetings of clubs or organizations: Not on file    Relationship status: Not on file  . Intimate partner violence    Fear of current or ex partner: Not on file  Emotionally abused: Not on file    Physically abused: Not on file    Forced sexual activity: Not on file  Other Topics Concern  . Not on file  Social History Narrative  . Not on file     Vitals:   03/27/19 1346  BP: 125/77  Pulse: 64  SpO2: 98%  Weight: 130 lb 12.8 oz (59.3 kg)  Height: 5\' 3"  (1.6 m)    Wt Readings from Last 3 Encounters:  03/27/19 130 lb 12.8 oz (59.3 kg)  07/07/18 116 lb (52.6 kg)  06/07/18 122 lb (55.3 kg)     PHYSICAL EXAM General: NAD HEENT: Normal. Neck: No JVD, no thyromegaly. Lungs: Clear to auscultation bilaterally with normal respiratory effort. CV: Regular rate and rhythm, normal S1/S2, no S3/S4, no murmur. No pretibial or periankle edema.  No carotid bruit.   Abdomen: Soft, nontender, no distention.  Neurologic: Alert and oriented.  Psych: Normal affect. Skin: Normal. Musculoskeletal: No gross deformities.      Labs: Lab Results  Component Value Date/Time   K 3.6 02/20/2017 06:15 PM   BUN 13 02/20/2017 06:15 PM   CREATININE 0.89 02/20/2017 06:15 PM   ALT 16 02/20/2017 06:15 PM   HGB 13.1 02/20/2017 06:15 PM     Lipids: No results found for: LDLCALC, LDLDIRECT, CHOL, TRIG, HDL     ASSESSMENT AND PLAN:  1. Coronary artery disease (nonobstructive): Symptomatically stable on aspirin, Imdur 60 mg, Toprol-XL 25 mg, and Crestor 5 mg.  No changes to therapy.  2. Hyperlipidemia: Continue Crestor 5 mg and Zetia.  3.  Symptomatic PVCs: Symptomatically stable on Toprol-XL 25 mg daily. No changes.  4. Essential hypertension: Controlled on present therapy. No changes.  5.  Thoracic aortic aneurysm: She had a 4.3 thoracic aortic aneurysm by CT in 2018.  I will obtain a follow-up CT.   Disposition: Follow up 1 yr   Kate Sable, M.D., F.A.C.C.

## 2019-03-27 NOTE — Patient Instructions (Signed)

## 2019-04-13 ENCOUNTER — Telehealth: Payer: Self-pay | Admitting: *Deleted

## 2019-04-13 DIAGNOSIS — I7121 Aneurysm of the ascending aorta, without rupture: Secondary | ICD-10-CM

## 2019-04-13 DIAGNOSIS — I712 Thoracic aortic aneurysm, without rupture: Secondary | ICD-10-CM

## 2019-04-13 NOTE — Telephone Encounter (Signed)
RE: CT Herminio Commons, MD sent to Laurine Blazer, LPN  Yes please. Thanks for looking into this.  Previous Messages   ----- Message -----  From: Laurine Blazer, LPN  Sent: [id number]  5:02 PM EDT  To: Herminio Commons, MD  Subject: CT                        Recall in from 02/08/2018 for -   CT Chest for ascending thoracic aortic aneurysm - repeat 1 year per SK --agh   Does she need this ??   Thanks,   Edd Fabian .

## 2019-04-13 NOTE — Telephone Encounter (Signed)
Patient notified & agrees to repeat CT.  States she will be having yearly labs done with pmd on 04/27/19 and would like to schedule for 2nd week of November if possible.

## 2019-04-19 ENCOUNTER — Telehealth: Payer: Self-pay | Admitting: Cardiovascular Disease

## 2019-04-19 NOTE — Telephone Encounter (Signed)
Pre-cert Verification for the following procedure    CT ANGIO CHEST scheduled for 05/10/2019 at [facility] .

## 2019-04-19 NOTE — Telephone Encounter (Signed)
Patient is scheduled for CT ANGIO CHEST on 05/10/2019. She has questions about the procedure.

## 2019-04-19 NOTE — Telephone Encounter (Signed)
Patient contacted and she wanted to know if this test was invasive. Advised that it was not and it would be similar to the ct scans she's had in the past. Verbalized understanding.

## 2019-05-05 ENCOUNTER — Telehealth: Payer: Self-pay | Admitting: Cardiovascular Disease

## 2019-05-05 NOTE — Telephone Encounter (Signed)
Creatinine 1.03 on 04/27/2019.  Patient was concerned about the kidney function & if safe for her to have the CT to monitor aneurysm.    Been on Imdur 3-4 years now, was seen by Dr. Fletcher Anon at that time.  She questions if she really should be on the Imdur if she has the aneurysm - wants to make sure this will not be harmful in the long run.

## 2019-05-05 NOTE — Telephone Encounter (Signed)
Would like to know about if she should have test due to dye could mess up his levels

## 2019-05-08 NOTE — Telephone Encounter (Signed)
Patient notified and verbalized understanding. 

## 2019-05-08 NOTE — Telephone Encounter (Signed)
Kidney function is well within the range to tolerate the CT scan, I would proceed with the study. Imdur will not affect the aneurysm. It essentially is a long acting type of nitroglycerin used to help prevent chest pain.   Zandra Abts MD

## 2019-05-10 ENCOUNTER — Ambulatory Visit (HOSPITAL_COMMUNITY)
Admission: RE | Admit: 2019-05-10 | Discharge: 2019-05-10 | Disposition: A | Discharge status: Home / Self Care | Payer: Medicare Other | Source: Ambulatory Visit | Attending: Cardiovascular Disease | Admitting: Cardiovascular Disease

## 2019-05-10 ENCOUNTER — Other Ambulatory Visit: Payer: Self-pay

## 2019-05-10 DIAGNOSIS — I712 Thoracic aortic aneurysm, without rupture: Secondary | ICD-10-CM | POA: Insufficient documentation

## 2019-05-10 DIAGNOSIS — I7121 Aneurysm of the ascending aorta, without rupture: Secondary | ICD-10-CM

## 2019-05-10 MED ORDER — IOHEXOL 350 MG/ML SOLN
100.0000 mL | Freq: Once | INTRAVENOUS | Status: AC | PRN
  Administered 2019-05-10: 100 mL via INTRAVENOUS

## 2019-05-16 ENCOUNTER — Telehealth: Payer: Self-pay | Admitting: *Deleted

## 2019-05-16 NOTE — Telephone Encounter (Signed)
Notes recorded by Laurine Blazer, LPN on [id number] at 5:32 PM EST  Patient notified. Copy to pmd.  ------   Notes recorded by Herminio Commons, MD on 05/10/2019 at 4:20 PM EST  Aneurysm is stable at 4.1 cm. Please repeat study in 1 year.

## 2019-06-30 IMAGING — DX DG CHEST 2V
2 series · 2 of 2 positions shown · non-contrast
Comparison: 10/21/2016

CLINICAL DATA: Chest pain, weakness

EXAM:
CHEST  2 VIEW

[chest lat]
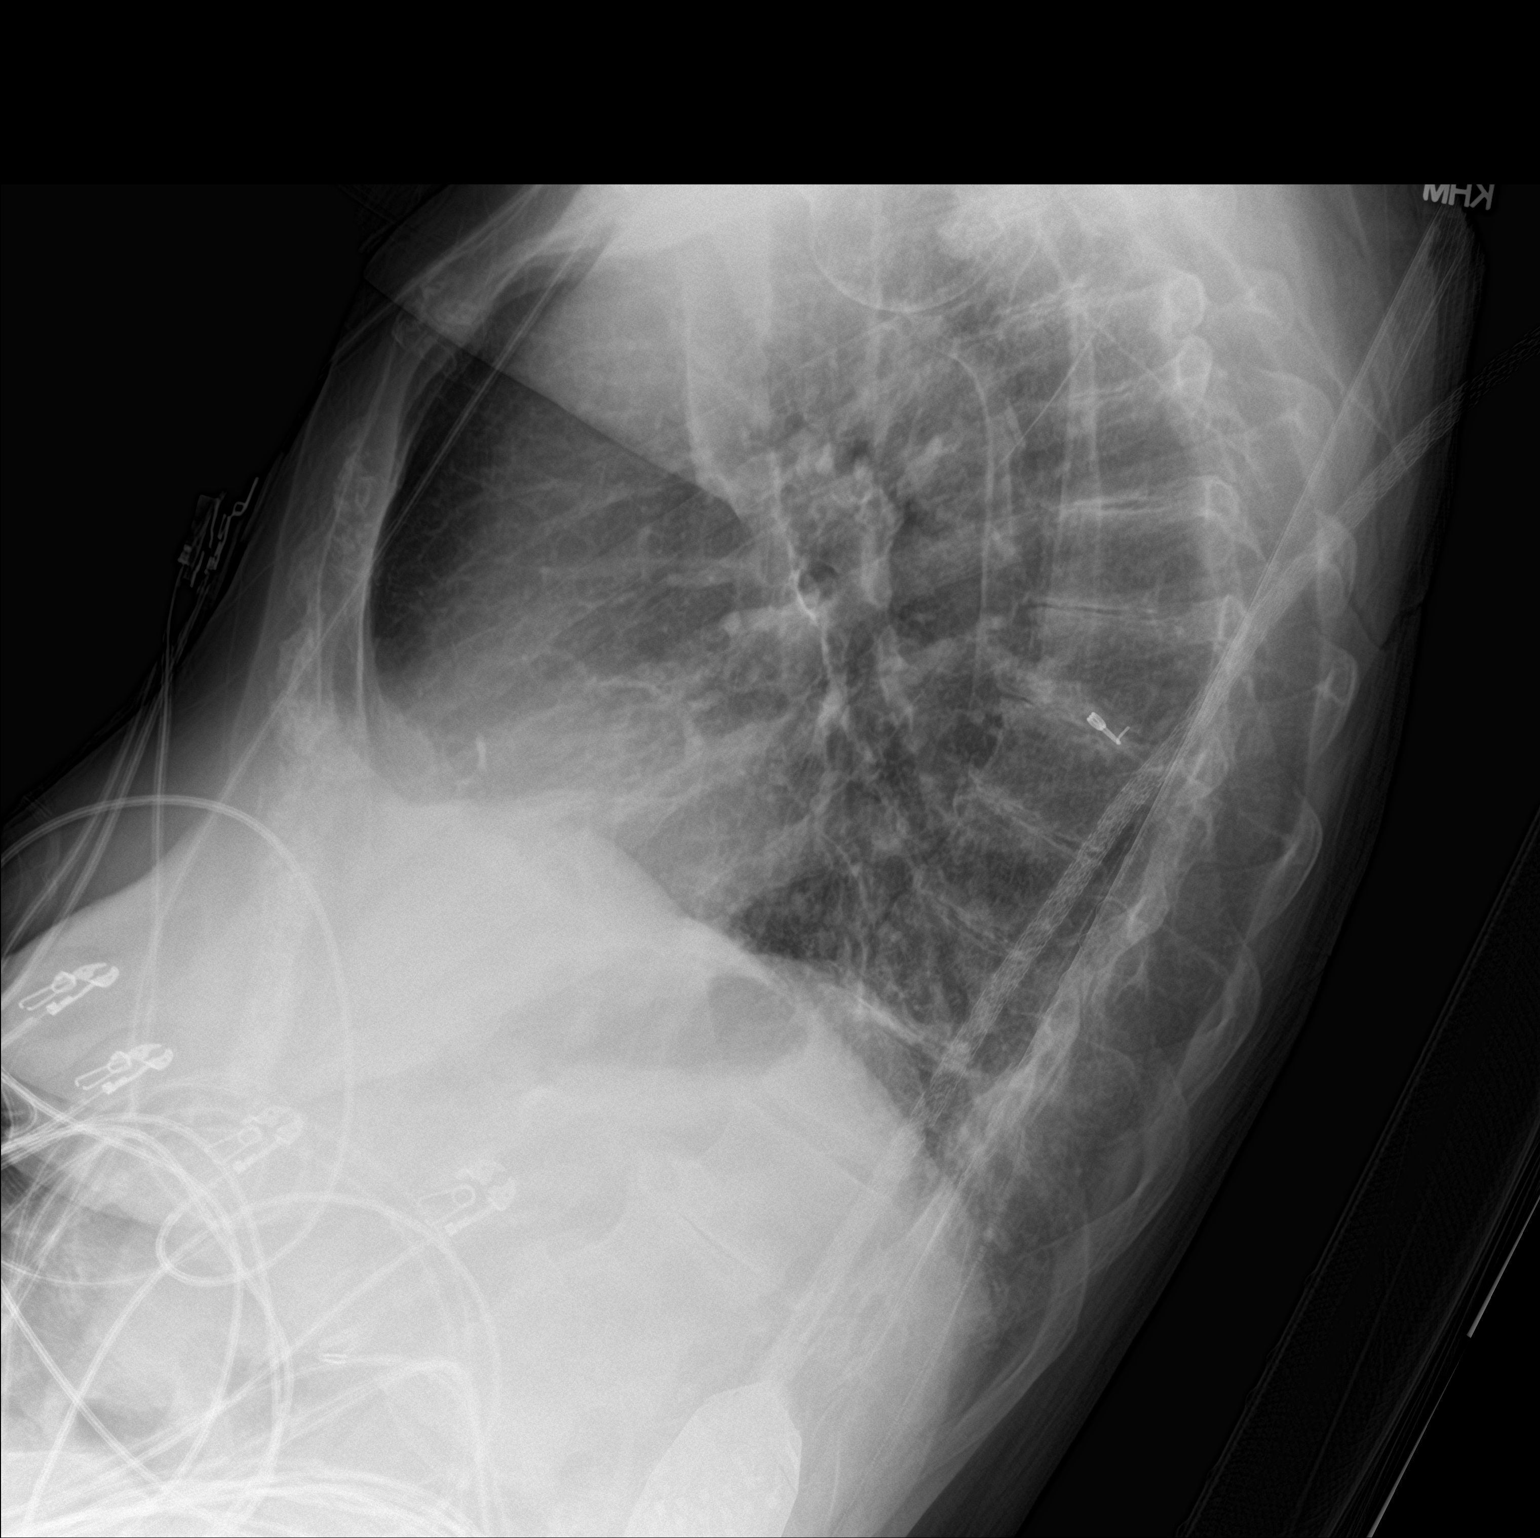

[chest ap]
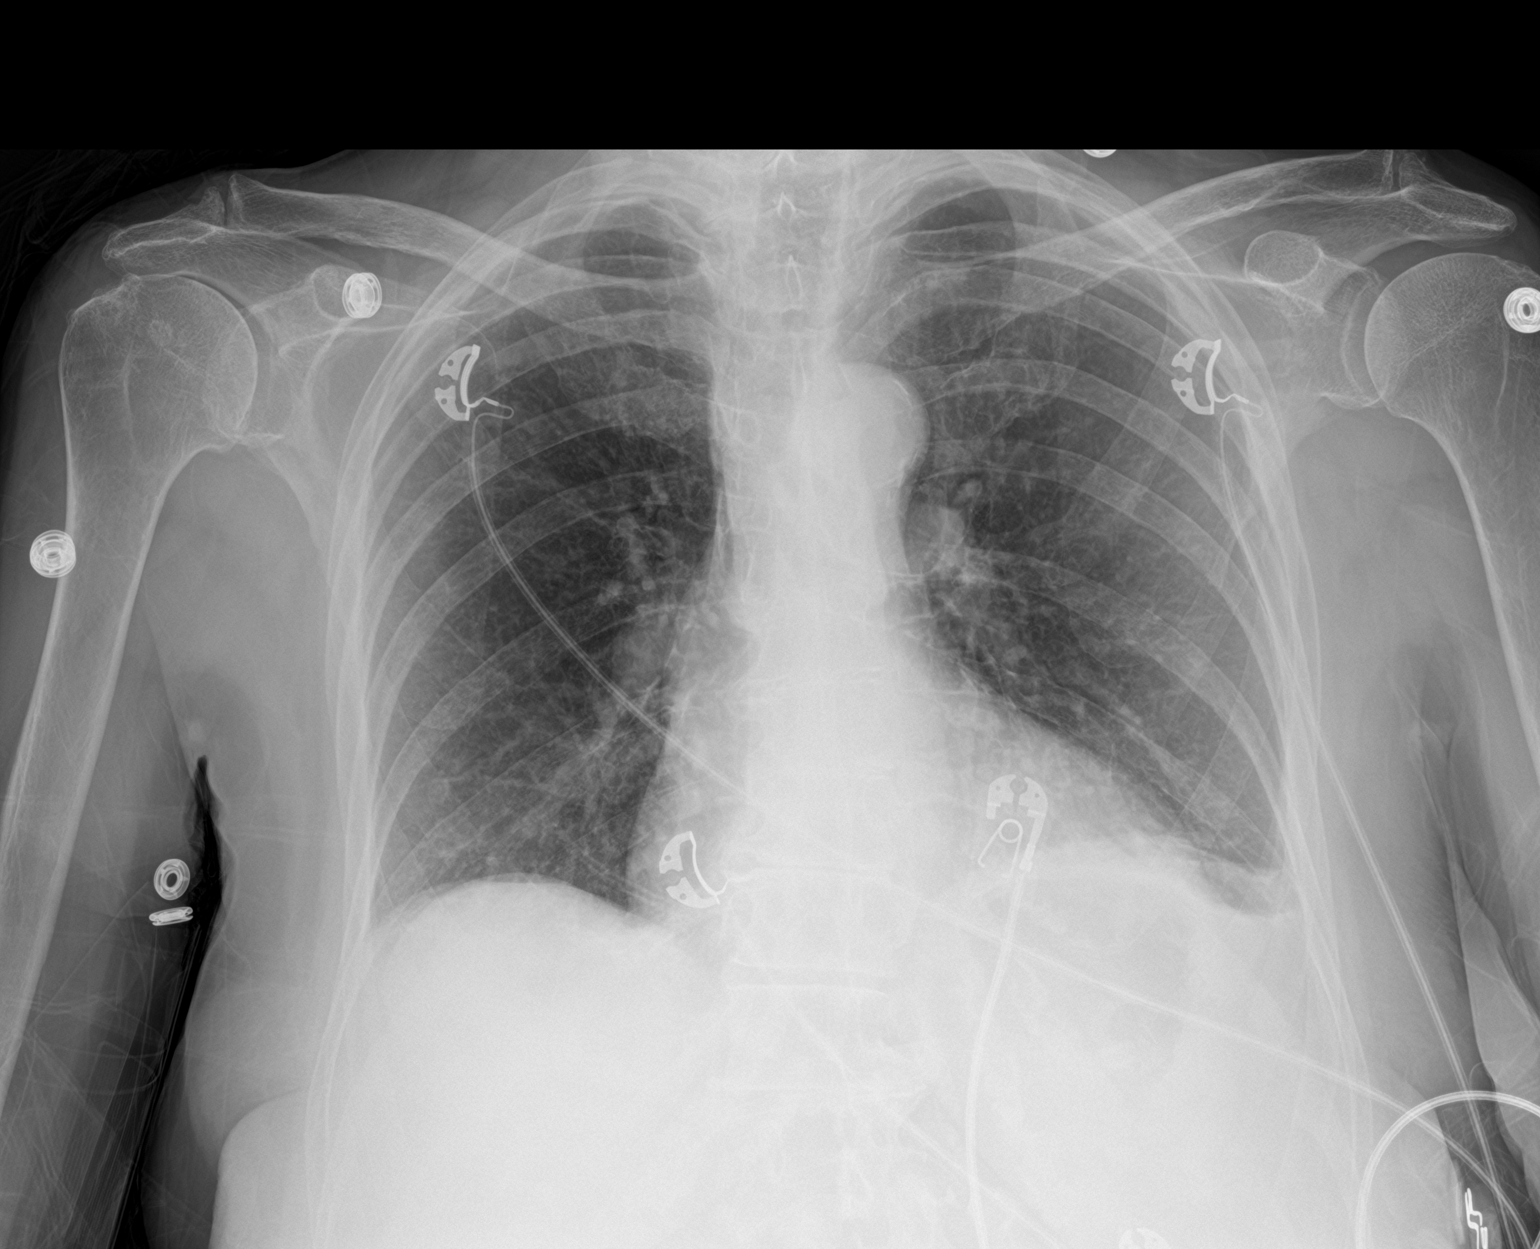

[2 of 2 positions shown; findings below may reference images not displayed]

FINDINGS: Heart is borderline in size. Minimal left base atelectasis. Right
lung is clear. No effusions. No acute bony abnormality.
IMPRESSION: Left base atelectasis.

## 2019-08-30 DIAGNOSIS — Z20828 Contact with and (suspected) exposure to other viral communicable diseases: Secondary | ICD-10-CM | POA: Diagnosis not present

## 2019-08-30 DIAGNOSIS — J069 Acute upper respiratory infection, unspecified: Secondary | ICD-10-CM | POA: Diagnosis not present

## 2019-08-30 DIAGNOSIS — J0101 Acute recurrent maxillary sinusitis: Secondary | ICD-10-CM | POA: Diagnosis not present

## 2019-09-12 DIAGNOSIS — L0100 Impetigo, unspecified: Secondary | ICD-10-CM | POA: Diagnosis not present

## 2019-09-12 DIAGNOSIS — L821 Other seborrheic keratosis: Secondary | ICD-10-CM | POA: Diagnosis not present

## 2019-09-12 DIAGNOSIS — L570 Actinic keratosis: Secondary | ICD-10-CM | POA: Diagnosis not present

## 2019-09-26 DIAGNOSIS — Z6823 Body mass index (BMI) 23.0-23.9, adult: Secondary | ICD-10-CM | POA: Diagnosis not present

## 2019-09-26 DIAGNOSIS — R110 Nausea: Secondary | ICD-10-CM | POA: Diagnosis not present

## 2019-09-26 DIAGNOSIS — R5383 Other fatigue: Secondary | ICD-10-CM | POA: Diagnosis not present

## 2019-09-26 DIAGNOSIS — M545 Low back pain: Secondary | ICD-10-CM | POA: Diagnosis not present

## 2019-09-26 DIAGNOSIS — R630 Anorexia: Secondary | ICD-10-CM | POA: Diagnosis not present

## 2019-09-26 DIAGNOSIS — R079 Chest pain, unspecified: Secondary | ICD-10-CM | POA: Diagnosis not present

## 2019-09-26 DIAGNOSIS — M2550 Pain in unspecified joint: Secondary | ICD-10-CM | POA: Diagnosis not present

## 2019-09-26 DIAGNOSIS — E559 Vitamin D deficiency, unspecified: Secondary | ICD-10-CM | POA: Diagnosis not present

## 2019-11-03 DIAGNOSIS — Z6822 Body mass index (BMI) 22.0-22.9, adult: Secondary | ICD-10-CM | POA: Diagnosis not present

## 2019-11-03 DIAGNOSIS — R0789 Other chest pain: Secondary | ICD-10-CM | POA: Diagnosis not present

## 2019-11-15 DIAGNOSIS — H401134 Primary open-angle glaucoma, bilateral, indeterminate stage: Secondary | ICD-10-CM | POA: Diagnosis not present

## 2019-12-22 DIAGNOSIS — K2100 Gastro-esophageal reflux disease with esophagitis, without bleeding: Secondary | ICD-10-CM | POA: Diagnosis not present

## 2019-12-22 DIAGNOSIS — E7800 Pure hypercholesterolemia, unspecified: Secondary | ICD-10-CM | POA: Diagnosis not present

## 2019-12-22 DIAGNOSIS — R7301 Impaired fasting glucose: Secondary | ICD-10-CM | POA: Diagnosis not present

## 2019-12-22 DIAGNOSIS — N183 Chronic kidney disease, stage 3 unspecified: Secondary | ICD-10-CM | POA: Diagnosis not present

## 2019-12-22 DIAGNOSIS — E559 Vitamin D deficiency, unspecified: Secondary | ICD-10-CM | POA: Diagnosis not present

## 2019-12-22 DIAGNOSIS — I10 Essential (primary) hypertension: Secondary | ICD-10-CM | POA: Diagnosis not present

## 2019-12-22 DIAGNOSIS — R945 Abnormal results of liver function studies: Secondary | ICD-10-CM | POA: Diagnosis not present

## 2019-12-27 DIAGNOSIS — Z1331 Encounter for screening for depression: Secondary | ICD-10-CM | POA: Diagnosis not present

## 2019-12-27 DIAGNOSIS — E559 Vitamin D deficiency, unspecified: Secondary | ICD-10-CM | POA: Diagnosis not present

## 2019-12-27 DIAGNOSIS — Z1389 Encounter for screening for other disorder: Secondary | ICD-10-CM | POA: Diagnosis not present

## 2019-12-27 DIAGNOSIS — I10 Essential (primary) hypertension: Secondary | ICD-10-CM | POA: Diagnosis not present

## 2019-12-27 DIAGNOSIS — R7301 Impaired fasting glucose: Secondary | ICD-10-CM | POA: Diagnosis not present

## 2019-12-27 DIAGNOSIS — R945 Abnormal results of liver function studies: Secondary | ICD-10-CM | POA: Diagnosis not present

## 2020-01-04 DIAGNOSIS — M720 Palmar fascial fibromatosis [Dupuytren]: Secondary | ICD-10-CM | POA: Diagnosis not present

## 2020-01-12 DIAGNOSIS — Z961 Presence of intraocular lens: Secondary | ICD-10-CM | POA: Diagnosis not present

## 2020-01-12 DIAGNOSIS — H18593 Other hereditary corneal dystrophies, bilateral: Secondary | ICD-10-CM | POA: Diagnosis not present

## 2020-01-12 DIAGNOSIS — H401134 Primary open-angle glaucoma, bilateral, indeterminate stage: Secondary | ICD-10-CM | POA: Diagnosis not present

## 2020-01-12 DIAGNOSIS — Z9849 Cataract extraction status, unspecified eye: Secondary | ICD-10-CM | POA: Diagnosis not present

## 2020-01-12 DIAGNOSIS — H16223 Keratoconjunctivitis sicca, not specified as Sjogren's, bilateral: Secondary | ICD-10-CM | POA: Diagnosis not present

## 2020-01-12 DIAGNOSIS — H5700 Unspecified anomaly of pupillary function: Secondary | ICD-10-CM | POA: Diagnosis not present

## 2020-01-12 DIAGNOSIS — H04123 Dry eye syndrome of bilateral lacrimal glands: Secondary | ICD-10-CM | POA: Diagnosis not present

## 2020-01-12 DIAGNOSIS — H5202 Hypermetropia, left eye: Secondary | ICD-10-CM | POA: Diagnosis not present

## 2020-01-12 DIAGNOSIS — H18413 Arcus senilis, bilateral: Secondary | ICD-10-CM | POA: Diagnosis not present

## 2020-01-22 DIAGNOSIS — K56609 Unspecified intestinal obstruction, unspecified as to partial versus complete obstruction: Secondary | ICD-10-CM | POA: Diagnosis not present

## 2020-01-22 DIAGNOSIS — I10 Essential (primary) hypertension: Secondary | ICD-10-CM | POA: Diagnosis not present

## 2020-01-22 DIAGNOSIS — Z6823 Body mass index (BMI) 23.0-23.9, adult: Secondary | ICD-10-CM | POA: Diagnosis not present

## 2020-01-24 ENCOUNTER — Other Ambulatory Visit: Payer: Self-pay

## 2020-01-24 DIAGNOSIS — M720 Palmar fascial fibromatosis [Dupuytren]: Secondary | ICD-10-CM | POA: Diagnosis not present

## 2020-01-29 DIAGNOSIS — M25531 Pain in right wrist: Secondary | ICD-10-CM | POA: Diagnosis not present

## 2020-01-29 DIAGNOSIS — M25641 Stiffness of right hand, not elsewhere classified: Secondary | ICD-10-CM | POA: Diagnosis not present

## 2020-01-29 DIAGNOSIS — M720 Palmar fascial fibromatosis [Dupuytren]: Secondary | ICD-10-CM | POA: Diagnosis not present

## 2020-02-06 DIAGNOSIS — M25511 Pain in right shoulder: Secondary | ICD-10-CM | POA: Diagnosis not present

## 2020-02-06 DIAGNOSIS — R079 Chest pain, unspecified: Secondary | ICD-10-CM | POA: Diagnosis not present

## 2020-02-06 DIAGNOSIS — Z6822 Body mass index (BMI) 22.0-22.9, adult: Secondary | ICD-10-CM | POA: Diagnosis not present

## 2020-02-06 DIAGNOSIS — R002 Palpitations: Secondary | ICD-10-CM | POA: Diagnosis not present

## 2020-02-06 DIAGNOSIS — I10 Essential (primary) hypertension: Secondary | ICD-10-CM | POA: Diagnosis not present

## 2020-02-27 DIAGNOSIS — S060X9A Concussion with loss of consciousness of unspecified duration, initial encounter: Secondary | ICD-10-CM | POA: Diagnosis not present

## 2020-02-27 DIAGNOSIS — Z6823 Body mass index (BMI) 23.0-23.9, adult: Secondary | ICD-10-CM | POA: Diagnosis not present

## 2020-02-27 DIAGNOSIS — R519 Headache, unspecified: Secondary | ICD-10-CM | POA: Diagnosis not present

## 2020-02-29 DIAGNOSIS — H5712 Ocular pain, left eye: Secondary | ICD-10-CM | POA: Diagnosis not present

## 2020-02-29 DIAGNOSIS — H04123 Dry eye syndrome of bilateral lacrimal glands: Secondary | ICD-10-CM | POA: Diagnosis not present

## 2020-02-29 DIAGNOSIS — H5700 Unspecified anomaly of pupillary function: Secondary | ICD-10-CM | POA: Diagnosis not present

## 2020-02-29 DIAGNOSIS — H401134 Primary open-angle glaucoma, bilateral, indeterminate stage: Secondary | ICD-10-CM | POA: Diagnosis not present

## 2020-02-29 DIAGNOSIS — Z9849 Cataract extraction status, unspecified eye: Secondary | ICD-10-CM | POA: Diagnosis not present

## 2020-02-29 DIAGNOSIS — Z961 Presence of intraocular lens: Secondary | ICD-10-CM | POA: Diagnosis not present

## 2020-02-29 DIAGNOSIS — H16142 Punctate keratitis, left eye: Secondary | ICD-10-CM | POA: Diagnosis not present

## 2020-03-18 DIAGNOSIS — H401131 Primary open-angle glaucoma, bilateral, mild stage: Secondary | ICD-10-CM | POA: Diagnosis not present

## 2020-03-19 ENCOUNTER — Other Ambulatory Visit: Payer: Self-pay

## 2020-03-19 ENCOUNTER — Encounter: Payer: Self-pay | Admitting: Cardiology

## 2020-03-19 ENCOUNTER — Ambulatory Visit: Payer: Medicare PPO | Admitting: Cardiology

## 2020-03-19 VITALS — BP 130/70 | HR 74 | Ht 63.0 in | Wt 130.0 lb

## 2020-03-19 DIAGNOSIS — I10 Essential (primary) hypertension: Secondary | ICD-10-CM

## 2020-03-19 DIAGNOSIS — I25118 Atherosclerotic heart disease of native coronary artery with other forms of angina pectoris: Secondary | ICD-10-CM | POA: Diagnosis not present

## 2020-03-19 NOTE — Patient Instructions (Signed)
Medication Instructions:  Please discontinue Isosorbide.  Continue all other medications as listed.  *If you need a refill on your cardiac medications before your next appointment, please call your pharmacy*  Follow-Up: At [facility], you and your health needs are our priority.  As part of our continuing mission to provide you with exceptional heart care, we have created designated Provider Care Teams.  These Care Teams include your primary Cardiologist (physician) and Advanced Practice Providers (APPs -  Physician Assistants and Nurse Practitioners) who all work together to provide you with the care you need, when you need it.  We recommend signing up for the patient portal called "MyChart".  Sign up information is provided on this After Visit Summary.  MyChart is used to connect with patients for Virtual Visits (Telemedicine).  Patients are able to view lab/test results, encounter notes, upcoming appointments, etc.  Non-urgent messages can be sent to your provider as well.   To learn more about what you can do with MyChart, go to [email or web address].    Your next appointment:   6 month(s)  The format for your next appointment:   In Person  Provider:   Candee Furbish, MD   Thank you for choosing [facility]!!

## 2020-03-19 NOTE — Progress Notes (Signed)
Cardiology Office Note:    Date:  03/19/2020   ID:  Heidi Wallace, DOB [birth year], MRN [id number]  PCP:  Manon Hilding, MD  [facility] Cardiologist:  Kate Sable, MD (Inactive)  [facility] Electrophysiologist:  None   Referring MD: Manon Hilding, MD     History of Present Illness:    Heidi Wallace is a 84 y.o. female here to establish care, with nonobstructive coronary artery disease, symptomatic PVCs/PACs with Cardiovascular: Stable 4.1 cm dilation of the ascending thoracic aorta. Signs of aortic atherosclerosis both calcified and Noncalcified.  Occasionally she will wake up at night with some epigastric discomfort, nausea.  Usually passes after she is able to belch or potentially drink ginger ale.  This happened to her recently, after a large spaghetti dinner.  She also showed me a catalog of her blood pressures.  Sometimes her blood pressures will get quite low, even in the 54M systolic and she will feel fatigued washed out.  She notes that she takes the isosorbide approximately 3 hours prior to this happening.  I decided to discontinue the isosorbide 60.  Past Medical History:  Diagnosis Date   Chest pain    Chronic kidney disease    Coronary artery disease    cardiac cath 08/2011: heavily calcified coronary arteries without obstructive disease. 40% proximal LAD, 40% in OMs.    GERD (gastroesophageal reflux disease)    Headache    History of nuclear stress test 2017   low risk study   Hyperlipidemia    Hypertension    Post concussion syndrome    PVC's (premature ventricular contractions)    PVCs and ventricular bigeminy with abnormal nuclear test    Past Surgical History:  Procedure Laterality Date   BIOPSY  07/07/2018   Procedure: BIOPSY;  Surgeon: Rogene Houston, MD;  [facility];  Service: Endoscopy;;  gastric polyp   BREAST CYST EXCISION  benign right breast   1982   CARDIAC CATHETERIZATION  07/2011   [place]; [place]    COLONOSCOPY N/A 07/07/2018   Procedure: COLONOSCOPY;  Surgeon: Rogene Houston, MD;  [facility];  Service: Endoscopy;  Laterality: N/A;  12:25   ESOPHAGOGASTRODUODENOSCOPY N/A 07/07/2018   Procedure: ESOPHAGOGASTRODUODENOSCOPY (EGD);  Surgeon: Rogene Houston, MD;  [facility];  Service: Endoscopy;  Laterality: N/A;   GALLBLADDER SURGERY  2003   POLYPECTOMY  07/07/2018   Procedure: POLYPECTOMY;  Surgeon: Rogene Houston, MD;  [facility];  Service: Endoscopy;;  colon    ROTATOR CUFF REPAIR  right 1989   TUBAL LIGATION  1974    Current Medications: Current Meds  Medication Sig   aspirin EC 81 MG tablet Take 1 tablet (81 mg total) by mouth daily.   cetirizine (ZYRTEC) 10 MG tablet Take 10 mg by mouth daily as needed for allergies (sinus drainage.).    Cholecalciferol (VITAMIN D3) 50 MCG (2000 UT) capsule Take 2,000 Units by mouth daily.   esomeprazole (NEXIUM) 40 MG capsule Take 40 mg by mouth daily.    ezetimibe (ZETIA) 10 MG tablet Take 10 mg by mouth daily.    gabapentin (NEURONTIN) 300 MG capsule Take 300 mg by mouth every evening. (1900)   metoprolol succinate (TOPROL-XL) 50 MG 24 hr tablet Take 50 mg by mouth daily at 6 PM. (1700)   olmesartan-hydrochlorothiazide (BENICAR HCT) 40-12.5 MG per tablet Take 0.5 tablets by mouth daily.    rosuvastatin (CRESTOR) 5 MG tablet Take 1  tablet (5 mg total) by mouth daily.   travoprost, benzalkonium, (TRAVATAN) 0.004 % ophthalmic solution Place 1 drop into both eyes at bedtime.    Wheat Dextrin (BENEFIBER DRINK MIX PO) Take 1 Package by mouth daily.    [DISCONTINUED] isosorbide mononitrate (IMDUR) 60 MG 24 hr tablet TAKE 1 TABLET BY MOUTH DAILY     Allergies:   Atorvastatin, Clindamycin/lincomycin, Levaquin [levofloxacin in d5w], Morphine, Ceclor [cefaclor], Cephalosporins, Keflex [cephalexin], and Penicillins   Social History   Socioeconomic History   Marital status: Married    Spouse  name: Not on file   Number of children: Not on file   Years of education: Not on file   Highest education level: Not on file  Occupational History   Occupation: former Therapist, sports  Tobacco Use   Smoking status: Former Smoker    Packs/day: 1.00    Years: 16.00    Pack years: 16.00    Types: Cigarettes    Quit date: 06/29/1972    Years since quitting: 47.7   Smokeless tobacco: Never Used  Vaping Use   Vaping Use: Never used  Substance and Sexual Activity   Alcohol use: No   Drug use: No   Sexual activity: Not on file  Other Topics Concern   Not on file  Social History Narrative   Not on file   Social Determinants of Health   Financial Resource Strain:    Difficulty of Paying Living Expenses: Not on file  Food Insecurity:    Worried About Charity fundraiser in the Last Year: Not on file   [employer] of Food in the Last Year: Not on file  Transportation Needs:    Lack of Transportation (Medical): Not on file   Lack of Transportation (Non-Medical): Not on file  Physical Activity:    Days of Exercise per Week: Not on file   Minutes of Exercise per Session: Not on file  Stress:    Feeling of Stress : Not on file  Social Connections:    Frequency of Communication with Friends and Family: Not on file   Frequency of Social Gatherings with Friends and Family: Not on file   Attends Religious Services: Not on file   Active Member of Clubs or Organizations: Not on file   Attends Archivist Meetings: Not on file   Marital Status: Not on file     Family History: The patient's family history includes Heart disease in her father and mother.  ROS:   Please see the history of present illness.    No fevers chills nausea vomiting syncope bleeding all other systems reviewed and are negative.  EKGs/Labs/Other Studies Reviewed:    The following studies were reviewed today: CT scan of aorta reviewed.  EKG:  EKG is  ordered today.  The ekg ordered today  demonstrates sinus rhythm 74 with PACs.  Recent Labs: No results found for requested labs within last 8760 hours.  Recent Lipid Panel No results found for: CHOL, TRIG, HDL, CHOLHDL, VLDL, LDLCALC, LDLDIRECT  Physical Exam:    VS:  BP 130/70    Pulse 74    Ht 5\' 3"  (1.6 m)    Wt 130 lb (59 kg)    SpO2 98%    BMI 23.03 kg/m     Wt Readings from Last 3 Encounters:  03/19/20 130 lb (59 kg)  03/27/19 130 lb 12.8 oz (59.3 kg)  07/07/18 116 lb (52.6 kg)     GEN:  Well nourished, well  developed in no acute distress HEENT: Normal NECK: No JVD; No carotid bruits LYMPHATICS: No lymphadenopathy CARDIAC: RRR, no murmurs, rubs, gallops, rare ectopy RESPIRATORY:  Clear to auscultation without rales, wheezing or rhonchi  ABDOMEN: Soft, non-tender, non-distended MUSCULOSKELETAL:  No edema; No deformity  SKIN: Warm and dry NEUROLOGIC:  Alert and oriented x 3 PSYCHIATRIC:  Normal affect   ASSESSMENT:    1. Coronary artery disease of native artery of native heart with stable angina pectoris ([place])   2. Essential hypertension    PLAN:    In order of problems listed above:  Dilated aortic root -4.1 cm on CT scan last in November 2020.  We will continue to monitor.  Continue with good blood pressure control.  Coronary artery disease -Nonobstructive disease previously diagnosed.  Continue with Toprol, isosorbide 60.  Hyperlipidemia -On Crestor as well as Zetia prior LDL 108 in 2020 goal will be less than 70 given her underlying coronary artery disease.  Likely could not tolerate higher Crestor dose.  Essential hypertension currently reasonably controlled.  Medications prescribed as above.  Benicar HCT. --been low at times, no energy. 80's we will go ahead and discontinue her isosorbide 60 mg.  GERD -Continue with PPI.  Aortic atherosclerosis -Continue with statin therapy.  Blood pressure control.  6 month  Medication Adjustments/Labs and Tests Ordered: Current medicines are  reviewed at length with the patient today.  Concerns regarding medicines are outlined above.  Orders Placed This Encounter  Procedures   EKG 12-Lead   No orders of the defined types were placed in this encounter.   Patient Instructions  Medication Instructions:  Please discontinue Isosorbide.  Continue all other medications as listed.  *If you need a refill on your cardiac medications before your next appointment, please call your pharmacy*  Follow-Up: At [facility], you and your health needs are our priority.  As part of our continuing mission to provide you with exceptional heart care, we have created designated Provider Care Teams.  These Care Teams include your primary Cardiologist (physician) and Advanced Practice Providers (APPs -  Physician Assistants and Nurse Practitioners) who all work together to provide you with the care you need, when you need it.  We recommend signing up for the patient portal called "MyChart".  Sign up information is provided on this After Visit Summary.  MyChart is used to connect with patients for Virtual Visits (Telemedicine).  Patients are able to view lab/test results, encounter notes, upcoming appointments, etc.  Non-urgent messages can be sent to your provider as well.   To learn more about what you can do with MyChart, go to [email or web address].    Your next appointment:   6 month(s)  The format for your next appointment:   In Person  Provider:   Candee Furbish, MD   Thank you for choosing [facility]!!         Signed, Candee Furbish, MD  03/19/2020 5:18 PM    [place]

## 2020-03-23 DIAGNOSIS — R0781 Pleurodynia: Secondary | ICD-10-CM | POA: Diagnosis not present

## 2020-03-23 DIAGNOSIS — R0789 Other chest pain: Secondary | ICD-10-CM | POA: Diagnosis not present

## 2020-03-23 DIAGNOSIS — M546 Pain in thoracic spine: Secondary | ICD-10-CM | POA: Diagnosis not present

## 2020-03-23 DIAGNOSIS — M545 Low back pain: Secondary | ICD-10-CM | POA: Diagnosis not present

## 2020-03-23 DIAGNOSIS — Z6823 Body mass index (BMI) 23.0-23.9, adult: Secondary | ICD-10-CM | POA: Diagnosis not present

## 2020-03-25 DIAGNOSIS — L570 Actinic keratosis: Secondary | ICD-10-CM | POA: Diagnosis not present

## 2020-04-09 DIAGNOSIS — Z23 Encounter for immunization: Secondary | ICD-10-CM | POA: Diagnosis not present

## 2020-05-02 DIAGNOSIS — H35433 Paving stone degeneration of retina, bilateral: Secondary | ICD-10-CM | POA: Diagnosis not present

## 2020-05-24 DIAGNOSIS — R0789 Other chest pain: Secondary | ICD-10-CM | POA: Diagnosis not present

## 2020-05-24 DIAGNOSIS — Z6823 Body mass index (BMI) 23.0-23.9, adult: Secondary | ICD-10-CM | POA: Diagnosis not present

## 2020-05-25 DIAGNOSIS — H43811 Vitreous degeneration, right eye: Secondary | ICD-10-CM | POA: Diagnosis not present

## 2020-06-24 DIAGNOSIS — E559 Vitamin D deficiency, unspecified: Secondary | ICD-10-CM | POA: Diagnosis not present

## 2020-06-24 DIAGNOSIS — R7989 Other specified abnormal findings of blood chemistry: Secondary | ICD-10-CM | POA: Diagnosis not present

## 2020-06-24 DIAGNOSIS — E7800 Pure hypercholesterolemia, unspecified: Secondary | ICD-10-CM | POA: Diagnosis not present

## 2020-06-25 DIAGNOSIS — A084 Viral intestinal infection, unspecified: Secondary | ICD-10-CM | POA: Diagnosis not present

## 2020-06-25 DIAGNOSIS — Z20828 Contact with and (suspected) exposure to other viral communicable diseases: Secondary | ICD-10-CM | POA: Diagnosis not present

## 2020-06-25 DIAGNOSIS — R3915 Urgency of urination: Secondary | ICD-10-CM | POA: Diagnosis not present

## 2020-06-27 DIAGNOSIS — Z0000 Encounter for general adult medical examination without abnormal findings: Secondary | ICD-10-CM | POA: Diagnosis not present

## 2020-06-27 DIAGNOSIS — E559 Vitamin D deficiency, unspecified: Secondary | ICD-10-CM | POA: Diagnosis not present

## 2020-06-27 DIAGNOSIS — M720 Palmar fascial fibromatosis [Dupuytren]: Secondary | ICD-10-CM | POA: Diagnosis not present

## 2020-06-27 DIAGNOSIS — I10 Essential (primary) hypertension: Secondary | ICD-10-CM | POA: Diagnosis not present

## 2020-06-27 DIAGNOSIS — N289 Disorder of kidney and ureter, unspecified: Secondary | ICD-10-CM | POA: Diagnosis not present

## 2020-06-27 DIAGNOSIS — E7801 Familial hypercholesterolemia: Secondary | ICD-10-CM | POA: Diagnosis not present

## 2020-06-27 DIAGNOSIS — R945 Abnormal results of liver function studies: Secondary | ICD-10-CM | POA: Diagnosis not present

## 2020-06-27 DIAGNOSIS — R7301 Impaired fasting glucose: Secondary | ICD-10-CM | POA: Diagnosis not present

## 2020-07-04 DIAGNOSIS — Z23 Encounter for immunization: Secondary | ICD-10-CM | POA: Diagnosis not present

## 2020-08-01 DIAGNOSIS — H401111 Primary open-angle glaucoma, right eye, mild stage: Secondary | ICD-10-CM | POA: Diagnosis not present

## 2020-08-29 ENCOUNTER — Encounter: Payer: Self-pay | Admitting: Cardiology

## 2020-08-29 ENCOUNTER — Ambulatory Visit: Payer: Medicare PPO | Admitting: Cardiology

## 2020-08-29 ENCOUNTER — Other Ambulatory Visit: Payer: Self-pay

## 2020-08-29 VITALS — BP 130/80 | HR 76 | Ht 63.0 in | Wt 122.0 lb

## 2020-08-29 DIAGNOSIS — I712 Thoracic aortic aneurysm, without rupture: Secondary | ICD-10-CM

## 2020-08-29 DIAGNOSIS — I25118 Atherosclerotic heart disease of native coronary artery with other forms of angina pectoris: Secondary | ICD-10-CM | POA: Diagnosis not present

## 2020-08-29 DIAGNOSIS — I77810 Thoracic aortic ectasia: Secondary | ICD-10-CM | POA: Diagnosis not present

## 2020-08-29 DIAGNOSIS — E7800 Pure hypercholesterolemia, unspecified: Secondary | ICD-10-CM

## 2020-08-29 DIAGNOSIS — I7121 Aneurysm of the ascending aorta, without rupture: Secondary | ICD-10-CM

## 2020-08-29 NOTE — Progress Notes (Signed)
Cardiology Office Note:    Date:  08/29/2020   ID:  Heidi Wallace, DOB 1935/11/18, MRN [id number]  PCP:  Manon Hilding, MD   [place]  Cardiologist:  Candee Furbish, MD  Advanced Practice Provider:  No care team member to display Electrophysiologist:  None       Referring MD: Manon Hilding, MD     History of Present Illness:    Heidi Wallace is a 85 y.o. female here for follow-up of nonobstructive coronary artery disease, symptomatic PVCs and PACs with 4.1 cm ascending thoracic aorta.  Aortic atherosclerosis noted.  Previously described waking up at night with some epigastric discomfort and nausea.  Usually passes after she belches or drinks ginger ale.  She is been monitoring her blood pressures very closely.  Sometimes they can get quite low in the 89F systolic and she feels washed out.  At that time we stopped her isosorbide.  She is doing much better off of this medication.  She was still having some arthralgias at times.  Meloxicam does help but obviously could affect her kidneys.  She was asked to stop her Crestor and maybe this helped a little bit.  She is back on every other day Crestor.  Dr. Quintin Alto is monitoring closely.  She may experiment with taking it every day again to see how she feels.  Keitha Butte here in our clinic is her daughter in law.  Sukhmani used to work at [employer].  Past Medical History:  Diagnosis Date  . Chest pain   . Chronic kidney disease   . Coronary artery disease    cardiac cath 08/2011: heavily calcified coronary arteries without obstructive disease. 40% proximal LAD, 40% in OMs.   . GERD (gastroesophageal reflux disease)   . Headache   . History of nuclear stress test 2017   low risk study  . Hyperlipidemia   . Hypertension   . Post concussion syndrome   . PVC's (premature ventricular contractions)    PVCs and ventricular bigeminy with abnormal nuclear test    Past Surgical History:   Procedure Laterality Date  . BIOPSY  07/07/2018   Procedure: BIOPSY;  Surgeon: Rogene Houston, MD;  [facility];  Service: Endoscopy;;  gastric polyp  . BREAST CYST EXCISION  benign right breast   1982  . CARDIAC CATHETERIZATION  07/2011   [facility]; [place]  . COLONOSCOPY N/A 07/07/2018   Procedure: COLONOSCOPY;  Surgeon: Rogene Houston, MD;  [facility];  Service: Endoscopy;  Laterality: N/A;  12:25  . ESOPHAGOGASTRODUODENOSCOPY N/A 07/07/2018   Procedure: ESOPHAGOGASTRODUODENOSCOPY (EGD);  Surgeon: Rogene Houston, MD;  [facility];  Service: Endoscopy;  Laterality: N/A;  . GALLBLADDER SURGERY  2003  . POLYPECTOMY  07/07/2018   Procedure: POLYPECTOMY;  Surgeon: Rogene Houston, MD;  [facility];  Service: Endoscopy;;  colon   . ROTATOR CUFF REPAIR  right 1989  . TUBAL LIGATION  1974    Current Medications: Current Meds  Medication Sig  . aspirin EC 81 MG tablet Take 1 tablet (81 mg total) by mouth daily.  . cetirizine (ZYRTEC) 10 MG tablet Take 10 mg by mouth daily as needed for allergies (sinus drainage.).   Marland Kitchen Cholecalciferol (VITAMIN D3) 50 MCG (2000 UT) capsule Take 2,000 Units by mouth daily.  Marland Kitchen esomeprazole (NEXIUM) 40 MG capsule Take 40 mg by mouth daily.   Marland Kitchen ezetimibe (ZETIA) 10 MG tablet  Take 10 mg by mouth daily.   Marland Kitchen gabapentin (NEURONTIN) 300 MG capsule Take 300 mg by mouth every evening. (1900)  . metoprolol succinate (TOPROL-XL) 50 MG 24 hr tablet Take 50 mg by mouth daily at 6 PM. (1700)  . olmesartan-hydrochlorothiazide (BENICAR HCT) 40-12.5 MG per tablet Take 0.5 tablets by mouth daily.  . rosuvastatin (CRESTOR) 5 MG tablet Take 1 tablet (5 mg total) by mouth daily. (Patient taking differently: Take 5 mg by mouth every other day.)  . travoprost, benzalkonium, (TRAVATAN) 0.004 % ophthalmic solution Place 1 drop into both eyes at bedtime.   . Wheat Dextrin (BENEFIBER DRINK MIX PO) Take 1 Package by mouth daily.      Allergies:    Atorvastatin, Clindamycin/lincomycin, Levaquin [levofloxacin in d5w], Morphine, Ceclor [cefaclor], Cephalosporins, Keflex [cephalexin], and Penicillins   Social History   Socioeconomic History  . Marital status: Married    Spouse name: Not on file  . Number of children: Not on file  . Years of education: Not on file  . Highest education level: Not on file  Occupational History  . Occupation: former Therapist, sports  Tobacco Use  . Smoking status: Former Smoker    Packs/day: 1.00    Years: 16.00    Pack years: 16.00    Types: Cigarettes    Quit date: 06/29/1972    Years since quitting: 48.2  . Smokeless tobacco: Never Used  Vaping Use  . Vaping Use: Never used  Substance and Sexual Activity  . Alcohol use: No  . Drug use: No  . Sexual activity: Not on file  Other Topics Concern  . Not on file  Social History Narrative  . Not on file   Social Determinants of Health   Financial Resource Strain: Not on file  Food Insecurity: Not on file  Transportation Needs: Not on file  Physical Activity: Not on file  Stress: Not on file  Social Connections: Not on file     Family History: The patient's family history includes Heart disease in her father and mother.  ROS:   Please see the history of present illness.     All other systems reviewed and are negative.  EKGs/Labs/Other Studies Reviewed:     Recent Labs: No results found for requested labs within last 8760 hours.  Recent Lipid Panel No results found for: CHOL, TRIG, HDL, CHOLHDL, VLDL, LDLCALC, LDLDIRECT   Risk Assessment/Calculations:      Physical Exam:    VS:  BP 130/80 (BP Location: Left Arm, Patient Position: Sitting, Cuff Size: Normal)   Pulse 76   Ht 5\' 3"  (1.6 m)   Wt 122 lb (55.3 kg)   SpO2 96%   BMI 21.61 kg/m     Wt Readings from Last 3 Encounters:  08/29/20 122 lb (55.3 kg)  03/19/20 130 lb (59 kg)  03/27/19 130 lb 12.8 oz (59.3 kg)     GEN:  Well nourished, well developed in no acute distress HEENT:  Normal NECK: No JVD; No carotid bruits LYMPHATICS: No lymphadenopathy CARDIAC: RRR, no murmurs, rubs, gallops RESPIRATORY:  Clear to auscultation without rales, wheezing or rhonchi  ABDOMEN: Soft, non-tender, non-distended MUSCULOSKELETAL:  No edema; No deformity  SKIN: Warm and dry NEUROLOGIC:  Alert and oriented x 3 PSYCHIATRIC:  Normal affect   ASSESSMENT:    1. Coronary artery disease of native artery of native heart with stable angina pectoris ([place])   2. Thoracic ascending aortic aneurysm ([place])   3. Pure hypercholesterolemia   4. Dilated  aortic root ([facility])    PLAN:    In order of problems listed above:  Coronary artery disease -Nonobstructive previously diagnosed currently on metoprolol.  Isosorbide was stopped previously.  Hypotension.  Doing well off of this medication.  Dilated aortic root -4.1 cm on CT November 2020.  We will continue to monitor.  Good blood pressure control. -I will check an echocardiogram to ensure stability.  Hyperlipidemia/a degree of statin intolerance, arthralgias -On Crestor Zetia.  LDL 108 back in 2020.  Dr. Quintin Alto will recheck. Had some problems with statin drugs at times. Stopped Crestor then now QOD.   Essential hypertension -Somewhat labile.  Stopped her isosorbide 60 in the past because of 27P systolic.  Continue with current regimen Benicar HCT.  GERD -PPI  Aortic atherosclerosis -Statin therapy blood pressure control       Medication Adjustments/Labs and Tests Ordered: Current medicines are reviewed at length with the patient today.  Concerns regarding medicines are outlined above.  Orders Placed This Encounter  Procedures  . ECHOCARDIOGRAM COMPLETE   No orders of the defined types were placed in this encounter.   Patient Instructions  Medication Instructions:  Your physician recommends that you continue on your current medications as directed. Please refer to the Current Medication list given to you today. *If you need  a refill on your cardiac medications before your next appointment, please call your pharmacy*    Testing/Procedures: Your physician has requested that you have an echocardiogram. Echocardiography is a painless test that uses sound waves to create images of your heart. It provides your doctor with information about the size and shape of your heart and how well your heart's chambers and valves are working. This procedure takes approximately one hour. There are no restrictions for this procedure.    Follow-Up: At [facility], you and your health needs are our priority.  As part of our continuing mission to provide you with exceptional heart care, we have created designated Provider Care Teams.  These Care Teams include your primary Cardiologist (physician) and Advanced Practice Providers (APPs -  Physician Assistants and Nurse Practitioners) who all work together to provide you with the care you need, when you need it.   Your next appointment:   12 month(s)  The format for your next appointment:   In Person  Provider:   You may see Candee Furbish, MD or one of the following Advanced Practice Providers on your designated Care Team:    Kathyrn Drown, NP        Signed, Candee Furbish, MD  08/29/2020 11:47 AM    [place]

## 2020-08-29 NOTE — Patient Instructions (Signed)
Medication Instructions:  Your physician recommends that you continue on your current medications as directed. Please refer to the Current Medication list given to you today. *If you need a refill on your cardiac medications before your next appointment, please call your pharmacy*    Testing/Procedures: Your physician has requested that you have an echocardiogram. Echocardiography is a painless test that uses sound waves to create images of your heart. It provides your doctor with information about the size and shape of your heart and how well your heart's chambers and valves are working. This procedure takes approximately one hour. There are no restrictions for this procedure.    Follow-Up: At [facility], you and your health needs are our priority.  As part of our continuing mission to provide you with exceptional heart care, we have created designated Provider Care Teams.  These Care Teams include your primary Cardiologist (physician) and Advanced Practice Providers (APPs -  Physician Assistants and Nurse Practitioners) who all work together to provide you with the care you need, when you need it.   Your next appointment:   12 month(s)  The format for your next appointment:   In Person  Provider:   You may see Candee Furbish, MD or one of the following Advanced Practice Providers on your designated Care Team:    Kathyrn Drown, NP

## 2020-09-07 DIAGNOSIS — J069 Acute upper respiratory infection, unspecified: Secondary | ICD-10-CM | POA: Diagnosis not present

## 2020-09-07 DIAGNOSIS — Z20828 Contact with and (suspected) exposure to other viral communicable diseases: Secondary | ICD-10-CM | POA: Diagnosis not present

## 2020-09-24 DIAGNOSIS — L821 Other seborrheic keratosis: Secondary | ICD-10-CM | POA: Diagnosis not present

## 2020-09-24 DIAGNOSIS — Z85828 Personal history of other malignant neoplasm of skin: Secondary | ICD-10-CM | POA: Diagnosis not present

## 2020-09-25 ENCOUNTER — Ambulatory Visit (HOSPITAL_COMMUNITY): Payer: Medicare PPO | Attending: Cardiovascular Disease

## 2020-09-25 ENCOUNTER — Other Ambulatory Visit: Payer: Self-pay

## 2020-09-25 DIAGNOSIS — I700 Atherosclerosis of aorta: Secondary | ICD-10-CM

## 2020-09-25 DIAGNOSIS — I082 Rheumatic disorders of both aortic and tricuspid valves: Secondary | ICD-10-CM | POA: Diagnosis not present

## 2020-09-25 DIAGNOSIS — Z87891 Personal history of nicotine dependence: Secondary | ICD-10-CM | POA: Diagnosis not present

## 2020-09-25 DIAGNOSIS — I129 Hypertensive chronic kidney disease with stage 1 through stage 4 chronic kidney disease, or unspecified chronic kidney disease: Secondary | ICD-10-CM | POA: Diagnosis not present

## 2020-09-25 DIAGNOSIS — I77810 Thoracic aortic ectasia: Secondary | ICD-10-CM | POA: Insufficient documentation

## 2020-09-25 DIAGNOSIS — E785 Hyperlipidemia, unspecified: Secondary | ICD-10-CM | POA: Diagnosis not present

## 2020-09-25 DIAGNOSIS — N189 Chronic kidney disease, unspecified: Secondary | ICD-10-CM | POA: Insufficient documentation

## 2020-09-25 LAB — ECHOCARDIOGRAM COMPLETE
Area-P 1/2: 3.91 cm2
P 1/2 time: 369 msec
S' Lateral: 2.5 cm

## 2020-09-27 ENCOUNTER — Other Ambulatory Visit: Payer: Self-pay | Admitting: *Deleted

## 2020-09-27 DIAGNOSIS — I351 Nonrheumatic aortic (valve) insufficiency: Secondary | ICD-10-CM

## 2020-09-27 NOTE — Progress Notes (Signed)
Stable ECHO with aortic root 42 mm (41 mm prior in 2020 on CT) Aortic regurgitation is mild to moderate  Recommend repeat echocardiogram in one year. ...  Written by Jerline Pain, MD on 09/26/2020 7:12 AM EDT

## 2020-10-16 DIAGNOSIS — N183 Chronic kidney disease, stage 3 unspecified: Secondary | ICD-10-CM | POA: Diagnosis not present

## 2020-10-16 DIAGNOSIS — R7989 Other specified abnormal findings of blood chemistry: Secondary | ICD-10-CM | POA: Diagnosis not present

## 2020-10-16 DIAGNOSIS — E7800 Pure hypercholesterolemia, unspecified: Secondary | ICD-10-CM | POA: Diagnosis not present

## 2020-10-16 DIAGNOSIS — E559 Vitamin D deficiency, unspecified: Secondary | ICD-10-CM | POA: Diagnosis not present

## 2020-10-16 DIAGNOSIS — E7801 Familial hypercholesterolemia: Secondary | ICD-10-CM | POA: Diagnosis not present

## 2020-10-21 DIAGNOSIS — R300 Dysuria: Secondary | ICD-10-CM | POA: Diagnosis not present

## 2020-10-21 DIAGNOSIS — E7801 Familial hypercholesterolemia: Secondary | ICD-10-CM | POA: Diagnosis not present

## 2020-10-21 DIAGNOSIS — N289 Disorder of kidney and ureter, unspecified: Secondary | ICD-10-CM | POA: Diagnosis not present

## 2020-10-21 DIAGNOSIS — Z23 Encounter for immunization: Secondary | ICD-10-CM | POA: Diagnosis not present

## 2020-10-21 DIAGNOSIS — R945 Abnormal results of liver function studies: Secondary | ICD-10-CM | POA: Diagnosis not present

## 2020-10-21 DIAGNOSIS — I10 Essential (primary) hypertension: Secondary | ICD-10-CM | POA: Diagnosis not present

## 2020-10-21 DIAGNOSIS — I259 Chronic ischemic heart disease, unspecified: Secondary | ICD-10-CM | POA: Diagnosis not present

## 2020-10-21 DIAGNOSIS — M720 Palmar fascial fibromatosis [Dupuytren]: Secondary | ICD-10-CM | POA: Diagnosis not present

## 2020-10-30 DIAGNOSIS — M545 Low back pain, unspecified: Secondary | ICD-10-CM | POA: Diagnosis not present

## 2020-10-30 DIAGNOSIS — L259 Unspecified contact dermatitis, unspecified cause: Secondary | ICD-10-CM | POA: Diagnosis not present

## 2020-11-11 DIAGNOSIS — D485 Neoplasm of uncertain behavior of skin: Secondary | ICD-10-CM | POA: Diagnosis not present

## 2020-11-11 DIAGNOSIS — L989 Disorder of the skin and subcutaneous tissue, unspecified: Secondary | ICD-10-CM | POA: Diagnosis not present

## 2020-11-11 DIAGNOSIS — L309 Dermatitis, unspecified: Secondary | ICD-10-CM | POA: Diagnosis not present

## 2020-12-09 DIAGNOSIS — H401111 Primary open-angle glaucoma, right eye, mild stage: Secondary | ICD-10-CM | POA: Diagnosis not present

## 2020-12-27 ENCOUNTER — Ambulatory Visit: Payer: Medicare PPO | Admitting: Internal Medicine

## 2020-12-27 ENCOUNTER — Other Ambulatory Visit: Payer: Self-pay

## 2020-12-27 ENCOUNTER — Encounter: Payer: Self-pay | Admitting: Internal Medicine

## 2020-12-27 VITALS — BP 126/66 | HR 82 | Ht 63.5 in | Wt 120.0 lb

## 2020-12-27 DIAGNOSIS — I493 Ventricular premature depolarization: Secondary | ICD-10-CM | POA: Diagnosis not present

## 2020-12-27 MED ORDER — OLMESARTAN MEDOXOMIL 20 MG PO TABS: 10.0000 mg | ORAL_TABLET | Freq: Every day | ORAL | 3 refills | Status: DC

## 2020-12-27 MED ORDER — MAGNESIUM OXIDE 400 MG PO CAPS: 400.0000 mg | ORAL_CAPSULE | Freq: Every day | ORAL | 3 refills | Status: DC

## 2020-12-27 NOTE — Patient Instructions (Addendum)
Medication Instructions:  Your physician has recommended you make the following change in your medication:  ** Try taking your Metoprolol in the morning instead of the evening.  **  Begin taking Benicar 20mg  - 1/2 tablet (10mg ) by mouth at night.  ** Begin Mg Ox 400mg  - 1 capsule daily - This is an OTC medication  *If you need a refill on your cardiac medications before your next appointment, please call your pharmacy*   Lab Work: None ordered.  If you have labs (blood work) drawn today and your tests are completely normal, you will receive your results only by: [place] (if you have MyChart) OR A paper copy in the mail If you have any lab test that is abnormal or we need to change your treatment, we will call you to review the results.   Testing/Procedures: None ordered.   Follow-Up: At [facility], you and your health needs are our priority.  As part of our continuing mission to provide you with exceptional heart care, we have created designated Provider Care Teams.  These Care Teams include your primary Cardiologist (physician) and Advanced Practice Providers (APPs -  Physician Assistants and Nurse Practitioners) who all work together to provide you with the care you need, when you need it.  We recommend signing up for the patient portal called "MyChart".  Sign up information is provided on this After Visit Summary.  MyChart is used to connect with patients for Virtual Visits (Telemedicine).  Patients are able to view lab/test results, encounter notes, upcoming appointments, etc.  Non-urgent messages can be sent to your provider as well.   To learn more about what you can do with MyChart, go to [email or web address].    Your next appointment:  Follow up as needed with Dr Caryl Comes

## 2020-12-27 NOTE — Addendum Note (Signed)
Addended by: Thora Lance on: 12/27/2020 06:22 PM   Modules accepted: Orders

## 2020-12-27 NOTE — Progress Notes (Signed)
Patient ID: Heidi Wallace, female   DOB: 1936/03/23, 85 y.o.   MRN: [id number]       ELECTROPHYSIOLOGY OFFICE  NOTE  Patient ID: Heidi Wallace, MRN: [id number], DOB/AGE: 1935/12/13 85 y.o. Admit date: (Not on file) Date of Consult: 12/27/2020  Primary Physician: Manon Hilding, MD Primary Cardiologist: MS       HPI Heidi Wallace is a 85 y.o. female seen in her own request because of symptomatic PVCs, PACs with  4.1 cm ascending aorta modest nonobstructive coronary disease  She has 3 major complaints 1-chest discomfort.  This is unrelated to exertion.  Lasting up to hours at a time, occurring once a week or so and occasionally present upon awakening in the morning.  There is no radiation.  No associated shortness of breath.  Treated and relieved by Tylenol  and gabapentin   She also complains of palpitations.  These have been known for some years and include both PACs and PVCs.  A come in flurries and then abate for months at a time.  Over the last 3 weeks they have been recurrently problematic.  When persisting like this they are associated with a loss of energy and lower blood pressure.  Provoked by caffeine, chocolate and stress.  In the past, treated with metoprolol and over the last 6 to 12 months she has increased from 25--50 mg a day, notably she takes it at night and they have not been particularly benefited.  The third issue is low blood pressure.  She has a history of orthostatic intolerance manifested by presyncope with prolonged standing.  Because of such complaints, her Imdur was recently discontinued.  Her Benicar HCT was cut in half with some modest improvement.  None of the symptoms are particularly associated with shortness of breath.  She denies shower intoleranc\\  There is a great deal of stress in caring for her husband who has dementia.  She acknowledges being exhausted.  Today,the patient denies, shortness of breath, nocturnal dyspnea, orthopnea or peripheral  edema.  There have been no syncope.  Complaints (As above)  .   DATE TEST EF   1/17 MyoView  67% % No ischemia  11/20 CT Angio   AsAo 4.1  3/22 Echo  60-65%    4/22 Ordered     Date Cr K Hgb  8/18 0.89 3.6 13.1    Past Medical History:  Diagnosis Date   Chest pain    Chronic kidney disease    Coronary artery disease    cardiac cath 08/2011: heavily calcified coronary arteries without obstructive disease. 40% proximal LAD, 40% in OMs.    GERD (gastroesophageal reflux disease)    Headache    History of nuclear stress test 2017   low risk study   Hyperlipidemia    Hypertension    Post concussion syndrome    PVC's (premature ventricular contractions)    PVCs and ventricular bigeminy with abnormal nuclear test      Surgical History:  Past Surgical History:  Procedure Laterality Date   BIOPSY  07/07/2018   Procedure: BIOPSY;  Surgeon: Rogene Houston, MD;  [facility];  Service: Endoscopy;;  gastric polyp   BREAST CYST EXCISION  benign right breast   1982   CARDIAC CATHETERIZATION  07/2011   [place]; [facility]   COLONOSCOPY N/A 07/07/2018   Procedure: COLONOSCOPY;  Surgeon: Rogene Houston, MD;  [facility];  Service: Endoscopy;  Laterality: N/A;  12:25   ESOPHAGOGASTRODUODENOSCOPY  N/A 07/07/2018   Procedure: ESOPHAGOGASTRODUODENOSCOPY (EGD);  Surgeon: Rogene Houston, MD;  [facility];  Service: Endoscopy;  Laterality: N/A;   GALLBLADDER SURGERY  2003   POLYPECTOMY  07/07/2018   Procedure: POLYPECTOMY;  Surgeon: Rogene Houston, MD;  [facility];  Service: Endoscopy;;  colon    ROTATOR CUFF REPAIR  right [place] Meds: No outpatient medications have been marked as taking for the 12/27/20 encounter (Appointment) with Deboraha Sprang, MD.    Allergies:  Allergies  Allergen Reactions   Atorvastatin     myalgia   Clindamycin/Lincomycin Other (See Comments)    c-diff    Levaquin [Levofloxacin In D5w]  Itching    At IV site including red streaking   Morphine Other (See Comments)    REACTION: Patient states that her mother coded when given this medication therefore patient refuses to take   Ceclor [Cefaclor] Rash   Cephalosporins Rash   Keflex [Cephalexin] Rash   Penicillins Hives and Rash    Has patient had a PCN reaction causing immediate rash, facial/tongue/throat swelling, SOB or lightheadedness with hypotension: Yes Has patient had a PCN reaction causing severe rash involving mucus membranes or skin necrosis: No Has patient had a PCN reaction that required hospitalization: No Has patient had a PCN reaction occurring within the last 10 years: No If all of the above answers are "NO", then may proceed with Cephalosporin use.     Social History   Socioeconomic History   Marital status: Married    Spouse name: Not on file   Number of children: Not on file   Years of education: Not on file   Highest education level: Not on file  Occupational History   Occupation: former Therapist, sports  Tobacco Use   Smoking status: Former    Packs/day: 1.00    Years: 16.00    Pack years: 16.00    Types: Cigarettes    Quit date: 06/29/1972    Years since quitting: 48.5   Smokeless tobacco: Never  Vaping Use   Vaping Use: Never used  Substance and Sexual Activity   Alcohol use: No   Drug use: No   Sexual activity: Not on file  Other Topics Concern   Not on file  Social History Narrative   Not on file   Social Determinants of Health   Financial Resource Strain: Not on file  Food Insecurity: Not on file  Transportation Needs: Not on file  Physical Activity: Not on file  Stress: Not on file  Social Connections: Not on file  Intimate Partner Violence: Not on file     Family History  Problem Relation Age of Onset   Heart disease Father    Heart disease Mother      ROS:  Please see the history of present illness.     All other systems reviewed and negative.   Physical Exam: BP 126/66    Pulse 82   Ht 5' 3.5" (1.613 m)   Wt 120 lb (54.4 kg)   SpO2 98%   BMI 20.92 kg/m  Well developed and well nourished in no acute distress HENT normal Neck supple with JVP-6-7 cm Lungs Clear Device pocket well healed; without hematoma or erythema.  There is no tethering  Irregularly irregular rate and rhythm, no  gallop No murmur Abd-soft with active BS No Clubbing cyanosis   edema Skin-warm and dry A & Oriented  Grossly  normal sensory and motor function  EKG: Sinus at 80 bpm Intervals 14/09/37 Prolonged rhythm strip demonstrates junctional beats sometimes in a pattern of bigeminy PACs in a pattern of bigeminy PVCs in a pattern of bigeminy   Assessment and Plan:  Palpitations with PVCs/PACs and PACs  Orthostatic hypotension  Chest pain-noncardiac  Thoracic aortic aneurysm  The patient has problematic palpitations that come in spurts.  PVCs, PACs and PACs.  While not life-threatening they are for her rather disruptive and separate of energy possibly because of the associated hypotension and likely related to the overall burden.  Easiest I think to try is to switch her metoprolol from night until morning as the problem is most notable during the day and scant at night.  Because of her history of hypotension, we will change her olmesartan from morning till night and further reduce the dose from 20--10 mg.  We also eliminated diuretic.  Antiarrhythmic therapy could also be used on using A1c or possibly dronaderone for suppression of the ectopy if change in a beta-blocker does not suffice.  These drugs could be used either intermittently when she has flurries are on an ongoing basis.  It is hard for me to get a sense of how frequently these flares occur  Suggested she consider palliative care      Recent studies have raised concern that fluoroquinolone antibiotics could be associated with an increased risk of aortic aneurysm or aortic dissection. You should avoid use of Cipro and  other associated antibiotics (flouroquinolone antibiotics )  It is  best to avoid activities that cause grunting or straining (medically referred to as a "valsalva maneuver"). This happens when a person bears down against a closed throat to increase the strength of arm or abdominal muscles. There's often a tendency to do this when lifting heavy weights, doing sit-ups, push-ups or chin-ups, etc., but it may be harmful.

## 2021-01-03 DIAGNOSIS — F418 Other specified anxiety disorders: Secondary | ICD-10-CM | POA: Diagnosis not present

## 2021-01-03 DIAGNOSIS — R5383 Other fatigue: Secondary | ICD-10-CM | POA: Diagnosis not present

## 2021-01-03 DIAGNOSIS — I10 Essential (primary) hypertension: Secondary | ICD-10-CM | POA: Diagnosis not present

## 2021-01-03 DIAGNOSIS — R002 Palpitations: Secondary | ICD-10-CM | POA: Diagnosis not present

## 2021-01-03 DIAGNOSIS — Z6821 Body mass index (BMI) 21.0-21.9, adult: Secondary | ICD-10-CM | POA: Diagnosis not present

## 2021-01-07 DIAGNOSIS — R519 Headache, unspecified: Secondary | ICD-10-CM | POA: Diagnosis not present

## 2021-01-07 DIAGNOSIS — G8929 Other chronic pain: Secondary | ICD-10-CM | POA: Diagnosis not present

## 2021-01-07 DIAGNOSIS — R930 Abnormal findings on diagnostic imaging of skull and head, not elsewhere classified: Secondary | ICD-10-CM | POA: Diagnosis not present

## 2021-01-07 DIAGNOSIS — Z6821 Body mass index (BMI) 21.0-21.9, adult: Secondary | ICD-10-CM | POA: Diagnosis not present

## 2021-01-10 DIAGNOSIS — G8929 Other chronic pain: Secondary | ICD-10-CM | POA: Diagnosis not present

## 2021-01-10 DIAGNOSIS — Z6821 Body mass index (BMI) 21.0-21.9, adult: Secondary | ICD-10-CM | POA: Diagnosis not present

## 2021-01-10 DIAGNOSIS — R519 Headache, unspecified: Secondary | ICD-10-CM | POA: Diagnosis not present

## 2021-02-06 DIAGNOSIS — R519 Headache, unspecified: Secondary | ICD-10-CM | POA: Diagnosis not present

## 2021-02-06 DIAGNOSIS — Z6820 Body mass index (BMI) 20.0-20.9, adult: Secondary | ICD-10-CM | POA: Diagnosis not present

## 2021-02-11 DIAGNOSIS — Z6820 Body mass index (BMI) 20.0-20.9, adult: Secondary | ICD-10-CM | POA: Diagnosis not present

## 2021-02-11 DIAGNOSIS — F418 Other specified anxiety disorders: Secondary | ICD-10-CM | POA: Diagnosis not present

## 2021-02-11 DIAGNOSIS — R002 Palpitations: Secondary | ICD-10-CM | POA: Diagnosis not present

## 2021-02-11 DIAGNOSIS — I700 Atherosclerosis of aorta: Secondary | ICD-10-CM | POA: Diagnosis not present

## 2021-02-11 DIAGNOSIS — I10 Essential (primary) hypertension: Secondary | ICD-10-CM | POA: Diagnosis not present

## 2021-02-11 DIAGNOSIS — R5383 Other fatigue: Secondary | ICD-10-CM | POA: Diagnosis not present

## 2021-02-11 DIAGNOSIS — E7849 Other hyperlipidemia: Secondary | ICD-10-CM | POA: Diagnosis not present

## 2021-02-11 DIAGNOSIS — G3184 Mild cognitive impairment, so stated: Secondary | ICD-10-CM | POA: Diagnosis not present

## 2021-03-13 DIAGNOSIS — Z23 Encounter for immunization: Secondary | ICD-10-CM | POA: Diagnosis not present

## 2021-03-19 DIAGNOSIS — Z961 Presence of intraocular lens: Secondary | ICD-10-CM | POA: Diagnosis not present

## 2021-03-19 DIAGNOSIS — H18523 Epithelial (juvenile) corneal dystrophy, bilateral: Secondary | ICD-10-CM | POA: Diagnosis not present

## 2021-03-19 DIAGNOSIS — H18593 Other hereditary corneal dystrophies, bilateral: Secondary | ICD-10-CM | POA: Diagnosis not present

## 2021-04-07 DIAGNOSIS — H40013 Open angle with borderline findings, low risk, bilateral: Secondary | ICD-10-CM | POA: Diagnosis not present

## 2021-04-08 DIAGNOSIS — L570 Actinic keratosis: Secondary | ICD-10-CM | POA: Diagnosis not present

## 2021-04-18 DIAGNOSIS — E7801 Familial hypercholesterolemia: Secondary | ICD-10-CM | POA: Diagnosis not present

## 2021-04-18 DIAGNOSIS — E7849 Other hyperlipidemia: Secondary | ICD-10-CM | POA: Diagnosis not present

## 2021-04-18 DIAGNOSIS — E559 Vitamin D deficiency, unspecified: Secondary | ICD-10-CM | POA: Diagnosis not present

## 2021-04-18 DIAGNOSIS — N183 Chronic kidney disease, stage 3 unspecified: Secondary | ICD-10-CM | POA: Diagnosis not present

## 2021-04-18 DIAGNOSIS — E782 Mixed hyperlipidemia: Secondary | ICD-10-CM | POA: Diagnosis not present

## 2021-04-18 DIAGNOSIS — E7800 Pure hypercholesterolemia, unspecified: Secondary | ICD-10-CM | POA: Diagnosis not present

## 2021-04-18 DIAGNOSIS — Z1329 Encounter for screening for other suspected endocrine disorder: Secondary | ICD-10-CM | POA: Diagnosis not present

## 2021-04-18 DIAGNOSIS — R7301 Impaired fasting glucose: Secondary | ICD-10-CM | POA: Diagnosis not present

## 2021-04-18 DIAGNOSIS — I10 Essential (primary) hypertension: Secondary | ICD-10-CM | POA: Diagnosis not present

## 2021-04-22 DIAGNOSIS — E7801 Familial hypercholesterolemia: Secondary | ICD-10-CM | POA: Diagnosis not present

## 2021-04-22 DIAGNOSIS — N189 Chronic kidney disease, unspecified: Secondary | ICD-10-CM | POA: Diagnosis not present

## 2021-04-22 DIAGNOSIS — I10 Essential (primary) hypertension: Secondary | ICD-10-CM | POA: Diagnosis not present

## 2021-04-22 DIAGNOSIS — Z1389 Encounter for screening for other disorder: Secondary | ICD-10-CM | POA: Diagnosis not present

## 2021-04-22 DIAGNOSIS — R059 Cough, unspecified: Secondary | ICD-10-CM | POA: Diagnosis not present

## 2021-04-22 DIAGNOSIS — Z1331 Encounter for screening for depression: Secondary | ICD-10-CM | POA: Diagnosis not present

## 2021-04-22 DIAGNOSIS — M720 Palmar fascial fibromatosis [Dupuytren]: Secondary | ICD-10-CM | POA: Diagnosis not present

## 2021-04-22 DIAGNOSIS — R945 Abnormal results of liver function studies: Secondary | ICD-10-CM | POA: Diagnosis not present

## 2021-04-30 DIAGNOSIS — Z961 Presence of intraocular lens: Secondary | ICD-10-CM | POA: Diagnosis not present

## 2021-04-30 DIAGNOSIS — H18523 Epithelial (juvenile) corneal dystrophy, bilateral: Secondary | ICD-10-CM | POA: Diagnosis not present

## 2021-04-30 DIAGNOSIS — H18593 Other hereditary corneal dystrophies, bilateral: Secondary | ICD-10-CM | POA: Diagnosis not present

## 2021-06-13 DIAGNOSIS — J320 Chronic maxillary sinusitis: Secondary | ICD-10-CM | POA: Diagnosis not present

## 2021-06-16 DIAGNOSIS — H401132 Primary open-angle glaucoma, bilateral, moderate stage: Secondary | ICD-10-CM | POA: Diagnosis not present

## 2021-07-16 DIAGNOSIS — M71349 Other bursal cyst, unspecified hand: Secondary | ICD-10-CM | POA: Diagnosis not present

## 2021-07-16 DIAGNOSIS — L309 Dermatitis, unspecified: Secondary | ICD-10-CM | POA: Diagnosis not present

## 2021-08-08 DIAGNOSIS — H18523 Epithelial (juvenile) corneal dystrophy, bilateral: Secondary | ICD-10-CM | POA: Diagnosis not present

## 2021-08-08 DIAGNOSIS — H18593 Other hereditary corneal dystrophies, bilateral: Secondary | ICD-10-CM | POA: Diagnosis not present

## 2021-08-08 DIAGNOSIS — Z961 Presence of intraocular lens: Secondary | ICD-10-CM | POA: Diagnosis not present

## 2021-08-11 DIAGNOSIS — M71349 Other bursal cyst, unspecified hand: Secondary | ICD-10-CM | POA: Diagnosis not present

## 2021-08-11 DIAGNOSIS — D239 Other benign neoplasm of skin, unspecified: Secondary | ICD-10-CM | POA: Diagnosis not present

## 2021-08-20 DIAGNOSIS — H401134 Primary open-angle glaucoma, bilateral, indeterminate stage: Secondary | ICD-10-CM | POA: Diagnosis not present

## 2021-08-28 ENCOUNTER — Other Ambulatory Visit: Payer: Self-pay

## 2021-08-28 ENCOUNTER — Ambulatory Visit (HOSPITAL_COMMUNITY): Payer: Medicare PPO | Attending: Cardiology

## 2021-08-28 DIAGNOSIS — I351 Nonrheumatic aortic (valve) insufficiency: Secondary | ICD-10-CM | POA: Diagnosis not present

## 2021-08-28 LAB — ECHOCARDIOGRAM COMPLETE
Area-P 1/2: 4.15 cm2
P 1/2 time: 553 msec
S' Lateral: 3.3 cm

## 2021-09-09 ENCOUNTER — Ambulatory Visit: Payer: Medicare PPO | Admitting: Cardiology

## 2021-09-09 ENCOUNTER — Other Ambulatory Visit: Payer: Self-pay

## 2021-09-09 ENCOUNTER — Encounter: Payer: Self-pay | Admitting: Cardiology

## 2021-09-09 DIAGNOSIS — I77810 Thoracic aortic ectasia: Secondary | ICD-10-CM

## 2021-09-09 DIAGNOSIS — K219 Gastro-esophageal reflux disease without esophagitis: Secondary | ICD-10-CM

## 2021-09-09 DIAGNOSIS — I10 Essential (primary) hypertension: Secondary | ICD-10-CM

## 2021-09-09 DIAGNOSIS — E7800 Pure hypercholesterolemia, unspecified: Secondary | ICD-10-CM

## 2021-09-09 DIAGNOSIS — I493 Ventricular premature depolarization: Secondary | ICD-10-CM

## 2021-09-09 NOTE — Assessment & Plan Note (Signed)
41 mm 2023.  Stable.  We will repeat echo in 1 year. ?

## 2021-09-09 NOTE — Assessment & Plan Note (Signed)
Feeling better.  She is not taking her Toprol in the a.m. hours.  Doing well.  No need for antiarrhythmic such as Multaq at this time.  Appreciate Dr. Olin Pia assistance. ?

## 2021-09-09 NOTE — Assessment & Plan Note (Signed)
Continue with Zetia and Crestor 5 mg low-dose every other day.  LDL 100.  No myalgias. ?

## 2021-09-09 NOTE — Patient Instructions (Signed)
Medication Instructions:  ? ?Your physician recommends that you continue on your current medications as directed. Please refer to the Current Medication list given to you today. ? ?*If you need a refill on your cardiac medications before your next appointment, please call your pharmacy* ? ? ?Follow-Up: ?At [facility], you and your health needs are our priority.  As part of our continuing mission to provide you with exceptional heart care, we have created designated Provider Care Teams.  These Care Teams include your primary Cardiologist (physician) and Advanced Practice Providers (APPs -  Physician Assistants and Nurse Practitioners) who all work together to provide you with the care you need, when you need it. ? ?We recommend signing up for the patient portal called "MyChart".  Sign up information is provided on this After Visit Summary.  MyChart is used to connect with patients for Virtual Visits (Telemedicine).  Patients are able to view lab/test results, encounter notes, upcoming appointments, etc.  Non-urgent messages can be sent to your provider as well.   ?To learn more about what you can do with MyChart, go to [email or web address].   ? ?Your next appointment:   ?1 year(s) ? ?The format for your next appointment:   ?In Person ? ?Provider:   ?Candee Furbish, MD { ? ? ? ?

## 2021-09-09 NOTE — Assessment & Plan Note (Signed)
Likely cause of some of her epigastric symptoms.  On PPI. ?

## 2021-09-09 NOTE — Assessment & Plan Note (Signed)
If her blood pressure remains elevated, she could take the full 20 mg of Benicar instead of cutting it in half.  Remember at last visit she stopped her HCTZ which she had been on for several years.  She will continue to monitor her BP. ?
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
## 2021-09-13 DIAGNOSIS — J329 Chronic sinusitis, unspecified: Secondary | ICD-10-CM | POA: Diagnosis not present

## 2021-09-13 DIAGNOSIS — Z681 Body mass index (BMI) 19 or less, adult: Secondary | ICD-10-CM | POA: Diagnosis not present

## 2021-09-15 DIAGNOSIS — R059 Cough, unspecified: Secondary | ICD-10-CM | POA: Diagnosis not present

## 2021-09-15 DIAGNOSIS — Z20828 Contact with and (suspected) exposure to other viral communicable diseases: Secondary | ICD-10-CM | POA: Diagnosis not present

## 2021-09-16 IMAGING — CT CT ANGIO CHEST
2 of 6 series · 16 of 36 positions shown · IV contrast (omnipaque)
Comparison: 01/28/2017

CLINICAL DATA: Thoracic aortic aneurysm follow-up

EXAM:
CT ANGIOGRAPHY CHEST WITH CONTRAST
TECHNIQUE: Multidetector CT imaging of the chest was performed using the
standard protocol during bolus administration of intravenous
contrast. Multiplanar CT image reconstructions and MIPs were
obtained to evaluate the vascular anatomy.
CONTRAST:  100mL OMNIPAQUE IOHEXOL 350 MG/ML SOLN

[Series 7: lungs · axial · 0.53mm/px · z∈[+1321,+1545]mm · 15 of 129 slices shown]
[im 9/129  lung]
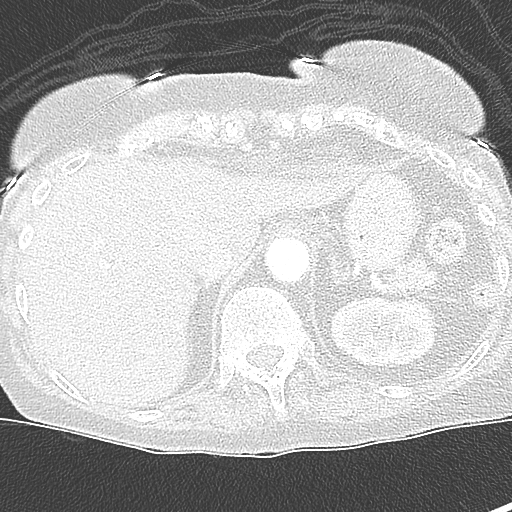
[im 17/129  mediastinal]
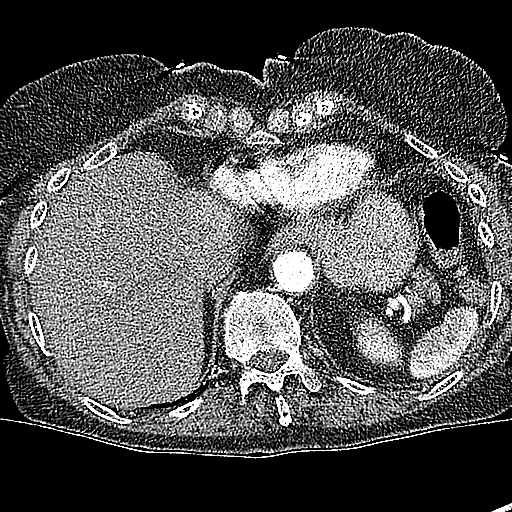
[im 25/129  lung]
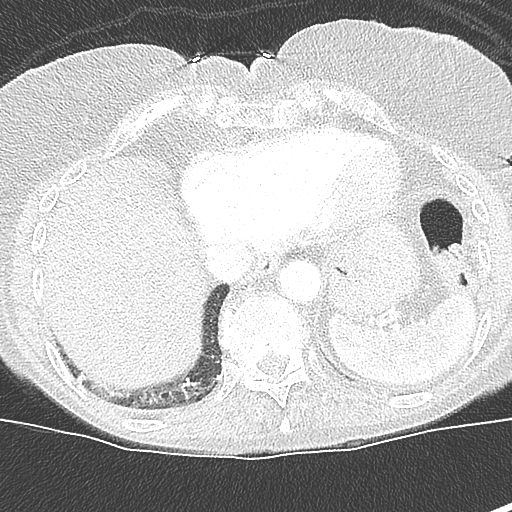
[im 33/129  mediastinal]
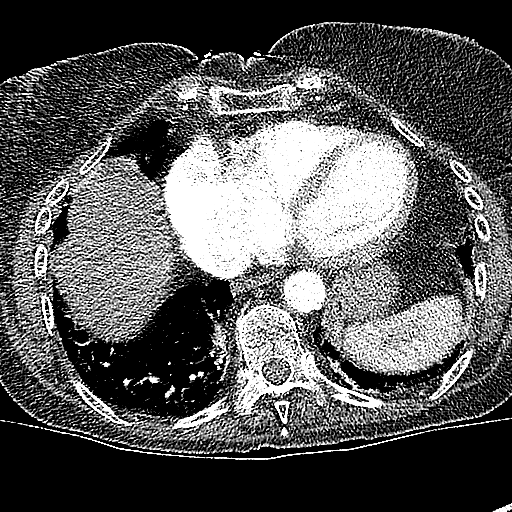
[im 41/129  lung]
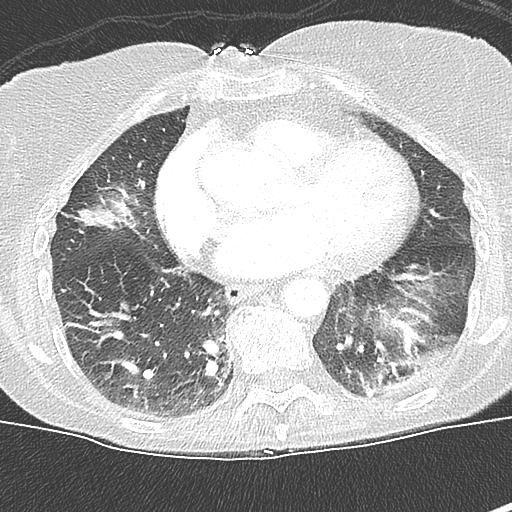
[im 49/129  mediastinal]
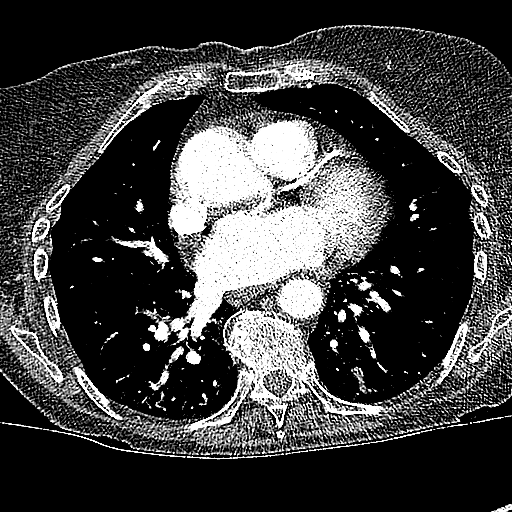
[im 57/129  lung]
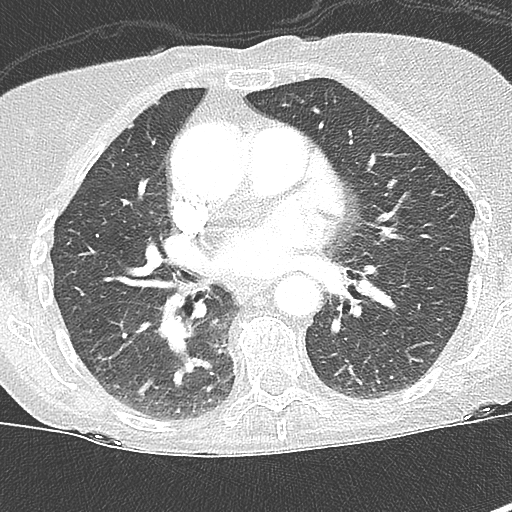
[im 65/129  mediastinal]
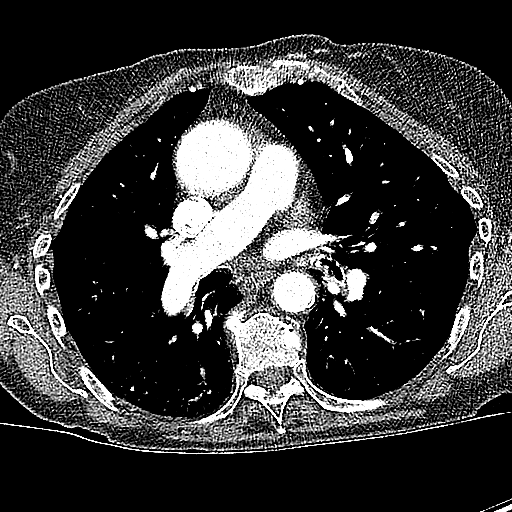
[im 73/129  lung]
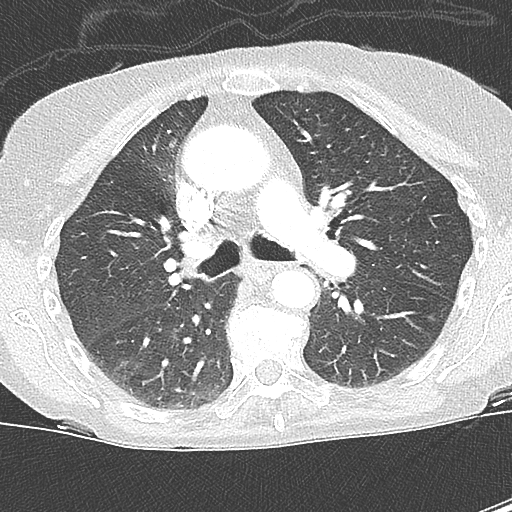
[im 81/129  mediastinal]
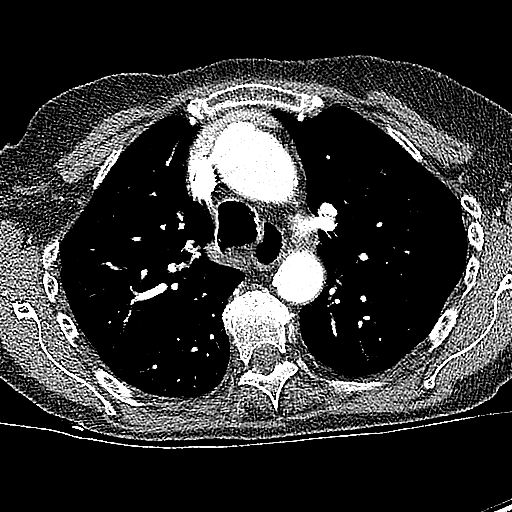
[im 89/129  lung]
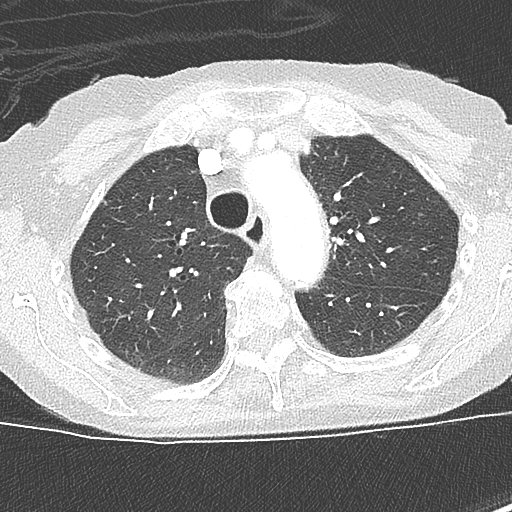
[im 97/129  mediastinal]
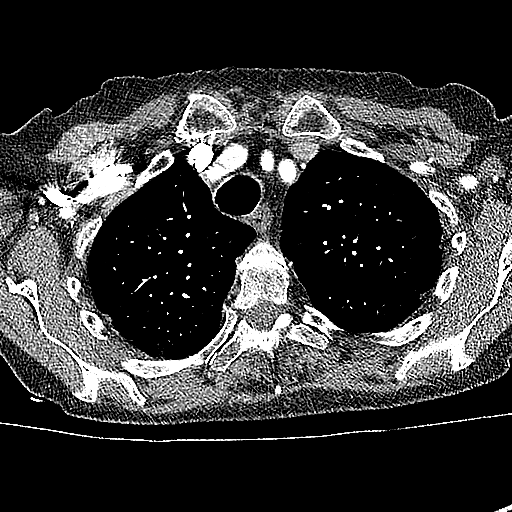
[im 105/129  lung]
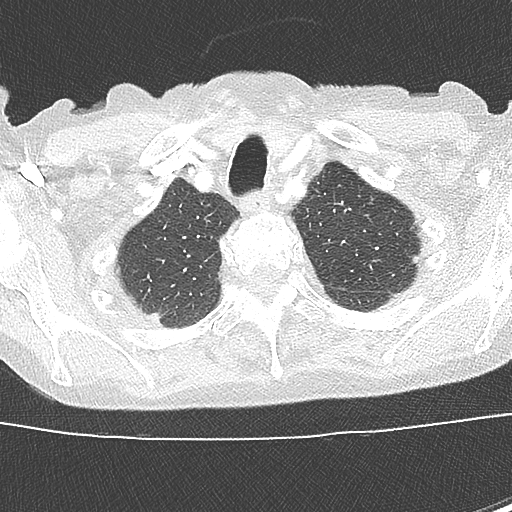
[im 113/129  mediastinal]
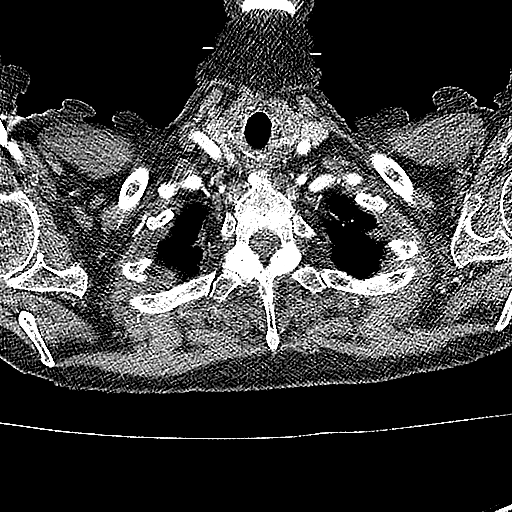
[im 121/129  lung]
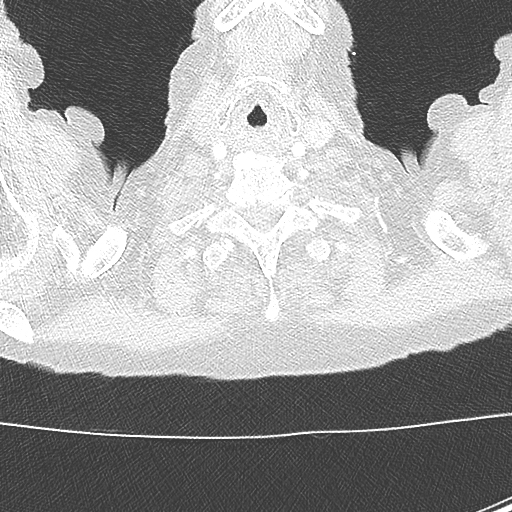

[Series 8: cor soft · coronal · 0.51mm/px · 1 of 119 slices shown]
[im 60/119  mediastinal]
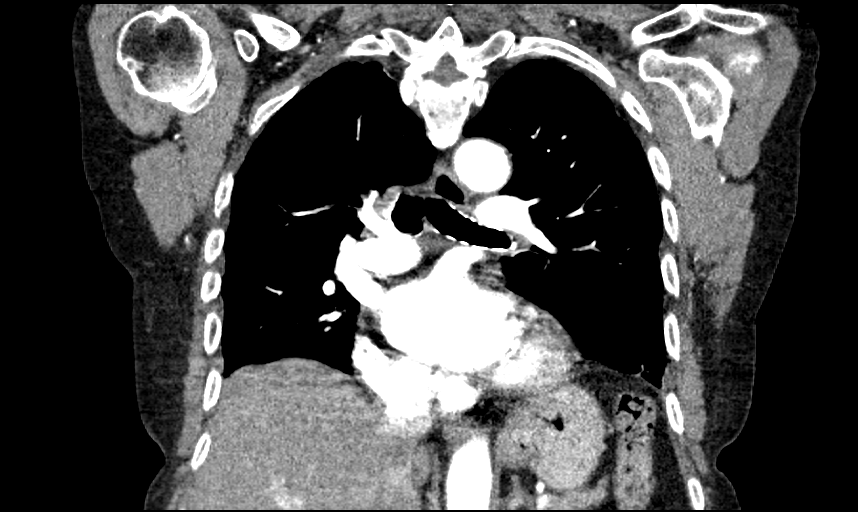

[16 of 36 positions shown; findings below may reference images not displayed]

FINDINGS: Cardiovascular: Stable 4.1 cm dilation of the ascending thoracic
aorta. Signs of aortic atherosclerosis both calcified and
noncalcified.

Heart size mildly enlarged similar to prior study with signs of
coronary artery calcification. Central pulmonary vasculature is
unremarkable.

Mediastinum/Nodes: No enlarged mediastinal, hilar, or axillary lymph
nodes. Thyroid gland, trachea, and esophagus demonstrate no
significant findings.

Lungs/Pleura: Limited assessment of lung bases due to some
respiratory motion on today's exam. Stable small pulmonary nodule in
the right middle lobe this measures approximately 3 mm. Basilar
atelectasis. No signs of effusion.

Upper Abdomen: No signs of acute finding in the upper abdomen.

Musculoskeletal: No signs of acute bone finding or destructive bone
process. Spinal degenerative change.

Review of the MIP images confirms the above findings.
IMPRESSION: 1. Stable 4.1 cm dilation of the ascending thoracic aorta. Recommend
annual imaging followup by CTA or MRA. This recommendation follows
5292 ACCF/AHA/AATS/ACR/ASA/SCA/NOMASIBULELE/NICOLAS/ROPSON/BLONDINACKA Guidelines for the
Diagnosis and Management of Patients with Thoracic Aortic Disease.
Circulation. 5292; 121: E266-e369. Aortic aneurysm NOS (JPHL0-R3M.0)
2. Signs of coronary artery calcification.
3. Stable 3 mm pulmonary nodule in the right middle lobe.

Aortic Atherosclerosis (JPHL0-QMD.D).

## 2021-10-07 DIAGNOSIS — M2550 Pain in unspecified joint: Secondary | ICD-10-CM | POA: Diagnosis not present

## 2021-10-07 DIAGNOSIS — Z681 Body mass index (BMI) 19 or less, adult: Secondary | ICD-10-CM | POA: Diagnosis not present

## 2021-10-07 DIAGNOSIS — R5383 Other fatigue: Secondary | ICD-10-CM | POA: Diagnosis not present

## 2021-10-07 DIAGNOSIS — R079 Chest pain, unspecified: Secondary | ICD-10-CM | POA: Diagnosis not present

## 2021-10-07 DIAGNOSIS — M791 Myalgia, unspecified site: Secondary | ICD-10-CM | POA: Diagnosis not present

## 2021-10-13 DIAGNOSIS — E782 Mixed hyperlipidemia: Secondary | ICD-10-CM | POA: Diagnosis not present

## 2021-10-13 DIAGNOSIS — E7800 Pure hypercholesterolemia, unspecified: Secondary | ICD-10-CM | POA: Diagnosis not present

## 2021-10-13 DIAGNOSIS — N183 Chronic kidney disease, stage 3 unspecified: Secondary | ICD-10-CM | POA: Diagnosis not present

## 2021-10-13 DIAGNOSIS — E7849 Other hyperlipidemia: Secondary | ICD-10-CM | POA: Diagnosis not present

## 2021-10-13 DIAGNOSIS — R7989 Other specified abnormal findings of blood chemistry: Secondary | ICD-10-CM | POA: Diagnosis not present

## 2021-10-13 DIAGNOSIS — E559 Vitamin D deficiency, unspecified: Secondary | ICD-10-CM | POA: Diagnosis not present

## 2021-10-13 DIAGNOSIS — E7801 Familial hypercholesterolemia: Secondary | ICD-10-CM | POA: Diagnosis not present

## 2021-10-13 DIAGNOSIS — R7301 Impaired fasting glucose: Secondary | ICD-10-CM | POA: Diagnosis not present

## 2021-10-15 DIAGNOSIS — Z1283 Encounter for screening for malignant neoplasm of skin: Secondary | ICD-10-CM | POA: Diagnosis not present

## 2021-10-15 DIAGNOSIS — Z85828 Personal history of other malignant neoplasm of skin: Secondary | ICD-10-CM | POA: Diagnosis not present

## 2021-10-15 DIAGNOSIS — L570 Actinic keratosis: Secondary | ICD-10-CM | POA: Diagnosis not present

## 2021-10-17 DIAGNOSIS — R945 Abnormal results of liver function studies: Secondary | ICD-10-CM | POA: Diagnosis not present

## 2021-10-17 DIAGNOSIS — M545 Low back pain, unspecified: Secondary | ICD-10-CM | POA: Diagnosis not present

## 2021-10-17 DIAGNOSIS — I10 Essential (primary) hypertension: Secondary | ICD-10-CM | POA: Diagnosis not present

## 2021-10-17 DIAGNOSIS — I259 Chronic ischemic heart disease, unspecified: Secondary | ICD-10-CM | POA: Diagnosis not present

## 2021-10-17 DIAGNOSIS — E559 Vitamin D deficiency, unspecified: Secondary | ICD-10-CM | POA: Diagnosis not present

## 2021-10-17 DIAGNOSIS — R7301 Impaired fasting glucose: Secondary | ICD-10-CM | POA: Diagnosis not present

## 2021-10-17 DIAGNOSIS — Z0001 Encounter for general adult medical examination with abnormal findings: Secondary | ICD-10-CM | POA: Diagnosis not present

## 2021-10-17 DIAGNOSIS — M720 Palmar fascial fibromatosis [Dupuytren]: Secondary | ICD-10-CM | POA: Diagnosis not present

## 2021-10-31 DIAGNOSIS — M79672 Pain in left foot: Secondary | ICD-10-CM | POA: Diagnosis not present

## 2021-10-31 DIAGNOSIS — M79671 Pain in right foot: Secondary | ICD-10-CM | POA: Diagnosis not present

## 2021-11-04 ENCOUNTER — Other Ambulatory Visit (HOSPITAL_COMMUNITY): Payer: Self-pay | Admitting: Orthopedic Surgery

## 2021-11-04 ENCOUNTER — Other Ambulatory Visit: Payer: Self-pay | Admitting: Orthopedic Surgery

## 2021-11-04 DIAGNOSIS — M79671 Pain in right foot: Secondary | ICD-10-CM

## 2021-11-05 ENCOUNTER — Ambulatory Visit (HOSPITAL_COMMUNITY)
Admission: RE | Admit: 2021-11-05 | Discharge: 2021-11-05 | Disposition: A | Discharge status: Home / Self Care | Payer: Medicare PPO | Source: Ambulatory Visit | Attending: Orthopedic Surgery | Admitting: Orthopedic Surgery

## 2021-11-05 DIAGNOSIS — M79671 Pain in right foot: Secondary | ICD-10-CM

## 2021-11-05 DIAGNOSIS — M79672 Pain in left foot: Secondary | ICD-10-CM | POA: Diagnosis not present

## 2021-11-12 ENCOUNTER — Encounter: Payer: Self-pay | Admitting: Vascular Surgery

## 2021-11-12 ENCOUNTER — Ambulatory Visit: Payer: Medicare PPO | Admitting: Vascular Surgery

## 2021-11-12 VITALS — BP 158/88 | HR 72 | Temp 98.2°F | Ht 62.0 in | Wt 111.2 lb

## 2021-11-12 DIAGNOSIS — I70221 Atherosclerosis of native arteries of extremities with rest pain, right leg: Secondary | ICD-10-CM

## 2021-11-12 NOTE — H&P (View-Only) (Signed)
[facility] [place]  Patient name: Heidi Wallace MRN: [id number] DOB: 1935/09/07 Sex: female  REASON FOR CONSULT: Evaluation right foot pain and ischemia  HPI: Heidi Wallace is a 86 y.o. female, who is here today for evaluation of right foot pain.  She is here today with her husband.  She is a very pleasant retired Marine scientist.  She reports that for the past month she has been having rest pain in her right foot.  This awakens her at night and she does have some relief with walking and then returns to bed and is reawakened in less than 2 hours with pain.  She does report right calf claudication but interestingly reports that this began around the same time as her rest pain.  She does not have any left leg symptoms.  She has no tissue loss.  She does have a history of prior cardiac catheterization 10 years ago which showed calcified coronary arteries but no severe obstruction.  She had low risk cardiac nuclear study in 2017.  She denies any history of stroke.  Past Medical History:  Diagnosis Date   Chest pain    Chronic kidney disease    Coronary artery disease    cardiac cath 08/2011: heavily calcified coronary arteries without obstructive disease. 40% proximal LAD, 40% in OMs.    GERD (gastroesophageal reflux disease)    Headache    History of nuclear stress test 2017   low risk study   Hyperlipidemia    Hypertension    Post concussion syndrome    PVC's (premature ventricular contractions)    PVCs and ventricular bigeminy with abnormal nuclear test    Family History  Problem Relation Age of Onset   Heart disease Father    Heart disease Mother     SOCIAL HISTORY: Social History   Socioeconomic History   Marital status: Married    Spouse name: Not on file   Number of children: Not on file   Years of education: Not on file   Highest education level: Not on file  Occupational History   Occupation: former Therapist, sports  Tobacco  Use   Smoking status: Former    Packs/day: 1.00    Years: 16.00    Pack years: 16.00    Types: Cigarettes    Quit date: 06/29/1972    Years since quitting: 49.4    Passive exposure: Never   Smokeless tobacco: Never  Vaping Use   Vaping Use: Never used  Substance and Sexual Activity   Alcohol use: No   Drug use: No   Sexual activity: Not on file  Other Topics Concern   Not on file  Social History Narrative   Not on file   Social Determinants of Health   Financial Resource Strain: Not on file  Food Insecurity: Not on file  Transportation Needs: Not on file  Physical Activity: Not on file  Stress: Not on file  Social Connections: Not on file  Intimate Partner Violence: Not on file    Allergies  Allergen Reactions   Atorvastatin     myalgia   Clindamycin/Lincomycin Other (See Comments)    c-diff    Levaquin [Levofloxacin In D5w] Itching    At IV site including red streaking   Morphine Other (See Comments)    REACTION: Patient states that her mother coded when given this medication therefore patient refuses to take   Quinolones Other (See Comments)    Hx of aortic aneurysm  Ceclor [Cefaclor] Rash   Cephalosporins Rash   Keflex [Cephalexin] Rash   Penicillins Hives and Rash    Has patient had a PCN reaction causing immediate rash, facial/tongue/throat swelling, SOB or lightheadedness with hypotension: Yes Has patient had a PCN reaction causing severe rash involving mucus membranes or skin necrosis: No Has patient had a PCN reaction that required hospitalization: No Has patient had a PCN reaction occurring within the last 10 years: No If all of the above answers are "NO", then may proceed with Cephalosporin use.     Current Outpatient Medications  Medication Sig Dispense Refill   aspirin EC 81 MG tablet Take 1 tablet (81 mg total) by mouth daily.     cetirizine (ZYRTEC) 10 MG tablet Take 10 mg by mouth daily as needed for allergies (sinus drainage.).       Cholecalciferol (VITAMIN D3) 50 MCG (2000 UT) capsule Take 2,000 Units by mouth daily.     esomeprazole (NEXIUM) 40 MG capsule Take 40 mg by mouth daily.   3   ezetimibe (ZETIA) 10 MG tablet Take 10 mg by mouth daily.      gabapentin (NEURONTIN) 300 MG capsule Take 300 mg by mouth every evening. (1900)     metoprolol succinate (TOPROL-XL) 50 MG 24 hr tablet Take 50 mg by mouth daily at 6 PM. (1700)     olmesartan (BENICAR) 20 MG tablet Take 0.5 tablets (10 mg total) by mouth daily. 45 tablet 3   ROCKLATAN 0.02-0.005 % SOLN Apply 1 drop to eye at bedtime.     rosuvastatin (CRESTOR) 5 MG tablet Take 5 mg by mouth every other day.     No current facility-administered medications for this visit.    REVIEW OF SYSTEMS:  '[X]'$  denotes positive finding, '[ ]'$  denotes negative finding Cardiac  Comments:  Chest pain or chest pressure:    Shortness of breath upon exertion:    Short of breath when lying flat:    Irregular heart rhythm: x       Vascular    Pain in calf, thigh, or hip brought on by ambulation: x   Pain in feet at night that wakes you up from your sleep:  x   Blood clot in your veins:    Leg swelling:         Pulmonary    Oxygen at home:    Productive cough:     Wheezing:         Neurologic    Sudden weakness in arms or legs:     Sudden numbness in arms or legs:     Sudden onset of difficulty speaking or slurred speech:    Temporary loss of vision in one eye:     Problems with dizziness:         Gastrointestinal    Blood in stool:     Vomited blood:         Genitourinary    Burning when urinating:     Blood in urine:        Psychiatric    Major depression:         Hematologic    Bleeding problems:    Problems with blood clotting too easily:        Skin    Rashes or ulcers:        Constitutional    Fever or chills:      PHYSICAL EXAM: Vitals:   11/12/21 1034  BP: (!) 158/88  Pulse: 72  Temp: 98.2 F (36.8 C)  TempSrc: Temporal  SpO2: 98%  Weight: 111  lb 3.2 oz (50.4 kg)  Height: '5\' 2"'$  (1.575 m)    GENERAL: The patient is a well-nourished female, in no acute distress. The vital signs are documented above. CARDIOVASCULAR: Carotid arteries without bruits bilaterally.  2+ radial pulses bilaterally.  2+ femoral pulses bilaterally.  2+ left popliteal and dorsalis pedis pulse.  On the right she has absent popliteal and distal pulses. PULMONARY: There is good air exchange  MUSCULOSKELETAL: There are no major deformities or cyanosis. NEUROLOGIC: No focal weakness or paresthesias are detected. SKIN: There are no ulcers or rashes noted.  She does have ruborous changes in her right foot with obvious ischemia PSYCHIATRIC: The patient has a normal affect.  DATA:  Noninvasive studies from 11/05/2021 reveal ankle arm index of 0.30 on the right with monophasic flow and normal at 1.09 on the left  MEDICAL ISSUES: Critical limb ischemia with rest pain right foot.  Explained that this most likely is in the superficial femoral artery region and possible tibial disease as well.  This is limb threatening.  I have recommended arteriography for further evaluation.  Also explained that there would be a possibility that this could be treated with endovascular means but also high likelihood that this would require infrainguinal bypass.  I described both of these procedures.  We will plan for outpatient arteriography and possible endovascular treatment next week.  She has upcoming funeral and memorial service on Monday and wishes to defer the arteriogram until Wednesday Thursday or Friday of next week.  She understands that further recommendations will be made pending this study.   Rosetta Posner, MD FACS [facility] [phone] Pager [phone]  Note: Portions of this report may have been transcribed using voice recognition software.  Every effort has been made to ensure accuracy; however, inadvertent computerized  transcription errors may still be present.

## 2021-11-12 NOTE — Progress Notes (Signed)
? ? ?[facility] [place] ? ?Patient name: Heidi Wallace MRN: [id number] DOB: 07/26/1935 Sex: female ? ?REASON FOR CONSULT: Evaluation right foot pain and ischemia ? ?HPI: ?Heidi Wallace is a 86 y.o. female, who is here today for evaluation of right foot pain.  She is here today with her husband.  She is a very pleasant retired Marine scientist.  She reports that for the past month she has been having rest pain in her right foot.  This awakens her at night and she does have some relief with walking and then returns to bed and is reawakened in less than 2 hours with pain.  She does report right calf claudication but interestingly reports that this began around the same time as her rest pain.  She does not have any left leg symptoms.  She has no tissue loss.  She does have a history of prior cardiac catheterization 10 years ago which showed calcified coronary arteries but no severe obstruction.  She had low risk cardiac nuclear study in 2017.  She denies any history of stroke. ? ?Past Medical History:  ?Diagnosis Date  ? Chest pain   ? Chronic kidney disease   ? Coronary artery disease   ? cardiac cath 08/2011: heavily calcified coronary arteries without obstructive disease. 40% proximal LAD, 40% in OMs.   ? GERD (gastroesophageal reflux disease)   ? Headache   ? History of nuclear stress test 2017  ? low risk study  ? Hyperlipidemia   ? Hypertension   ? Post concussion syndrome   ? PVC's (premature ventricular contractions)   ? PVCs and ventricular bigeminy with abnormal nuclear test  ? ? ?Family History  ?Problem Relation Age of Onset  ? Heart disease Father   ? Heart disease Mother   ? ? ?SOCIAL HISTORY: ?Social History  ? ?Socioeconomic History  ? Marital status: Married  ?  Spouse name: Not on file  ? Number of children: Not on file  ? Years of education: Not on file  ? Highest education level: Not on file  ?Occupational History  ? Occupation: former Therapist, sports  ?Tobacco  Use  ? Smoking status: Former  ?  Packs/day: 1.00  ?  Years: 16.00  ?  Pack years: 16.00  ?  Types: Cigarettes  ?  Quit date: 06/29/1972  ?  Years since quitting: 49.4  ?  Passive exposure: Never  ? Smokeless tobacco: Never  ?Vaping Use  ? Vaping Use: Never used  ?Substance and Sexual Activity  ? Alcohol use: No  ? Drug use: No  ? Sexual activity: Not on file  ?Other Topics Concern  ? Not on file  ?Social History Narrative  ? Not on file  ? ?Social Determinants of Health  ? ?Financial Resource Strain: Not on file  ?Food Insecurity: Not on file  ?Transportation Needs: Not on file  ?Physical Activity: Not on file  ?Stress: Not on file  ?Social Connections: Not on file  ?Intimate Partner Violence: Not on file  ? ? ?Allergies  ?Allergen Reactions  ? Atorvastatin   ?  myalgia  ? Clindamycin/Lincomycin Other (See Comments)  ?  c-diff   ? Levaquin [Levofloxacin In D5w] Itching  ?  At IV site including red streaking  ? Morphine Other (See Comments)  ?  REACTION: Patient states that her mother coded when given this medication therefore patient refuses to take  ? Quinolones Other (See Comments)  ?  Hx of aortic aneurysm  ?  Ceclor [Cefaclor] Rash  ? Cephalosporins Rash  ? Keflex [Cephalexin] Rash  ? Penicillins Hives and Rash  ?  Has patient had a PCN reaction causing immediate rash, facial/tongue/throat swelling, SOB or lightheadedness with hypotension: Yes ?Has patient had a PCN reaction causing severe rash involving mucus membranes or skin necrosis: No ?Has patient had a PCN reaction that required hospitalization: No ?Has patient had a PCN reaction occurring within the last 10 years: No ?If all of the above answers are "NO", then may proceed with Cephalosporin use. ?  ? ? ?Current Outpatient Medications  ?Medication Sig Dispense Refill  ? aspirin EC 81 MG tablet Take 1 tablet (81 mg total) by mouth daily.    ? cetirizine (ZYRTEC) 10 MG tablet Take 10 mg by mouth daily as needed for allergies (sinus drainage.).     ?  Cholecalciferol (VITAMIN D3) 50 MCG (2000 UT) capsule Take 2,000 Units by mouth daily.    ? esomeprazole (NEXIUM) 40 MG capsule Take 40 mg by mouth daily.   3  ? ezetimibe (ZETIA) 10 MG tablet Take 10 mg by mouth daily.     ? gabapentin (NEURONTIN) 300 MG capsule Take 300 mg by mouth every evening. (1900)    ? metoprolol succinate (TOPROL-XL) 50 MG 24 hr tablet Take 50 mg by mouth daily at 6 PM. (1700)    ? olmesartan (BENICAR) 20 MG tablet Take 0.5 tablets (10 mg total) by mouth daily. 45 tablet 3  ? ROCKLATAN 0.02-0.005 % SOLN Apply 1 drop to eye at bedtime.    ? rosuvastatin (CRESTOR) 5 MG tablet Take 5 mg by mouth every other day.    ? ?No current facility-administered medications for this visit.  ? ? ?REVIEW OF SYSTEMS:  ?'[X]'$  denotes positive finding, '[ ]'$  denotes negative finding ?Cardiac  Comments:  ?Chest pain or chest pressure:    ?Shortness of breath upon exertion:    ?Short of breath when lying flat:    ?Irregular heart rhythm: x   ?    ?Vascular    ?Pain in calf, thigh, or hip brought on by ambulation: x   ?Pain in feet at night that wakes you up from your sleep:  x   ?Blood clot in your veins:    ?Leg swelling:     ?    ?Pulmonary    ?Oxygen at home:    ?Productive cough:     ?Wheezing:     ?    ?Neurologic    ?Sudden weakness in arms or legs:     ?Sudden numbness in arms or legs:     ?Sudden onset of difficulty speaking or slurred speech:    ?Temporary loss of vision in one eye:     ?Problems with dizziness:     ?    ?Gastrointestinal    ?Blood in stool:     ?Vomited blood:     ?    ?Genitourinary    ?Burning when urinating:     ?Blood in urine:    ?    ?Psychiatric    ?Major depression:     ?    ?Hematologic    ?Bleeding problems:    ?Problems with blood clotting too easily:    ?    ?Skin    ?Rashes or ulcers:    ?    ?Constitutional    ?Fever or chills:    ? ? ?PHYSICAL EXAM: ?Vitals:  ? 11/12/21 1034  ?BP: (!) 158/88  ?Pulse: 72  ?  Temp: 98.2 ?F (36.8 ?C)  ?TempSrc: Temporal  ?SpO2: 98%  ?Weight: 111  lb 3.2 oz (50.4 kg)  ?Height: '5\' 2"'$  (1.575 m)  ? ? ?GENERAL: The patient is a well-nourished female, in no acute distress. The vital signs are documented above. ?CARDIOVASCULAR: Carotid arteries without bruits bilaterally.  2+ radial pulses bilaterally.  2+ femoral pulses bilaterally.  2+ left popliteal and dorsalis pedis pulse.  On the right she has absent popliteal and distal pulses. ?PULMONARY: There is good air exchange  ?MUSCULOSKELETAL: There are no major deformities or cyanosis. ?NEUROLOGIC: No focal weakness or paresthesias are detected. ?SKIN: There are no ulcers or rashes noted.  She does have ruborous changes in her right foot with obvious ischemia ?PSYCHIATRIC: The patient has a normal affect. ? ?DATA:  ?Noninvasive studies from 11/05/2021 reveal ankle arm index of 0.30 on the right with monophasic flow and normal at 1.09 on the left ? ?MEDICAL ISSUES: ?Critical limb ischemia with rest pain right foot.  Explained that this most likely is in the superficial femoral artery region and possible tibial disease as well.  This is limb threatening.  I have recommended arteriography for further evaluation.  Also explained that there would be a possibility that this could be treated with endovascular means but also high likelihood that this would require infrainguinal bypass.  I described both of these procedures.  We will plan for outpatient arteriography and possible endovascular treatment next week.  She has upcoming funeral and memorial service on Monday and wishes to defer the arteriogram until Wednesday Thursday or Friday of next week.  She understands that further recommendations will be made pending this study. ? ? ?Rosetta Posner, MD FACS ?[facility] [place] ?Office Tel [phone] ?Pager [phone] ? ?Note: Portions of this report may have been transcribed using voice recognition software.  Every effort has been made to ensure accuracy; however, inadvertent computerized  transcription errors may still be present. ? ?

## 2021-11-13 DIAGNOSIS — H401132 Primary open-angle glaucoma, bilateral, moderate stage: Secondary | ICD-10-CM | POA: Diagnosis not present

## 2021-11-14 ENCOUNTER — Other Ambulatory Visit: Payer: Self-pay

## 2021-11-14 DIAGNOSIS — I70221 Atherosclerosis of native arteries of extremities with rest pain, right leg: Secondary | ICD-10-CM

## 2021-11-18 ENCOUNTER — Ambulatory Visit (HOSPITAL_COMMUNITY)
Admission: RE | Disposition: A | Discharge status: Home / Self Care | Payer: Self-pay | Source: Home / Self Care | Attending: Surgery

## 2021-11-18 ENCOUNTER — Ambulatory Visit (HOSPITAL_COMMUNITY)
Admission: RE | Admit: 2021-11-18 | Discharge: 2021-11-18 | Disposition: A | Discharge status: Home / Self Care | Payer: Medicare PPO | Attending: Surgery | Admitting: Surgery

## 2021-11-18 ENCOUNTER — Other Ambulatory Visit: Payer: Self-pay

## 2021-11-18 DIAGNOSIS — I70221 Atherosclerosis of native arteries of extremities with rest pain, right leg: Secondary | ICD-10-CM

## 2021-11-18 DIAGNOSIS — Z87891 Personal history of nicotine dependence: Secondary | ICD-10-CM | POA: Insufficient documentation

## 2021-11-18 DIAGNOSIS — I70211 Atherosclerosis of native arteries of extremities with intermittent claudication, right leg: Secondary | ICD-10-CM | POA: Diagnosis not present

## 2021-11-18 HISTORY — PX: PERIPHERAL VASCULAR INTERVENTION: CATH118257

## 2021-11-18 HISTORY — PX: ABDOMINAL AORTOGRAM W/LOWER EXTREMITY: CATH118223

## 2021-11-18 LAB — POCT I-STAT, CHEM 8
BUN: 13 mg/dL (ref 8–23)
Calcium, Ion: 1.27 mmol/L (ref 1.15–1.40)
Chloride: 101 mmol/L (ref 98–111)
Creatinine, Ser: 0.8 mg/dL (ref 0.44–1.00)
Glucose, Bld: 98 mg/dL (ref 70–99)
HCT: 42 % (ref 36.0–46.0)
Hemoglobin: 14.3 g/dL (ref 12.0–15.0)
Potassium: 4 mmol/L (ref 3.5–5.1)
Sodium: 140 mmol/L (ref 135–145)
TCO2: 31 mmol/L (ref 22–32)

## 2021-11-18 SURGERY — ABDOMINAL AORTOGRAM W/LOWER EXTREMITY
Anesthesia: LOCAL

## 2021-11-18 MED ORDER — SODIUM CHLORIDE 0.9 % IV SOLN: INTRAVENOUS | Status: DC

## 2021-11-18 MED ORDER — HEPARIN SODIUM (PORCINE) 1000 UNIT/ML IJ SOLN
INTRAMUSCULAR | Status: DC | PRN
  Administered 2021-11-18: 5000 [IU] via INTRAVENOUS

## 2021-11-18 MED ORDER — ASPIRIN 81 MG PO TBEC: 81.0000 mg | DELAYED_RELEASE_TABLET | Freq: Every day | ORAL | Status: DC

## 2021-11-18 MED ORDER — SODIUM CHLORIDE 0.9% FLUSH: 3.0000 mL | INTRAVENOUS | Status: DC | PRN

## 2021-11-18 MED ORDER — OXYCODONE HCL 5 MG PO TABS: 5.0000 mg | ORAL_TABLET | ORAL | Status: DC | PRN

## 2021-11-18 MED ORDER — SODIUM CHLORIDE 0.9% FLUSH: 3.0000 mL | Freq: Two times a day (BID) | INTRAVENOUS | Status: DC

## 2021-11-18 MED ORDER — HEPARIN (PORCINE) IN NACL 1000-0.9 UT/500ML-% IV SOLN
INTRAVENOUS | Status: AC
  Filled 2021-11-18: qty 1000

## 2021-11-18 MED ORDER — LIDOCAINE HCL (PF) 1 % IJ SOLN
INTRAMUSCULAR | Status: DC | PRN
  Administered 2021-11-18: 12 mL

## 2021-11-18 MED ORDER — CLOPIDOGREL BISULFATE 75 MG PO TABS: 75.0000 mg | ORAL_TABLET | Freq: Every day | ORAL | 11 refills | Status: DC

## 2021-11-18 MED ORDER — ONDANSETRON HCL 4 MG/2ML IJ SOLN: 4.0000 mg | Freq: Four times a day (QID) | INTRAMUSCULAR | Status: DC | PRN

## 2021-11-18 MED ORDER — HYDRALAZINE HCL 20 MG/ML IJ SOLN: 5.0000 mg | INTRAMUSCULAR | Status: DC | PRN

## 2021-11-18 MED ORDER — ACETAMINOPHEN 325 MG PO TABS: 650.0000 mg | ORAL_TABLET | ORAL | Status: DC | PRN

## 2021-11-18 MED ORDER — LABETALOL HCL 5 MG/ML IV SOLN: 10.0000 mg | INTRAVENOUS | Status: DC | PRN

## 2021-11-18 MED ORDER — HEPARIN SODIUM (PORCINE) 1000 UNIT/ML IJ SOLN
INTRAMUSCULAR | Status: AC
  Filled 2021-11-18: qty 10

## 2021-11-18 MED ORDER — MIDAZOLAM HCL 2 MG/2ML IJ SOLN
INTRAMUSCULAR | Status: AC
  Filled 2021-11-18: qty 2

## 2021-11-18 MED ORDER — CLOPIDOGREL BISULFATE 300 MG PO TABS
ORAL_TABLET | ORAL | Status: AC
  Filled 2021-11-18: qty 1

## 2021-11-18 MED ORDER — HYDROMORPHONE HCL 1 MG/ML IJ SOLN: 0.5000 mg | INTRAMUSCULAR | Status: DC | PRN

## 2021-11-18 MED ORDER — PROTAMINE SULFATE 10 MG/ML IV SOLN
INTRAVENOUS | Status: DC | PRN
  Administered 2021-11-18: 5 mg via INTRAVENOUS

## 2021-11-18 MED ORDER — HEPARIN (PORCINE) IN NACL 1000-0.9 UT/500ML-% IV SOLN
INTRAVENOUS | Status: DC | PRN
  Administered 2021-11-18 (×2): 500 mL

## 2021-11-18 MED ORDER — CLOPIDOGREL BISULFATE 300 MG PO TABS
ORAL_TABLET | ORAL | Status: DC | PRN
  Administered 2021-11-18: 300 mg via ORAL

## 2021-11-18 MED ORDER — CLOPIDOGREL BISULFATE 75 MG PO TABS: 300.0000 mg | ORAL_TABLET | Freq: Once | ORAL | Status: DC

## 2021-11-18 MED ORDER — SODIUM CHLORIDE 0.9 % IV SOLN: 250.0000 mL | INTRAVENOUS | Status: DC | PRN

## 2021-11-18 MED ORDER — LIDOCAINE HCL (PF) 1 % IJ SOLN
INTRAMUSCULAR | Status: AC
  Filled 2021-11-18: qty 30

## 2021-11-18 MED ORDER — FENTANYL CITRATE (PF) 100 MCG/2ML IJ SOLN
INTRAMUSCULAR | Status: DC | PRN
  Administered 2021-11-18: 25 ug via INTRAVENOUS
  Administered 2021-11-18: 50 ug via INTRAVENOUS

## 2021-11-18 MED ORDER — FENTANYL CITRATE (PF) 100 MCG/2ML IJ SOLN
INTRAMUSCULAR | Status: AC
  Filled 2021-11-18: qty 2

## 2021-11-18 MED ORDER — CLOPIDOGREL BISULFATE 75 MG PO TABS: 75.0000 mg | ORAL_TABLET | Freq: Every day | ORAL | Status: DC

## 2021-11-18 MED ORDER — MIDAZOLAM HCL 2 MG/2ML IJ SOLN
INTRAMUSCULAR | Status: DC | PRN
  Administered 2021-11-18 (×2): 1 mg via INTRAVENOUS

## 2021-11-18 MED ORDER — IODIXANOL 320 MG/ML IV SOLN
INTRAVENOUS | Status: DC | PRN
  Administered 2021-11-18: 85 mL

## 2021-11-18 MED ORDER — SODIUM CHLORIDE 0.9 % WEIGHT BASED INFUSION: 1.0000 mL/kg/h | INTRAVENOUS | Status: DC

## 2021-11-18 MED ORDER — PROTAMINE SULFATE 10 MG/ML IV SOLN
INTRAVENOUS | Status: AC
  Filled 2021-11-18: qty 5

## 2021-11-18 SURGICAL SUPPLY — 21 items
BAG SNAP BAND KOVER 36X36 (MISCELLANEOUS) ×1 IMPLANT
BALLN MUSTANG 5X100X135 (BALLOONS) ×3
BALLOON MUSTANG 5X100X135 (BALLOONS) IMPLANT
CATH OMNI FLUSH 5F 65CM (CATHETERS) ×1 IMPLANT
CATH QUICKCROSS SUPP .035X90CM (MICROCATHETER) ×1 IMPLANT
COVER DOME SNAP 22 D (MISCELLANEOUS) ×1 IMPLANT
DEVICE TORQUE H2O (MISCELLANEOUS) ×1 IMPLANT
DEVICE VASC CLSR CELT ART 6 (Vascular Products) IMPLANT
GUIDEWIRE ANGLED .035X150CM (WIRE) ×1 IMPLANT
KIT ENCORE 26 ADVANTAGE (KITS) ×1 IMPLANT
KIT MICROPUNCTURE NIT STIFF (SHEATH) ×1 IMPLANT
KIT PV (KITS) ×3 IMPLANT
SHEATH PINNACLE 5F 10CM (SHEATH) ×1 IMPLANT
SHEATH PINNACLE 6F 10CM (SHEATH) ×1 IMPLANT
SHEATH PINNACLE ST 6F 45CM (SHEATH) ×1 IMPLANT
STENT ELUVIA 6X100X130 (Permanent Stent) ×1 IMPLANT
SYR MEDRAD MARK V 150ML (SYRINGE) ×1 IMPLANT
TRANSDUCER W/STOPCOCK (MISCELLANEOUS) ×3 IMPLANT
TRAY PV CATH (CUSTOM PROCEDURE TRAY) ×3 IMPLANT
WIRE BENTSON .035X145CM (WIRE) ×1 IMPLANT
WIRE HI TORQ VERSACORE 300 (WIRE) ×1 IMPLANT

## 2021-11-18 NOTE — Interval H&P Note (Signed)
History and Physical Interval Note:  11/18/2021 7:36 AM  Heidi Wallace  has presented today for surgery, with the diagnosis of critical limb ischemia of right lower extremity.  The various methods of treatment have been discussed with the patient and family. After consideration of risks, benefits and other options for treatment, the patient has consented to  Procedure(s): ABDOMINAL AORTOGRAM W/LOWER EXTREMITY (N/A) as a surgical intervention.  The patient's history has been reviewed, patient examined, no change in status, stable for surgery.  I have reviewed the patient's chart and labs.  Questions were answered to the patient's satisfaction.     Annamarie Major

## 2021-11-18 NOTE — Op Note (Signed)
Patient name: CARTHA ROTERT MRN: [id number] DOB: 1935/10/03 Sex: female  11/18/2021 Pre-operative Diagnosis: Right leg rest pain Post-operative diagnosis:  Same Surgeon:  Annamarie Major Procedure Performed:  1.  Ultrasound-guided access, left femoral artery  2.  Abdominal aortogram  3.  Bilateral lower extremity runoff  4.  Stent, right popliteal artery  5.  Conscious sedation, 52 minutes     Indications: This is an 86 year old female with new onset right leg rest pain with severe peripheral vascular disease by ultrasound.  She comes in today for angiographic evaluation and possible intervention.  Procedure:  The patient was identified in the holding area and taken to room 8.  The patient was then placed supine on the table and prepped and draped in the usual sterile fashion.  A time out was called.  Conscious sedation was administered with the use of IV fentanyl and Versed under continuous physician and nurse monitoring.  Heart rate, blood pressure, and oxygen saturation were continuously monitored.  Total sedation time was 52 minutes.  Ultrasound was used to evaluate the left common femoral artery.  It was patent .  A digital ultrasound image was acquired.  A micropuncture needle was used to access the left common femoral artery under ultrasound guidance.  An 018 wire was advanced without resistance and a micropuncture sheath was placed.  The 018 wire was removed and a benson wire was placed.  The micropuncture sheath was exchanged for a 5 french sheath.  An omniflush catheter was advanced over the wire to the level of L-1.  An abdominal angiogram was obtained.  Next, using the omniflush catheter and a benson wire, the aortic bifurcation was crossed and the catheter was placed into theright external iliac artery and right runoff was obtained.  left runoff was performed via retrograde sheath injections.  Findings:   Aortogram: No significant renal artery stenosis was identified.  The  infrarenal abdominal aorta is heavily calcified but widely patent without significant stenosis.  No significant iliac artery stenosis bilaterally.  Right Lower Extremity: The right common femoral, profundofemoral, superficial femoral artery are calcified but patent throughout their course without hemodynamically significant stenosis.  There is a 3 cm occlusion of the popliteal artery behind the patella with reconstitution of the above-knee popliteal artery.  The below-knee popliteal artery is widely patent.  There is single-vessel runoff via the anterior tibial artery down to the ankle where it occludes and sends out collaterals to the foot.  Left Lower Extremity: The left common femoral, profundofemoral, and superficial femoral artery are patent without significant stenosis.  The popliteal artery is calcified but patent without significant stenosis.  There is two-vessel runoff via the anterior tibial and peroneal artery.  There is diffuse disease out onto the foot.  Intervention: After the above images were acquired the decision was made to proceed with intervention.  A 6 French 45 cm sheath was advanced over the aortic bifurcation and placed in the right superficial femoral artery.  The patient was fully heparinized.  Next using an 035 Glidewire and a quick cross catheter, subintimal recanalization was performed.  Reentry was above the knee popliteal artery, confirmed with contrast injection.  A versa core wire was then inserted.  I selected a 6 x 100 Elluvia stent and deployed this across the lesion and postdilated it with a 5 mm balloon.  Completion imaging showed inline flow through the popliteal artery and the anterior tibial artery with no change in runoff.  I had planned on closing  the groin with a Celt however the sheath inadvertently was removed and so a manual pressure was held for hemostasis.  Protamine was given.  Impression:  #1  Short segment of right popliteal artery occlusion successfully  crossed and stented using a 6 x 100 Elluvia stent with no residual stenosis  #2  Single-vessel runoff on the right leg via the anterior tibial artery which occludes at the ankle.  There are collaterals filling the foot   V. Annamarie Major, M.D., [facility] [place] Office: [phone] Pager:  [phone]

## 2021-11-19 ENCOUNTER — Encounter (HOSPITAL_COMMUNITY): Payer: Self-pay | Admitting: Surgery

## 2021-11-19 MED FILL — Heparin Sod (Porcine)-NaCl IV Soln 1000 Unit/500ML-0.9%: INTRAVENOUS | Qty: 500 | Status: AC

## 2021-11-27 DIAGNOSIS — H401134 Primary open-angle glaucoma, bilateral, indeterminate stage: Secondary | ICD-10-CM | POA: Diagnosis not present

## 2021-12-01 DIAGNOSIS — H401134 Primary open-angle glaucoma, bilateral, indeterminate stage: Secondary | ICD-10-CM | POA: Diagnosis not present

## 2021-12-15 ENCOUNTER — Other Ambulatory Visit: Payer: Self-pay | Admitting: *Deleted

## 2021-12-15 DIAGNOSIS — I70221 Atherosclerosis of native arteries of extremities with rest pain, right leg: Secondary | ICD-10-CM

## 2021-12-24 ENCOUNTER — Encounter: Payer: Self-pay | Admitting: Vascular Surgery

## 2021-12-24 ENCOUNTER — Ambulatory Visit (INDEPENDENT_AMBULATORY_CARE_PROVIDER_SITE_OTHER): Payer: Medicare PPO

## 2021-12-24 ENCOUNTER — Ambulatory Visit (INDEPENDENT_AMBULATORY_CARE_PROVIDER_SITE_OTHER): Payer: Medicare PPO | Admitting: Vascular Surgery

## 2021-12-24 VITALS — BP 171/80 | HR 70 | Temp 98.1°F | Resp 16 | Ht 65.0 in | Wt 114.4 lb

## 2021-12-24 DIAGNOSIS — I70221 Atherosclerosis of native arteries of extremities with rest pain, right leg: Secondary | ICD-10-CM

## 2021-12-24 NOTE — Progress Notes (Signed)
[facility] [place]  Patient name: Heidi Wallace MRN: [id number] DOB: 1935/11/25 Sex: female  REASON FOR VISIT: Follow-up recent right leg intervention for critical limb ischemia  HPI: Heidi Wallace is a 86 y.o. female here today with her husband for follow-up.  She has had complete resolution of the pain and skin changes in her right foot.  She underwent endovascular treatment with Dr. Trula Slade on 11/18/2021 via left femoral approach.  She had crossing of an occluded right popliteal artery and stenting and also angioplasty of her anterior tibial artery.  Past Medical History:  Diagnosis Date   Chest pain    Chronic kidney disease    Coronary artery disease    cardiac cath 08/2011: heavily calcified coronary arteries without obstructive disease. 40% proximal LAD, 40% in OMs.    GERD (gastroesophageal reflux disease)    Headache    History of nuclear stress test 2017   low risk study   Hyperlipidemia    Hypertension    Post concussion syndrome    PVC's (premature ventricular contractions)    PVCs and ventricular bigeminy with abnormal nuclear test    Family History  Problem Relation Age of Onset   Heart disease Father    Heart disease Mother     SOCIAL HISTORY: Social History   Tobacco Use   Smoking status: Former    Packs/day: 1.00    Years: 16.00    Total pack years: 16.00    Types: Cigarettes    Quit date: 06/29/1972    Years since quitting: 49.5    Passive exposure: Never   Smokeless tobacco: Never  Substance Use Topics   Alcohol use: No    Allergies  Allergen Reactions   Clindamycin/Lincomycin Other (See Comments)    c-diff    Levaquin [Levofloxacin In D5w] Itching    At IV site including red streaking   Lipitor [Atorvastatin]     myalgia   Morphine Other (See Comments)    REACTION: Patient states that her mother coded when given this medication therefore patient refuses to take   Quinolones  Other (See Comments)    Hx of aortic aneurysm   Ceclor [Cefaclor] Rash   Cephalosporins Rash   Keflex [Cephalexin] Rash   Penicillins Hives and Rash    Has patient had a PCN reaction causing immediate rash, facial/tongue/throat swelling, SOB or lightheadedness with hypotension: Yes Has patient had a PCN reaction causing severe rash involving mucus membranes or skin necrosis: No Has patient had a PCN reaction that required hospitalization: No Has patient had a PCN reaction occurring within the last 10 years: No If all of the above answers are "NO", then may proceed with Cephalosporin use.     Current Outpatient Medications  Medication Sig Dispense Refill   aspirin EC 81 MG tablet Take 81 mg by mouth in the morning.     cetirizine (ZYRTEC) 10 MG tablet Take 10 mg by mouth daily as needed for allergies (sinus drainage.).      Cholecalciferol (VITAMIN D3) 50 MCG (2000 UT) capsule Take 2,000 Units by mouth in the morning.     clopidogrel (PLAVIX) 75 MG tablet Take 1 tablet (75 mg total) by mouth daily. 30 tablet 11   cyclobenzaprine (FLEXERIL) 10 MG tablet Take 5-10 mg by mouth 3 (three) times daily as needed for muscle spasms.     desoximetasone (TOPICORT) 0.25 % cream Apply 1 application. topically daily as needed (eczema).     esomeprazole (  NEXIUM) 40 MG capsule Take 40 mg by mouth daily before breakfast.  3   ezetimibe (ZETIA) 10 MG tablet Take 10 mg by mouth in the morning.     gabapentin (NEURONTIN) 300 MG capsule Take 300 mg by mouth daily at 6 PM.     metoprolol succinate (TOPROL-XL) 50 MG 24 hr tablet Take 50 mg by mouth in the morning.     olmesartan (BENICAR) 20 MG tablet Take 0.5 tablets (10 mg total) by mouth daily. (Patient taking differently: Take 10 mg by mouth every evening.) 45 tablet 3   promethazine (PHENERGAN) 25 MG tablet Take 25 mg by mouth every 4 (four) hours as needed for nausea or vomiting.     ROCKLATAN 0.02-0.005 % SOLN Place 1 drop into both eyes at bedtime.      rosuvastatin (CRESTOR) 5 MG tablet Take 5 mg by mouth every other day. In the morning     sodium chloride (MURO 128) 5 % ophthalmic solution Place 1 drop into both eyes in the morning and at bedtime.     No current facility-administered medications for this visit.    REVIEW OF SYSTEMS:  '[X]'$  denotes positive finding, '[ ]'$  denotes negative finding Cardiac  Comments:  Chest pain or chest pressure:    Shortness of breath upon exertion:    Short of breath when lying flat:    Irregular heart rhythm:        Vascular    Pain in calf, thigh, or hip brought on by ambulation:    Pain in feet at night that wakes you up from your sleep:     Blood clot in your veins:    Leg swelling:           PHYSICAL EXAM: Vitals:   12/24/21 1233  BP: (!) 171/80  Pulse: 70  Resp: 16  Temp: 98.1 F (36.7 C)  TempSrc: Temporal  SpO2: 97%  Weight: 114 lb 6.4 oz (51.9 kg)  Height: '5\' 5"'$  (1.651 m)    GENERAL: The patient is a well-nourished female, in no acute distress. The vital signs are documented above. CARDIOVASCULAR: Easily palpable femoral pulses bilaterally with no evidence of pulse aneurysm in the left groin.  I do palpate a popliteal pulse on the left and do not palpate 1 on the right.  Both feet are warm I do not palpate pedal pulses PULMONARY: There is good air exchange  MUSCULOSKELETAL: There are no major deformities or cyanosis. NEUROLOGIC: No focal weakness or paresthesias are detected. SKIN: There are no ulcers or rashes noted. PSYCHIATRIC: The patient has a normal affect.  DATA:  Noninvasive studies today reveal ankle arm index of 0.98 with monophasic flow in the right and 1.0 with triphasic flow on the left.  Her ankle arm index on the right preprocedural was 0.3  MEDICAL ISSUES: Excellent Jorita Bohanon result of endovascular treatment for critical limb ischemia.  She will notify should she develop any recurrent ischemic symptoms.  Otherwise we will see her again in 6 months with repeat  noninvasive studies    Rosetta Posner, MD FACS [facility] [phone]  Note: Portions of this report may have been transcribed using voice recognition software.  Every effort has been made to ensure accuracy; however, inadvertent computerized transcription errors may still be present.

## 2021-12-26 ENCOUNTER — Other Ambulatory Visit: Payer: Self-pay

## 2021-12-26 DIAGNOSIS — I739 Peripheral vascular disease, unspecified: Secondary | ICD-10-CM

## 2022-02-11 DIAGNOSIS — H18523 Epithelial (juvenile) corneal dystrophy, bilateral: Secondary | ICD-10-CM | POA: Diagnosis not present

## 2022-02-11 DIAGNOSIS — H18593 Other hereditary corneal dystrophies, bilateral: Secondary | ICD-10-CM | POA: Diagnosis not present

## 2022-02-11 DIAGNOSIS — H401134 Primary open-angle glaucoma, bilateral, indeterminate stage: Secondary | ICD-10-CM | POA: Diagnosis not present

## 2022-02-11 DIAGNOSIS — Z961 Presence of intraocular lens: Secondary | ICD-10-CM | POA: Diagnosis not present

## 2022-02-17 ENCOUNTER — Telehealth: Payer: Self-pay

## 2022-02-17 NOTE — Telephone Encounter (Signed)
Pt called stating that she had a stent placed in May of this year and was placed on Plavix. She thought she would only be taking it for 3 months, but she has refills and is not sure about continuing it.  Reviewed pt's chart, returned call for clarification, two identifiers used. Informed pt that she should continue taking it and I will clarify with Dr Early tomorrow in office and call her back. Pt has been off medication for 2 days. Instructed pt to pu refill or call pharmacy and get a few pills so she doesn't miss any more doses. Confirmed understanding.

## 2022-02-19 ENCOUNTER — Telehealth: Payer: Self-pay

## 2022-02-19 NOTE — Telephone Encounter (Signed)
Confirmed with Dr Donnetta Hutching that pt could stop Plavix and take ASA 81 mg daily. Pt called and relayed advice. Confirmed understanding.

## 2022-03-06 ENCOUNTER — Telehealth: Payer: Self-pay

## 2022-03-06 DIAGNOSIS — I70221 Atherosclerosis of native arteries of extremities with rest pain, right leg: Secondary | ICD-10-CM

## 2022-03-06 DIAGNOSIS — I739 Peripheral vascular disease, unspecified: Secondary | ICD-10-CM

## 2022-03-06 NOTE — Telephone Encounter (Signed)
Pt called stating that she was having significant pain and requested an appt.  Reviewed pt's chart, returned call for clarification, two identifiers used. Pt having significant rest pain and throbbing on the side of her R foot noticed approx 4-5 days ago. Appts scheduled for [place] and Dr Donnetta Hutching. Confirmed understanding.

## 2022-03-16 ENCOUNTER — Telehealth: Payer: Self-pay

## 2022-03-16 DIAGNOSIS — Z23 Encounter for immunization: Secondary | ICD-10-CM | POA: Diagnosis not present

## 2022-03-18 ENCOUNTER — Ambulatory Visit (INDEPENDENT_AMBULATORY_CARE_PROVIDER_SITE_OTHER): Payer: Medicare PPO

## 2022-03-18 ENCOUNTER — Encounter: Payer: Self-pay | Admitting: Vascular Surgery

## 2022-03-18 ENCOUNTER — Ambulatory Visit: Payer: Medicare PPO | Admitting: Vascular Surgery

## 2022-03-18 ENCOUNTER — Other Ambulatory Visit: Payer: Self-pay | Admitting: Vascular Surgery

## 2022-03-18 VITALS — BP 165/99 | HR 74 | Temp 98.4°F | Resp 18 | Ht 65.0 in | Wt 112.0 lb

## 2022-03-18 DIAGNOSIS — I739 Peripheral vascular disease, unspecified: Secondary | ICD-10-CM

## 2022-03-18 DIAGNOSIS — I70221 Atherosclerosis of native arteries of extremities with rest pain, right leg: Secondary | ICD-10-CM

## 2022-03-18 NOTE — Progress Notes (Signed)
[facility] [place]  Patient name: Heidi Wallace MRN: [id number] DOB: 1936/05/01 Sex: female  REASON FOR VISIT: Evaluation right foot pain  HPI: Heidi Wallace is a 86 y.o. female here today for evaluation of right foot pain.  She is accompanied by her husband.  I saw her 5 months ago where she was having rest pain in her right foot.  In May of this year she underwent arteriogram and intervention with Dr. Trula Slade.  She had a completely occluded right popliteal artery.  He was able to cross this and place a stent.  She had runoff via her anterior tibial artery which included above the ankle.  She had complete resolution of her rest pain.  She reports that several weeks ago she had onset of pain over the medial dorsum of her right foot and was concerned that she may be having recurrent symptoms.  Was present for approximately 1 to 2 weeks and then spontaneously resolved.  She was not having rest pain that awaken her at night.  She has had no tissue loss.  She reports that she did have a fall and struck her lateral malleolus months ago and still has some tenderness associated with this.  Past Medical History:  Diagnosis Date   Chest pain    Chronic kidney disease    Coronary artery disease    cardiac cath 08/2011: heavily calcified coronary arteries without obstructive disease. 40% proximal LAD, 40% in OMs.    GERD (gastroesophageal reflux disease)    Headache    History of nuclear stress test 2017   low risk study   Hyperlipidemia    Hypertension    Post concussion syndrome    PVC's (premature ventricular contractions)    PVCs and ventricular bigeminy with abnormal nuclear test    Family History  Problem Relation Age of Onset   Heart disease Father    Heart disease Mother     SOCIAL HISTORY: Social History   Tobacco Use   Smoking status: Former    Packs/day: 1.00    Years: 16.00    Total pack years: 16.00     Types: Cigarettes    Quit date: 06/29/1972    Years since quitting: 49.7    Passive exposure: Never   Smokeless tobacco: Never  Substance Use Topics   Alcohol use: No    Allergies  Allergen Reactions   Clindamycin/Lincomycin Other (See Comments)    c-diff    Levaquin [Levofloxacin In D5w] Itching    At IV site including red streaking   Lipitor [Atorvastatin]     myalgia   Morphine Other (See Comments)    REACTION: Patient states that her mother coded when given this medication therefore patient refuses to take   Quinolones Other (See Comments)    Hx of aortic aneurysm   Ceclor [Cefaclor] Rash   Cephalosporins Rash   Keflex [Cephalexin] Rash   Penicillins Hives and Rash    Has patient had a PCN reaction causing immediate rash, facial/tongue/throat swelling, SOB or lightheadedness with hypotension: Yes Has patient had a PCN reaction causing severe rash involving mucus membranes or skin necrosis: No Has patient had a PCN reaction that required hospitalization: No Has patient had a PCN reaction occurring within the last 10 years: No If all of the above answers are "NO", then may proceed with Cephalosporin use.     Current Outpatient Medications  Medication Sig Dispense Refill   aspirin EC 81 MG tablet Take  81 mg by mouth in the morning.     cetirizine (ZYRTEC) 10 MG tablet Take 10 mg by mouth daily as needed for allergies (sinus drainage.).      Cholecalciferol (VITAMIN D3) 50 MCG (2000 UT) capsule Take 2,000 Units by mouth in the morning.     clopidogrel (PLAVIX) 75 MG tablet Take 1 tablet (75 mg total) by mouth daily. 30 tablet 11   cyclobenzaprine (FLEXERIL) 10 MG tablet Take 5-10 mg by mouth 3 (three) times daily as needed for muscle spasms.     desoximetasone (TOPICORT) 0.25 % cream Apply 1 application. topically daily as needed (eczema).     esomeprazole (NEXIUM) 40 MG capsule Take 40 mg by mouth daily before breakfast.  3   ezetimibe (ZETIA) 10 MG tablet Take 10 mg by mouth  in the morning.     gabapentin (NEURONTIN) 300 MG capsule Take 300 mg by mouth daily at 6 PM.     metoprolol succinate (TOPROL-XL) 50 MG 24 hr tablet Take 50 mg by mouth in the morning.     olmesartan (BENICAR) 20 MG tablet Take 0.5 tablets (10 mg total) by mouth daily. (Patient taking differently: Take 10 mg by mouth every evening.) 45 tablet 3   promethazine (PHENERGAN) 25 MG tablet Take 25 mg by mouth every 4 (four) hours as needed for nausea or vomiting.     ROCKLATAN 0.02-0.005 % SOLN Place 1 drop into both eyes at bedtime.     rosuvastatin (CRESTOR) 5 MG tablet Take 5 mg by mouth every other day. In the morning     sodium chloride (MURO 128) 5 % ophthalmic solution Place 1 drop into both eyes in the morning and at bedtime.     No current facility-administered medications for this visit.    REVIEW OF SYSTEMS:  '[X]'$  denotes positive finding, '[ ]'$  denotes negative finding Cardiac  Comments:  Chest pain or chest pressure:    Shortness of breath upon exertion:    Short of breath when lying flat:    Irregular heart rhythm:        Vascular    Pain in calf, thigh, or hip brought on by ambulation:    Pain in feet at night that wakes you up from your sleep:     Blood clot in your veins:    Leg swelling:           PHYSICAL EXAM: Vitals:   03/18/22 1537  BP: (!) 165/99  Pulse: 74  Resp: 18  Temp: 98.4 F (36.9 C)  TempSrc: Temporal  SpO2: 100%  Weight: 112 lb (50.8 kg)  Height: '5\' 5"'$  (1.651 m)    GENERAL: The patient is a well-nourished female, in no acute distress. The vital signs are documented above. CARDIOVASCULAR: 2+ popliteal pulses bilaterally.  She does have a palpable anterior tibial pulse several centimeters above her ankle on the right.  She has a palpable dorsalis pedis pulse on the left PULMONARY: There is good air exchange  MUSCULOSKELETAL: There are no major deformities or cyanosis. NEUROLOGIC: No focal weakness or paresthesias are detected. SKIN: There are no  ulcers or rashes noted. PSYCHIATRIC: The patient has a normal affect.  DATA:  Noninvasive studies today reveal normal flow with no evidence stenosis in her popliteal stent.  ABI on the right is 0.92 and on the left 1.04  MEDICAL ISSUES: Widely patent stent in her right popliteal space with anterior tibial runoff to just above the ankle.  No evidence of recurrent  ischemia.  She was reassured with this discussion.  She will continue her usual activities and we will see her again in 6 months with repeat office visit and imaging    Rosetta Posner, MD FACS [facility] [place] Office Tel [phone]  Note: Portions of this report may have been transcribed using voice recognition software.  Every effort has been made to ensure accuracy; however, inadvertent computerized transcription errors may still be present.

## 2022-03-25 ENCOUNTER — Other Ambulatory Visit: Payer: Self-pay

## 2022-03-25 DIAGNOSIS — I70221 Atherosclerosis of native arteries of extremities with rest pain, right leg: Secondary | ICD-10-CM

## 2022-03-25 DIAGNOSIS — I739 Peripheral vascular disease, unspecified: Secondary | ICD-10-CM

## 2022-04-16 DIAGNOSIS — N183 Chronic kidney disease, stage 3 unspecified: Secondary | ICD-10-CM | POA: Diagnosis not present

## 2022-04-16 DIAGNOSIS — E559 Vitamin D deficiency, unspecified: Secondary | ICD-10-CM | POA: Diagnosis not present

## 2022-04-16 DIAGNOSIS — R7989 Other specified abnormal findings of blood chemistry: Secondary | ICD-10-CM | POA: Diagnosis not present

## 2022-04-16 DIAGNOSIS — I10 Essential (primary) hypertension: Secondary | ICD-10-CM | POA: Diagnosis not present

## 2022-04-16 DIAGNOSIS — E7849 Other hyperlipidemia: Secondary | ICD-10-CM | POA: Diagnosis not present

## 2022-04-20 DIAGNOSIS — Z1283 Encounter for screening for malignant neoplasm of skin: Secondary | ICD-10-CM | POA: Diagnosis not present

## 2022-04-20 DIAGNOSIS — Z85828 Personal history of other malignant neoplasm of skin: Secondary | ICD-10-CM | POA: Diagnosis not present

## 2022-04-20 DIAGNOSIS — D239 Other benign neoplasm of skin, unspecified: Secondary | ICD-10-CM | POA: Diagnosis not present

## 2022-04-20 DIAGNOSIS — L570 Actinic keratosis: Secondary | ICD-10-CM | POA: Diagnosis not present

## 2022-04-21 DIAGNOSIS — R059 Cough, unspecified: Secondary | ICD-10-CM | POA: Diagnosis not present

## 2022-04-21 DIAGNOSIS — R634 Abnormal weight loss: Secondary | ICD-10-CM | POA: Diagnosis not present

## 2022-04-21 DIAGNOSIS — M545 Low back pain, unspecified: Secondary | ICD-10-CM | POA: Diagnosis not present

## 2022-04-21 DIAGNOSIS — R945 Abnormal results of liver function studies: Secondary | ICD-10-CM | POA: Diagnosis not present

## 2022-04-21 DIAGNOSIS — R7301 Impaired fasting glucose: Secondary | ICD-10-CM | POA: Diagnosis not present

## 2022-04-21 DIAGNOSIS — I259 Chronic ischemic heart disease, unspecified: Secondary | ICD-10-CM | POA: Diagnosis not present

## 2022-04-21 DIAGNOSIS — E559 Vitamin D deficiency, unspecified: Secondary | ICD-10-CM | POA: Diagnosis not present

## 2022-04-21 DIAGNOSIS — E7801 Familial hypercholesterolemia: Secondary | ICD-10-CM | POA: Diagnosis not present

## 2022-04-21 DIAGNOSIS — M720 Palmar fascial fibromatosis [Dupuytren]: Secondary | ICD-10-CM | POA: Diagnosis not present

## 2022-04-23 DIAGNOSIS — H401134 Primary open-angle glaucoma, bilateral, indeterminate stage: Secondary | ICD-10-CM | POA: Diagnosis not present

## 2022-04-24 DIAGNOSIS — M25571 Pain in right ankle and joints of right foot: Secondary | ICD-10-CM | POA: Diagnosis not present

## 2022-05-09 ENCOUNTER — Encounter (INDEPENDENT_AMBULATORY_CARE_PROVIDER_SITE_OTHER): Payer: Self-pay | Admitting: Gastroenterology

## 2022-06-07 DIAGNOSIS — Z20822 Contact with and (suspected) exposure to covid-19: Secondary | ICD-10-CM | POA: Diagnosis not present

## 2022-06-07 DIAGNOSIS — R519 Headache, unspecified: Secondary | ICD-10-CM | POA: Diagnosis not present

## 2022-06-07 DIAGNOSIS — U071 COVID-19: Secondary | ICD-10-CM | POA: Diagnosis not present

## 2022-06-07 DIAGNOSIS — J0140 Acute pansinusitis, unspecified: Secondary | ICD-10-CM | POA: Diagnosis not present

## 2022-08-20 DIAGNOSIS — N189 Chronic kidney disease, unspecified: Secondary | ICD-10-CM | POA: Diagnosis not present

## 2022-08-20 DIAGNOSIS — E559 Vitamin D deficiency, unspecified: Secondary | ICD-10-CM | POA: Diagnosis not present

## 2022-08-20 DIAGNOSIS — I10 Essential (primary) hypertension: Secondary | ICD-10-CM | POA: Diagnosis not present

## 2022-08-20 DIAGNOSIS — D649 Anemia, unspecified: Secondary | ICD-10-CM | POA: Diagnosis not present

## 2022-08-20 DIAGNOSIS — N1831 Chronic kidney disease, stage 3a: Secondary | ICD-10-CM | POA: Diagnosis not present

## 2022-08-20 DIAGNOSIS — E7849 Other hyperlipidemia: Secondary | ICD-10-CM | POA: Diagnosis not present

## 2022-08-25 DIAGNOSIS — R059 Cough, unspecified: Secondary | ICD-10-CM | POA: Diagnosis not present

## 2022-08-25 DIAGNOSIS — R7301 Impaired fasting glucose: Secondary | ICD-10-CM | POA: Diagnosis not present

## 2022-08-25 DIAGNOSIS — R945 Abnormal results of liver function studies: Secondary | ICD-10-CM | POA: Diagnosis not present

## 2022-08-25 DIAGNOSIS — E559 Vitamin D deficiency, unspecified: Secondary | ICD-10-CM | POA: Diagnosis not present

## 2022-08-25 DIAGNOSIS — R634 Abnormal weight loss: Secondary | ICD-10-CM | POA: Diagnosis not present

## 2022-08-25 DIAGNOSIS — I259 Chronic ischemic heart disease, unspecified: Secondary | ICD-10-CM | POA: Diagnosis not present

## 2022-08-25 DIAGNOSIS — M720 Palmar fascial fibromatosis [Dupuytren]: Secondary | ICD-10-CM | POA: Diagnosis not present

## 2022-08-25 DIAGNOSIS — I10 Essential (primary) hypertension: Secondary | ICD-10-CM | POA: Diagnosis not present

## 2022-08-25 DIAGNOSIS — E7801 Familial hypercholesterolemia: Secondary | ICD-10-CM | POA: Diagnosis not present

## 2022-08-31 DIAGNOSIS — T148XXA Other injury of unspecified body region, initial encounter: Secondary | ICD-10-CM | POA: Diagnosis not present

## 2022-08-31 DIAGNOSIS — R030 Elevated blood-pressure reading, without diagnosis of hypertension: Secondary | ICD-10-CM | POA: Diagnosis not present

## 2022-08-31 DIAGNOSIS — M79602 Pain in left arm: Secondary | ICD-10-CM | POA: Diagnosis not present

## 2022-08-31 DIAGNOSIS — M25532 Pain in left wrist: Secondary | ICD-10-CM | POA: Diagnosis not present

## 2022-08-31 DIAGNOSIS — M25539 Pain in unspecified wrist: Secondary | ICD-10-CM | POA: Diagnosis not present

## 2022-08-31 DIAGNOSIS — Z6820 Body mass index (BMI) 20.0-20.9, adult: Secondary | ICD-10-CM | POA: Diagnosis not present

## 2022-09-01 ENCOUNTER — Other Ambulatory Visit: Payer: Self-pay

## 2022-09-01 DIAGNOSIS — S52602A Unspecified fracture of lower end of left ulna, initial encounter for closed fracture: Secondary | ICD-10-CM | POA: Diagnosis not present

## 2022-09-01 DIAGNOSIS — I739 Peripheral vascular disease, unspecified: Secondary | ICD-10-CM

## 2022-09-01 DIAGNOSIS — I70221 Atherosclerosis of native arteries of extremities with rest pain, right leg: Secondary | ICD-10-CM

## 2022-09-10 DIAGNOSIS — S52602D Unspecified fracture of lower end of left ulna, subsequent encounter for closed fracture with routine healing: Secondary | ICD-10-CM | POA: Diagnosis not present

## 2022-09-16 ENCOUNTER — Ambulatory Visit: Payer: Medicare PPO | Admitting: Vascular Surgery

## 2022-09-16 ENCOUNTER — Encounter: Payer: Self-pay | Admitting: Vascular Surgery

## 2022-09-16 ENCOUNTER — Ambulatory Visit (INDEPENDENT_AMBULATORY_CARE_PROVIDER_SITE_OTHER): Payer: Medicare PPO

## 2022-09-16 VITALS — BP 176/90 | HR 77 | Temp 97.2°F | Ht 65.0 in | Wt 117.2 lb

## 2022-09-16 DIAGNOSIS — I70221 Atherosclerosis of native arteries of extremities with rest pain, right leg: Secondary | ICD-10-CM | POA: Diagnosis not present

## 2022-09-16 DIAGNOSIS — I739 Peripheral vascular disease, unspecified: Secondary | ICD-10-CM

## 2022-09-16 LAB — VAS US ABI WITH/WO TBI
Left ABI: 1.09
Right ABI: 1.05

## 2022-09-16 NOTE — Progress Notes (Signed)
[facility] [place]  Patient name: Heidi Wallace MRN: [id number] DOB: 28-Sep-1935 Sex: female  REASON FOR VISIT: Follow-up right popliteal stent  HPI: Heidi Wallace is a 87 y.o. female here today for follow-up.  She is here with her husband.  She had presented with rest pain and critical limb ischemia to our office approximately 10 months ago.  She underwent arteriography and right popliteal stenting after crossing of the occluded popliteal artery.  This was Dr. Trula Slade on 11/18/2021.  She is continue to do quite well.  She has had no recurrence of her rest pain and no claudication  Past Medical History:  Diagnosis Date   Chest pain    Chronic kidney disease    Coronary artery disease    cardiac cath 08/2011: heavily calcified coronary arteries without obstructive disease. 40% proximal LAD, 40% in OMs.    GERD (gastroesophageal reflux disease)    Headache    History of nuclear stress test 2017   low risk study   Hyperlipidemia    Hypertension    Post concussion syndrome    PVC's (premature ventricular contractions)    PVCs and ventricular bigeminy with abnormal nuclear test    Family History  Problem Relation Age of Onset   Heart disease Father    Heart disease Mother     SOCIAL HISTORY: Social History   Tobacco Use   Smoking status: Former    Packs/day: 1.00    Years: 16.00    Additional pack years: 0.00    Total pack years: 16.00    Types: Cigarettes    Quit date: 06/29/1972    Years since quitting: 50.2    Passive exposure: Never   Smokeless tobacco: Never  Substance Use Topics   Alcohol use: No    Allergies  Allergen Reactions   Clindamycin/Lincomycin Other (See Comments)    c-diff    Levaquin [Levofloxacin In D5w] Itching    At IV site including red streaking   Lipitor [Atorvastatin]     myalgia   Morphine Other (See Comments)    REACTION: Patient states that her  mother coded when given this medication therefore patient refuses to take   Quinolones Other (See Comments)    Hx of aortic aneurysm   Ceclor [Cefaclor] Rash   Cephalosporins Rash   Keflex [Cephalexin] Rash   Penicillins Hives and Rash    Has patient had a PCN reaction causing immediate rash, facial/tongue/throat swelling, SOB or lightheadedness with hypotension: Yes Has patient had a PCN reaction causing severe rash involving mucus membranes or skin necrosis: No Has patient had a PCN reaction that required hospitalization: No Has patient had a PCN reaction occurring within the last 10 years: No If all of the above answers are "NO", then may proceed with Cephalosporin use.     Current Outpatient Medications  Medication Sig Dispense Refill   aspirin EC 81 MG tablet Take 81 mg by mouth in the morning.     cetirizine (ZYRTEC) 10 MG tablet Take 10 mg by mouth daily as needed for allergies (sinus drainage.).      Cholecalciferol (VITAMIN D3) 50 MCG (2000 UT) capsule Take 2,000 Units by mouth in the morning.     cyclobenzaprine (FLEXERIL) 10 MG tablet Take 5-10 mg by mouth  3 (three) times daily as needed for muscle spasms.     desoximetasone (TOPICORT) 0.25 % cream Apply 1 application. topically daily as needed (eczema).     esomeprazole (NEXIUM) 40 MG capsule Take 40 mg by mouth daily before breakfast.  3   ezetimibe (ZETIA) 10 MG tablet Take 10 mg by mouth in the morning.     gabapentin (NEURONTIN) 300 MG capsule Take 300 mg by mouth daily at 6 PM.     meloxicam (MOBIC) 15 MG tablet Take 15 mg by mouth daily.     metoprolol succinate (TOPROL-XL) 50 MG 24 hr tablet Take 50 mg by mouth in the morning.     olmesartan (BENICAR) 20 MG tablet Take 0.5 tablets (10 mg total) by mouth daily. (Patient taking differently: Take 10 mg by mouth every evening.) 45 tablet 3   promethazine (PHENERGAN) 25 MG tablet Take 25 mg by mouth every 4 (four) hours as needed for nausea or vomiting.     ROCKLATAN  0.02-0.005 % SOLN Place 1 drop into both eyes at bedtime.     rosuvastatin (CRESTOR) 5 MG tablet Take 5 mg by mouth every other day. In the morning     sodium chloride (MURO 128) 5 % ophthalmic solution Place 1 drop into both eyes in the morning and at bedtime.     No current facility-administered medications for this visit.    REVIEW OF SYSTEMS:  [X]  denotes positive finding, [ ]  denotes negative finding Cardiac  Comments:  Chest pain or chest pressure:    Shortness of breath upon exertion:    Short of breath when lying flat:    Irregular heart rhythm:        Vascular    Pain in calf, thigh, or hip brought on by ambulation:    Pain in feet at night that wakes you up from your sleep:     Blood clot in your veins:    Leg swelling:           PHYSICAL EXAM: Vitals:   09/16/22 1110  BP: (!) 176/90  Pulse: 77  Temp: (!) 97.2 F (36.2 C)  SpO2: 98%  Weight: 117 lb 3.2 oz (53.2 kg)  Height: 5\' 5"  (1.651 m)    GENERAL: The patient is a well-nourished female, in no acute distress. The vital signs are documented above. CARDIOVASCULAR: Palpable popliteal pulses bilaterally.  I do not palpate pedal pulses. PULMONARY: There is good air exchange  MUSCULOSKELETAL: There are no major deformities or cyanosis. NEUROLOGIC: No focal weakness or paresthesias are detected. SKIN: There are no ulcers or rashes noted. PSYCHIATRIC: The patient has a normal affect.  DATA:  Noninvasive studies today reveal widely patent right popliteal stent.  Her ankle arm index is normal with normal waveforms bilaterally  MEDICAL ISSUES: Durable result of her right popliteal stent.  She will continue her usual activity and walking program without limitations.  We will see her again in 1 year with duplex imaging of her popliteal stent and ankle arm indices    Rosetta Posner, MD FACS [facility] [place] Office Tel [phone]  Note: Portions of this report may have been  transcribed using voice recognition software.  Every effort has been made to ensure accuracy; however, inadvertent computerized transcription errors may still be present.

## 2022-09-17 ENCOUNTER — Ambulatory Visit: Payer: Medicare PPO | Admitting: Cardiology

## 2022-09-17 ENCOUNTER — Ambulatory Visit: Payer: Medicare PPO | Attending: Cardiology | Admitting: Internal Medicine

## 2022-09-17 ENCOUNTER — Encounter: Payer: Self-pay | Admitting: Internal Medicine

## 2022-09-17 VITALS — BP 130/70 | HR 91 | Ht 63.0 in | Wt 118.0 lb

## 2022-09-17 DIAGNOSIS — I493 Ventricular premature depolarization: Secondary | ICD-10-CM

## 2022-09-17 DIAGNOSIS — I25118 Atherosclerotic heart disease of native coronary artery with other forms of angina pectoris: Secondary | ICD-10-CM

## 2022-09-17 DIAGNOSIS — I77810 Thoracic aortic ectasia: Secondary | ICD-10-CM

## 2022-09-17 MED ORDER — NITROGLYCERIN 0.4 MG SL SUBL: 0.4000 mg | SUBLINGUAL_TABLET | SUBLINGUAL | 3 refills | Status: DC | PRN

## 2022-09-17 NOTE — Progress Notes (Signed)
Cardiology Office Note:    Date:  09/17/2022   ID:  Heidi Wallace, DOB 1935/08/03, MRN [id number]  PCP:  Manon Hilding, MD   [facility] Providers Cardiologist:  Candee Furbish, MD     Referring MD: Manon Hilding, MD   CC: DOD- CP  History of Present Illness:    Heidi Wallace is a 87 y.o. female here for the follow-up of PACs PVCs symptomatic with 4.1 cm ascending aorta and nonobstructive CAD.    Patient notes that she is doing ok.   Since last visit notes that since she fell and broke her arm she has been doing worse . She has right sided nocturnal and early morning chest pain She also has new central chest pain that concerns her. She notes 5 years ago did a stress tests with exercise and met target heart rate She has fallen since.  No SOB/DOE and no PND/Orthopnea.  No weight gain or leg swelling.  No palpitations or syncope .   Past Medical History:  Diagnosis Date   Chest pain    Chronic kidney disease    Coronary artery disease    cardiac cath 08/2011: heavily calcified coronary arteries without obstructive disease. 40% proximal LAD, 40% in OMs.    GERD (gastroesophageal reflux disease)    Headache    History of nuclear stress test 2017   low risk study   Hyperlipidemia    Hypertension    Post concussion syndrome    PVC's (premature ventricular contractions)    PVCs and ventricular bigeminy with abnormal nuclear test    Past Surgical History:  Procedure Laterality Date   ABDOMINAL AORTOGRAM W/LOWER EXTREMITY N/A 11/18/2021   Procedure: ABDOMINAL AORTOGRAM W/LOWER EXTREMITY;  Surgeon: Serafina Mitchell, MD;  Location: [facility];  Service: Cardiovascular;  Laterality: N/A;   BIOPSY  07/07/2018   Procedure: BIOPSY;  Surgeon: Rogene Houston, MD;  [facility];  Service: Endoscopy;;  gastric polyp   BREAST CYST EXCISION  benign right breast   1982   CARDIAC CATHETERIZATION  07/2011   [place]; [place]   COLONOSCOPY N/A 07/07/2018   Procedure:  COLONOSCOPY;  Surgeon: Rogene Houston, MD;  Location: [facility];  Service: Endoscopy;  Laterality: N/A;  12:25   ESOPHAGOGASTRODUODENOSCOPY N/A 07/07/2018   Procedure: ESOPHAGOGASTRODUODENOSCOPY (EGD);  Surgeon: Rogene Houston, MD;  Location: [facility];  Service: Endoscopy;  Laterality: N/A;   GALLBLADDER SURGERY  2003   PERIPHERAL VASCULAR INTERVENTION  11/18/2021   Procedure: PERIPHERAL VASCULAR INTERVENTION;  Surgeon: Serafina Mitchell, MD;  Location: [facility];  Service: Cardiovascular;;  right SFA   POLYPECTOMY  07/07/2018   Procedure: POLYPECTOMY;  Surgeon: Rogene Houston, MD;  [facility];  Service: Endoscopy;;  colon    ROTATOR CUFF REPAIR  right 1989   TUBAL LIGATION  1974    Current Medications: Current Meds  Medication Sig   aspirin EC 81 MG tablet Take 81 mg by mouth in the morning.   cetirizine (ZYRTEC) 10 MG tablet Take 10 mg by mouth daily as needed for allergies (sinus drainage.).    Cholecalciferol (VITAMIN D3) 50 MCG (2000 UT) capsule Take 2,000 Units by mouth in the morning.   cyclobenzaprine (FLEXERIL) 10 MG tablet Take 5-10 mg by mouth 3 (three) times daily as needed for muscle spasms.   desoximetasone (TOPICORT) 0.25 % cream Apply 1 application. topically daily as needed (eczema).   esomeprazole (NEXIUM) 40  MG capsule Take 40 mg by mouth daily before breakfast.   ezetimibe (ZETIA) 10 MG tablet Take 10 mg by mouth in the morning.   gabapentin (NEURONTIN) 300 MG capsule Take 300 mg by mouth daily at 6 PM.   meloxicam (MOBIC) 15 MG tablet Take 15 mg by mouth daily.   metoprolol succinate (TOPROL-XL) 50 MG 24 hr tablet Take 50 mg by mouth in the morning.   nitroGLYCERIN (NITROSTAT) 0.4 MG SL tablet Place 1 tablet (0.4 mg total) under the tongue every 5 (five) minutes as needed for chest pain.   olmesartan (BENICAR) 20 MG tablet Take 0.5 tablets (10 mg total) by mouth daily. (Patient taking differently: Take 10 mg by mouth every evening.)    promethazine (PHENERGAN) 25 MG tablet Take 25 mg by mouth every 4 (four) hours as needed for nausea or vomiting.   ROCKLATAN 0.02-0.005 % SOLN Place 1 drop into both eyes at bedtime.   rosuvastatin (CRESTOR) 5 MG tablet Take 5 mg by mouth every other day. In the morning   sodium chloride (MURO 128) 5 % ophthalmic solution Place 1 drop into both eyes in the morning and at bedtime.     Allergies:   Clindamycin/lincomycin, Levaquin [levofloxacin in d5w], Lipitor [atorvastatin], Morphine, Quinolones, Ceclor [cefaclor], Cephalosporins, Keflex [cephalexin], and Penicillins   Social History   Socioeconomic History   Marital status: Married    Spouse name: Not on file   Number of children: Not on file   Years of education: Not on file   Highest education level: Not on file  Occupational History   Occupation: former Therapist, sports  Tobacco Use   Smoking status: Former    Packs/day: 1.00    Years: 16.00    Additional pack years: 0.00    Total pack years: 16.00    Types: Cigarettes    Quit date: 06/29/1972    Years since quitting: 50.2    Passive exposure: Never   Smokeless tobacco: Never  Vaping Use   Vaping Use: Never used  Substance and Sexual Activity   Alcohol use: No   Drug use: No   Sexual activity: Not on file  Other Topics Concern   Not on file  Social History Narrative   Not on file   Social Determinants of Health   Financial Resource Strain: Not on file  Food Insecurity: Not on file  Transportation Needs: Not on file  Physical Activity: Not on file  Stress: Not on file  Social Connections: Not on file     Family History: The patient's family history includes Heart disease in her father and mother.  ROS:   Please see the history of present illness.     All other systems reviewed and are negative.  EKGs/Labs/Other Studies Reviewed:    The following studies were reviewed today:  09/17/22: SR with Pacs  ECHO 08/28/21:   1. Left ventricular ejection fraction, by estimation,  is 50 to 55%. The  left ventricle has low normal function. The left ventricle has no regional  wall motion abnormalities. Left ventricular diastolic parameters are  consistent with Grade I diastolic  dysfunction (impaired relaxation).   2. Right ventricular systolic function is normal. The right ventricular  size is normal. There is mildly elevated pulmonary artery systolic  pressure. The estimated right ventricular systolic pressure is 99991111 mmHg.   3. Left atrial size was mildly dilated.   4. Right atrial size was mildly dilated.   5. The mitral valve is degenerative. Mild mitral  valve regurgitation. No  evidence of mitral stenosis. Moderate mitral annular calcification.   6. Tricuspid valve regurgitation is moderate.   7. The aortic valve is tricuspid. There is mild calcification of the  aortic valve. Aortic valve regurgitation is mild.   8. Aortic dilatation noted. There is mild dilatation of the ascending  aorta, measuring 41 mm.   9. The inferior vena cava is normal in size with greater than 50%  respiratory variability, suggesting right atrial pressure of 3 mmHg.   Comparison(s): 09/25/20 EF 60-65%. Ascending aorta 32mm. Mild-moderate AI.   EKG: 12/27/2020-sinus rhythm 82 with PACs  Recent Labs: 11/18/2021: BUN 13; Creatinine, Ser 0.80; Hemoglobin 14.3; Potassium 4.0; Sodium 140  Recent Lipid Panel No results found for: "CHOL", "TRIG", "HDL", "CHOLHDL", "VLDL", "LDLCALC", "LDLDIRECT"   Physical Exam:    VS:  BP 130/70   Pulse 91   Ht 5\' 3"  (1.6 m)   Wt 118 lb (53.5 kg)   SpO2 97%   BMI 20.90 kg/m     Wt Readings from Last 3 Encounters:  09/17/22 118 lb (53.5 kg)  09/16/22 117 lb 3.2 oz (53.2 kg)  03/18/22 112 lb (50.8 kg)     GEN:  Elderly female in no acute distress HEENT: Normal NECK: No JVD CARDIAC: IRIR diastolic murmur no rubs, gallops RESPIRATORY:  Clear to auscultation without rales, wheezing or rhonchi  ABDOMEN: Soft, non-tender,  non-distended MUSCULOSKELETAL:  No edema; No deformity  SKIN: Warm and dry NEUROLOGIC:  Alert and oriented x 3 PSYCHIATRIC:  Normal affect   ASSESSMENT:    1. Coronary artery disease involving native coronary artery of native heart with other form of angina pectoris ([place])   2. Dilated aortic root ([place])   3. PVC's (premature ventricular contractions)     PLAN:    Moderate non obstructive CAD - with new CP - reviewed her 2020 CT Aorta (non gated) - with frequent ectopy; I do not think we would be able to presently get CCTA - given her falls, I would worry about exercise NM Stress test - SDM: Will get Lexi scan (we originally offered PET MPI) - will add PRN Nitro - may be GERD  Aortic dilation and AI - echo planned for 2024 will order  Follow up in three months with Dr. Marlou Porch team        Medication Adjustments/Labs and Tests Ordered: Current medicines are reviewed at length with the patient today.  Concerns regarding medicines are outlined above.  Orders Placed This Encounter  Procedures   MYOCARDIAL PERFUSION IMAGING   EKG 12-Lead   ECHOCARDIOGRAM COMPLETE   Meds ordered this encounter  Medications   nitroGLYCERIN (NITROSTAT) 0.4 MG SL tablet    Sig: Place 1 tablet (0.4 mg total) under the tongue every 5 (five) minutes as needed for chest pain.    Dispense:  25 tablet    Refill:  3    Patient Instructions  Medication Instructions:  Your physician has recommended you make the following change in your medication:  START: Nitroglycerin 0.4 mg under your tongue every 5 min up to 3 for Chest Pain  If you have chest pain place 1 tablet under your tongue, wait 5 min If chest pain continues place a 2nd tablet under your tongue, wait 5 min If chest pain continues place a 3rd tablet under your tongue, wait 5 min If chest pain continues call 911  *If you need a refill on your cardiac medications before your next appointment, please call your  pharmacy*   Lab  Work: NONE  If you have labs (blood work) drawn today and your tests are completely normal, you will receive your results only by: [place] (if you have MyChart) OR A paper copy in the mail If you have any lab test that is abnormal or we need to change your treatment, we will call you to review the results.   Testing/Procedures: Your physician has requested that you have an echocardiogram. Echocardiography is a painless test that uses sound waves to create images of your heart. It provides your doctor with information about the size and shape of your heart and how well your heart's chambers and valves are working. This procedure takes approximately one hour. There are no restrictions for this procedure. Please do NOT wear cologne, perfume, aftershave, or lotions (deodorant is allowed). Please arrive 15 minutes prior to your appointment time.  Your physician has requested that you have a lexiscan myoview. For further information please visit [email or web address]. Please follow instruction sheet, as given.   You are scheduled for a Myocardial Perfusion Imaging Study. Please arrive 15 minutes prior to your appointment time for registration and insurance purposes.   The test will take approximately 3 to 4 hours to complete; you may bring reading material.  If someone comes with you to your appointment, they will need to remain in the main lobby due to limited space in the testing area. **If you are pregnant or breastfeeding, please notify the nuclear lab prior to your appointment**   How to prepare for your Myocardial Perfusion Test: Do not eat or drink 3 hours prior to your test, except you may have water. Do not consume products containing caffeine (regular or decaffeinated) 12 hours prior to your test. (ex: coffee, chocolate, sodas, tea). Do bring a list of your current medications with you.  If not listed below, you may take your medications as normal. Do wear comfortable clothes (no  dresses or overalls) and walking shoes, tennis shoes preferred (No heels or open toe shoes are allowed). Do NOT wear cologne, perfume, aftershave, or lotions (deodorant is allowed). If these instructions are not followed, your test will have to be rescheduled.  If you cannot keep your appointment, please provide 24 hours notification to the Nuclear Lab, to avoid a possible $50 charge to your account.       Follow-Up: At [facility], you and your health needs are our priority.  As part of our continuing mission to provide you with exceptional heart care, we have created designated Provider Care Teams.  These Care Teams include your primary Cardiologist (physician) and Advanced Practice Providers (APPs -  Physician Assistants and Nurse Practitioners) who all work together to provide you with the care you need, when you need it.   Your next appointment:   2-3 month(s)  Provider:   Candee Furbish, MD       Signed, Werner Lean, MD  09/17/2022 3:24 PM    [place]

## 2022-09-17 NOTE — Patient Instructions (Signed)
Medication Instructions:  Your physician has recommended you make the following change in your medication:  START: Nitroglycerin 0.4 mg under your tongue every 5 min up to 3 for Chest Pain  If you have chest pain place 1 tablet under your tongue, wait 5 min If chest pain continues place a 2nd tablet under your tongue, wait 5 min If chest pain continues place a 3rd tablet under your tongue, wait 5 min If chest pain continues call 911  *If you need a refill on your cardiac medications before your next appointment, please call your pharmacy*   Lab Work: NONE  If you have labs (blood work) drawn today and your tests are completely normal, you will receive your results only by: [place] (if you have MyChart) OR A paper copy in the mail If you have any lab test that is abnormal or we need to change your treatment, we will call you to review the results.   Testing/Procedures: Your physician has requested that you have an echocardiogram. Echocardiography is a painless test that uses sound waves to create images of your heart. It provides your doctor with information about the size and shape of your heart and how well your heart's chambers and valves are working. This procedure takes approximately one hour. There are no restrictions for this procedure. Please do NOT wear cologne, perfume, aftershave, or lotions (deodorant is allowed). Please arrive 15 minutes prior to your appointment time.  Your physician has requested that you have a lexiscan myoview. For further information please visit [email or web address]. Please follow instruction sheet, as given.   You are scheduled for a Myocardial Perfusion Imaging Study. Please arrive 15 minutes prior to your appointment time for registration and insurance purposes.   The test will take approximately 3 to 4 hours to complete; you may bring reading material.  If someone comes with you to your appointment, they will need to remain in the main lobby  due to limited space in the testing area. **If you are pregnant or breastfeeding, please notify the nuclear lab prior to your appointment**   How to prepare for your Myocardial Perfusion Test: Do not eat or drink 3 hours prior to your test, except you may have water. Do not consume products containing caffeine (regular or decaffeinated) 12 hours prior to your test. (ex: coffee, chocolate, sodas, tea). Do bring a list of your current medications with you.  If not listed below, you may take your medications as normal. Do wear comfortable clothes (no dresses or overalls) and walking shoes, tennis shoes preferred (No heels or open toe shoes are allowed). Do NOT wear cologne, perfume, aftershave, or lotions (deodorant is allowed). If these instructions are not followed, your test will have to be rescheduled.  If you cannot keep your appointment, please provide 24 hours notification to the Nuclear Lab, to avoid a possible $50 charge to your account.       Follow-Up: At [facility], you and your health needs are our priority.  As part of our continuing mission to provide you with exceptional heart care, we have created designated Provider Care Teams.  These Care Teams include your primary Cardiologist (physician) and Advanced Practice Providers (APPs -  Physician Assistants and Nurse Practitioners) who all work together to provide you with the care you need, when you need it.   Your next appointment:   2-3 month(s)  Provider:   Candee Furbish, MD

## 2022-09-18 ENCOUNTER — Other Ambulatory Visit: Payer: Self-pay | Admitting: Internal Medicine

## 2022-09-18 DIAGNOSIS — I25118 Atherosclerotic heart disease of native coronary artery with other forms of angina pectoris: Secondary | ICD-10-CM

## 2022-09-21 ENCOUNTER — Telehealth: Payer: Self-pay

## 2022-09-21 MED ORDER — OLMESARTAN MEDOXOMIL 20 MG PO TABS: 20.0000 mg | ORAL_TABLET | Freq: Every day | ORAL | 3 refills | Status: DC

## 2022-09-21 NOTE — Telephone Encounter (Signed)
Pt reports the following BP readings:  09/19/22 @ 12:07 pm- 168/96-106 09/20/22@ 8:15 am- [id number]  @5 :14 pm-173/81-73 09/21/22 @ 8:05 am- 177/115-76  2 hours later-182/96-112   Spoke with Dr. Gasper Sells advised that pt increase olmesartan to 20 mg by mouth once daily.  Continue to monitor BP.

## 2022-09-22 ENCOUNTER — Telehealth (HOSPITAL_COMMUNITY): Payer: Self-pay | Admitting: *Deleted

## 2022-09-22 NOTE — Telephone Encounter (Signed)
Spoke with patient and she was given detailed instructions about her test on tomorrow 09/23/22.

## 2022-09-23 ENCOUNTER — Ambulatory Visit (HOSPITAL_COMMUNITY): Payer: Medicare PPO | Attending: Cardiology

## 2022-09-23 DIAGNOSIS — I493 Ventricular premature depolarization: Secondary | ICD-10-CM | POA: Insufficient documentation

## 2022-09-23 DIAGNOSIS — I77810 Thoracic aortic ectasia: Secondary | ICD-10-CM | POA: Diagnosis not present

## 2022-09-23 LAB — MYOCARDIAL PERFUSION IMAGING
LV dias vol: 62 mL (ref 46–106)
LV sys vol: 27 mL
Nuc Stress EF: 56 %
Peak HR: 110 {beats}/min
Rest HR: 77 {beats}/min
Rest Nuclear Isotope Dose: 10.9 mCi
SDS: 0
SRS: 0
SSS: 0
ST Depression (mm): 0 mm
Stress Nuclear Isotope Dose: 32.2 mCi
TID: 1.07

## 2022-09-23 MED ORDER — TECHNETIUM TC 99M TETROFOSMIN IV KIT
10.9000 | PACK | Freq: Once | INTRAVENOUS | Status: AC | PRN
  Administered 2022-09-23: 10.9 via INTRAVENOUS

## 2022-09-23 MED ORDER — AMINOPHYLLINE 25 MG/ML IV SOLN
75.0000 mg | Freq: Once | INTRAVENOUS | Status: AC
  Administered 2022-09-23: 75 mg via INTRAVENOUS

## 2022-09-23 MED ORDER — REGADENOSON 0.4 MG/5ML IV SOLN
0.4000 mg | Freq: Once | INTRAVENOUS | Status: AC
  Administered 2022-09-23: 0.4 mg via INTRAVENOUS

## 2022-09-23 MED ORDER — TECHNETIUM TC 99M TETROFOSMIN IV KIT
32.2000 | PACK | Freq: Once | INTRAVENOUS | Status: AC | PRN
  Administered 2022-09-23: 32.2 via INTRAVENOUS

## 2022-09-24 DIAGNOSIS — M25539 Pain in unspecified wrist: Secondary | ICD-10-CM | POA: Diagnosis not present

## 2022-09-25 ENCOUNTER — Ambulatory Visit (HOSPITAL_COMMUNITY): Payer: Medicare PPO

## 2022-09-28 DIAGNOSIS — H401134 Primary open-angle glaucoma, bilateral, indeterminate stage: Secondary | ICD-10-CM | POA: Diagnosis not present

## 2022-10-07 ENCOUNTER — Ambulatory Visit (HOSPITAL_COMMUNITY): Payer: Medicare PPO | Attending: Internal Medicine

## 2022-10-07 ENCOUNTER — Telehealth: Payer: Self-pay

## 2022-10-07 DIAGNOSIS — I77810 Thoracic aortic ectasia: Secondary | ICD-10-CM | POA: Insufficient documentation

## 2022-10-07 DIAGNOSIS — E785 Hyperlipidemia, unspecified: Secondary | ICD-10-CM | POA: Diagnosis not present

## 2022-10-07 DIAGNOSIS — I10 Essential (primary) hypertension: Secondary | ICD-10-CM | POA: Insufficient documentation

## 2022-10-07 DIAGNOSIS — I351 Nonrheumatic aortic (valve) insufficiency: Secondary | ICD-10-CM

## 2022-10-07 DIAGNOSIS — I493 Ventricular premature depolarization: Secondary | ICD-10-CM | POA: Diagnosis not present

## 2022-10-07 DIAGNOSIS — I2510 Atherosclerotic heart disease of native coronary artery without angina pectoris: Secondary | ICD-10-CM | POA: Insufficient documentation

## 2022-10-07 LAB — ECHOCARDIOGRAM COMPLETE
Area-P 1/2: 3.68 cm2
P 1/2 time: 177 msec
S' Lateral: 2.5 cm

## 2022-10-07 MED ORDER — OLMESARTAN MEDOXOMIL 40 MG PO TABS: 40.0000 mg | ORAL_TABLET | Freq: Every day | ORAL | 3 refills | Status: DC

## 2022-10-07 NOTE — Telephone Encounter (Signed)
The patient has been notified of the result and verbalized understanding.  All questions (if any) were answered. Macie Burows, RN 10/07/2022 5:14 PM

## 2022-10-07 NOTE — Telephone Encounter (Signed)
-----   Message from Christell Constant, MD sent at 10/07/2022  4:25 PM EDT ----- Results: Mild AI Mild aortic dilation, with BSA of only 1.5 BP still severely elevated Plan: Increase from Olmesartan to 40 mg, needs lab follow up and follow up with team  Christell Constant, MD

## 2022-10-12 ENCOUNTER — Ambulatory Visit: Payer: Medicare PPO | Attending: Cardiology | Admitting: Cardiology

## 2022-10-12 ENCOUNTER — Encounter: Payer: Self-pay | Admitting: Cardiology

## 2022-10-12 VITALS — BP 176/92 | HR 89 | Ht 63.0 in | Wt 113.0 lb

## 2022-10-12 DIAGNOSIS — I493 Ventricular premature depolarization: Secondary | ICD-10-CM

## 2022-10-12 DIAGNOSIS — Z79899 Other long term (current) drug therapy: Secondary | ICD-10-CM

## 2022-10-12 DIAGNOSIS — I10 Essential (primary) hypertension: Secondary | ICD-10-CM
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
## 2022-10-12 NOTE — Patient Instructions (Signed)
Medication Instructions:  The current medical regimen is effective;  continue present plan and medications.  *If you need a refill on your cardiac medications before your next appointment, please call your pharmacy*  Lab Work: Please have blood work today (BMP, TSH, Mg)  If you have labs (blood work) drawn today and your tests are completely normal, you will receive your results only by: MyChart Message (if you have MyChart) OR A paper copy in the mail If you have any lab test that is abnormal or we need to change your treatment, we will call you to review the results.  Follow-Up: At [facility], you and your health needs are our priority.  As part of our continuing mission to provide you with exceptional heart care, we have created designated Provider Care Teams.  These Care Teams include your primary Cardiologist (physician) and Advanced Practice Providers (APPs -  Physician Assistants and Nurse Practitioners) who all work together to provide you with the care you need, when you need it.  We recommend signing up for the patient portal called "MyChart".  Sign up information is provided on this After Visit Summary.  MyChart is used to connect with patients for Virtual Visits (Telemedicine).  Patients are able to view lab/test results, encounter notes, upcoming appointments, etc.  Non-urgent messages can be sent to your provider as well.   To learn more about what you can do with MyChart, go to [email or web address].    Your next appointment:   6 month(s)  Provider:   Jari Favre, PA-C, Robin Searing, NP, Jacolyn Reedy, PA-C, Eligha Bridegroom, NP, or Tereso Newcomer, PA-C      Then, Donato Schultz, MD will plan to see you again in 1 year(s).

## 2022-10-13 LAB — BASIC METABOLIC PANEL
BUN/Creatinine Ratio: 14 (ref 12–28)
BUN: 13 mg/dL (ref 8–27)
CO2: 24 mmol/L (ref 20–29)
Calcium: 9.8 mg/dL (ref 8.7–10.3)
Chloride: 100 mmol/L (ref 96–106)
Creatinine, Ser: 0.91 mg/dL (ref 0.57–1.00)
Glucose: 96 mg/dL (ref 70–99)
Potassium: 4.2 mmol/L (ref 3.5–5.2)
Sodium: 140 mmol/L (ref 134–144)
eGFR: 62 mL/min/{1.73_m2} (ref >59)

## 2022-10-13 LAB — TSH: TSH: 2.99 u[IU]/mL (ref 0.450–4.500)

## 2022-10-13 LAB — MAGNESIUM: Magnesium: 2 mg/dL (ref 1.6–2.3)

## 2022-10-15 DIAGNOSIS — M25532 Pain in left wrist: Secondary | ICD-10-CM | POA: Diagnosis not present

## 2022-10-15 DIAGNOSIS — M25539 Pain in unspecified wrist: Secondary | ICD-10-CM | POA: Diagnosis not present

## 2022-10-19 DIAGNOSIS — Z1283 Encounter for screening for malignant neoplasm of skin: Secondary | ICD-10-CM | POA: Diagnosis not present

## 2022-10-19 DIAGNOSIS — F411 Generalized anxiety disorder: Secondary | ICD-10-CM | POA: Diagnosis not present

## 2022-10-19 DIAGNOSIS — R030 Elevated blood-pressure reading, without diagnosis of hypertension: Secondary | ICD-10-CM | POA: Diagnosis not present

## 2022-10-19 DIAGNOSIS — Z6820 Body mass index (BMI) 20.0-20.9, adult: Secondary | ICD-10-CM | POA: Diagnosis not present

## 2022-10-19 DIAGNOSIS — D239 Other benign neoplasm of skin, unspecified: Secondary | ICD-10-CM | POA: Diagnosis not present

## 2022-10-19 DIAGNOSIS — F332 Major depressive disorder, recurrent severe without psychotic features: Secondary | ICD-10-CM | POA: Diagnosis not present

## 2022-10-19 DIAGNOSIS — R2689 Other abnormalities of gait and mobility: Secondary | ICD-10-CM | POA: Diagnosis not present

## 2022-10-19 DIAGNOSIS — R079 Chest pain, unspecified: Secondary | ICD-10-CM | POA: Diagnosis not present

## 2022-10-23 DIAGNOSIS — K219 Gastro-esophageal reflux disease without esophagitis: Secondary | ICD-10-CM | POA: Diagnosis not present

## 2022-10-23 DIAGNOSIS — Z681 Body mass index (BMI) 19 or less, adult: Secondary | ICD-10-CM | POA: Diagnosis not present

## 2022-10-23 DIAGNOSIS — R030 Elevated blood-pressure reading, without diagnosis of hypertension: Secondary | ICD-10-CM | POA: Diagnosis not present

## 2022-10-23 DIAGNOSIS — R110 Nausea: Secondary | ICD-10-CM | POA: Diagnosis not present

## 2022-10-28 DIAGNOSIS — R1084 Generalized abdominal pain: Secondary | ICD-10-CM | POA: Diagnosis not present

## 2022-10-28 DIAGNOSIS — M6281 Muscle weakness (generalized): Secondary | ICD-10-CM | POA: Diagnosis not present

## 2022-10-28 DIAGNOSIS — R140 Abdominal distension (gaseous): Secondary | ICD-10-CM | POA: Diagnosis not present

## 2022-11-03 DIAGNOSIS — M6281 Muscle weakness (generalized): Secondary | ICD-10-CM | POA: Diagnosis not present

## 2022-11-05 DIAGNOSIS — M6281 Muscle weakness (generalized): Secondary | ICD-10-CM | POA: Diagnosis not present

## 2022-11-10 DIAGNOSIS — M6281 Muscle weakness (generalized): Secondary | ICD-10-CM | POA: Diagnosis not present

## 2022-11-12 DIAGNOSIS — M6281 Muscle weakness (generalized): Secondary | ICD-10-CM | POA: Diagnosis not present

## 2022-11-17 DIAGNOSIS — M6281 Muscle weakness (generalized): Secondary | ICD-10-CM | POA: Diagnosis not present

## 2022-11-24 DIAGNOSIS — M6281 Muscle weakness (generalized): Secondary | ICD-10-CM | POA: Diagnosis not present

## 2022-11-26 DIAGNOSIS — M6281 Muscle weakness (generalized): Secondary | ICD-10-CM | POA: Diagnosis not present

## 2022-11-26 DIAGNOSIS — R262 Difficulty in walking, not elsewhere classified: Secondary | ICD-10-CM | POA: Diagnosis not present

## 2023-01-07 ENCOUNTER — Encounter (INDEPENDENT_AMBULATORY_CARE_PROVIDER_SITE_OTHER): Payer: Medicare PPO | Admitting: Cardiology

## 2023-01-07 DIAGNOSIS — I10 Essential (primary) hypertension: Secondary | ICD-10-CM

## 2023-01-11 DIAGNOSIS — H401132 Primary open-angle glaucoma, bilateral, moderate stage: Secondary | ICD-10-CM | POA: Diagnosis not present

## 2023-01-11 MED ORDER — OLMESARTAN MEDOXOMIL 20 MG PO TABS: 20.0000 mg | ORAL_TABLET | Freq: Every day | ORAL | 1 refills | Status: DC

## 2023-01-11 NOTE — Telephone Encounter (Signed)
Pt made aware via mychart message that per Dr. Anne Fu, she should decrease her olmesartan from 40 mg to 20 mg po daily.  Confirmed the pharmacy of choice with the pt via mychart.   Dose decrease sent to her confirmed pharmacy of choice.  Pt verbalized understanding and agrees with this plan via mychart message.

## 2023-01-11 NOTE — Telephone Encounter (Signed)
Please see the MyChart message reply(ies) for my assessment and plan.   Appreciate the blood pressure readings.  I would be comfortable with you taking the olmesartan 20mg  instead of 40mg  Let's continue to monitor blood pressure.  This patient gave consent for this Medical Advice Message and is aware that it may result in a bill to [employer], as well as the possibility of receiving a bill for a co-payment or deductible. They are an established patient, but are not seeking medical advice exclusively about a problem treated during an in person or video visit in the last seven days. I did not recommend an in person or video visit within seven days of my reply.    I spent a total of 6 minutes cumulative time within 7 days through [employer].  Donato Schultz, MD

## 2023-01-11 NOTE — Addendum Note (Signed)
Addended by: Loa Socks on: 01/11/2023 11:40 AM   Modules accepted: Orders

## 2023-02-18 DIAGNOSIS — E559 Vitamin D deficiency, unspecified: Secondary | ICD-10-CM | POA: Diagnosis not present

## 2023-02-18 DIAGNOSIS — R7989 Other specified abnormal findings of blood chemistry: Secondary | ICD-10-CM | POA: Diagnosis not present

## 2023-02-18 DIAGNOSIS — E7849 Other hyperlipidemia: Secondary | ICD-10-CM | POA: Diagnosis not present

## 2023-02-18 DIAGNOSIS — E7801 Familial hypercholesterolemia: Secondary | ICD-10-CM | POA: Diagnosis not present

## 2023-02-19 ENCOUNTER — Encounter (HOSPITAL_COMMUNITY)
Admission: EM | Disposition: A | Discharge status: Home / Self Care | Payer: Self-pay | Source: Home / Self Care | Attending: Family Medicine

## 2023-02-19 ENCOUNTER — Encounter (HOSPITAL_COMMUNITY): Payer: Self-pay | Admitting: Emergency Medicine

## 2023-02-19 ENCOUNTER — Inpatient Hospital Stay (HOSPITAL_COMMUNITY)
Admission: EM | Admit: 2023-02-19 | Discharge: 2023-02-20 | DRG: 322 | Disposition: A | Discharge status: Home / Self Care | Payer: Medicare PPO | Attending: Family Medicine | Admitting: Family Medicine

## 2023-02-19 ENCOUNTER — Other Ambulatory Visit: Payer: Self-pay

## 2023-02-19 ENCOUNTER — Emergency Department (HOSPITAL_COMMUNITY): Payer: Medicare PPO

## 2023-02-19 DIAGNOSIS — I739 Peripheral vascular disease, unspecified: Secondary | ICD-10-CM | POA: Diagnosis present

## 2023-02-19 DIAGNOSIS — I25110 Atherosclerotic heart disease of native coronary artery with unstable angina pectoris: Secondary | ICD-10-CM | POA: Diagnosis not present

## 2023-02-19 DIAGNOSIS — Z8249 Family history of ischemic heart disease and other diseases of the circulatory system: Secondary | ICD-10-CM | POA: Diagnosis not present

## 2023-02-19 DIAGNOSIS — Z888 Allergy status to other drugs, medicaments and biological substances status: Secondary | ICD-10-CM

## 2023-02-19 DIAGNOSIS — I214 Non-ST elevation (NSTEMI) myocardial infarction: Secondary | ICD-10-CM | POA: Diagnosis present

## 2023-02-19 DIAGNOSIS — I2584 Coronary atherosclerosis due to calcified coronary lesion: Secondary | ICD-10-CM | POA: Diagnosis present

## 2023-02-19 DIAGNOSIS — Z791 Long term (current) use of non-steroidal anti-inflammatories (NSAID): Secondary | ICD-10-CM | POA: Diagnosis not present

## 2023-02-19 DIAGNOSIS — E782 Mixed hyperlipidemia: Secondary | ICD-10-CM | POA: Diagnosis present

## 2023-02-19 DIAGNOSIS — R011 Cardiac murmur, unspecified: Secondary | ICD-10-CM | POA: Diagnosis present

## 2023-02-19 DIAGNOSIS — I7121 Aneurysm of the ascending aorta, without rupture: Secondary | ICD-10-CM | POA: Diagnosis present

## 2023-02-19 DIAGNOSIS — Z885 Allergy status to narcotic agent status: Secondary | ICD-10-CM

## 2023-02-19 DIAGNOSIS — Z8679 Personal history of other diseases of the circulatory system: Secondary | ICD-10-CM | POA: Diagnosis not present

## 2023-02-19 DIAGNOSIS — E785 Hyperlipidemia, unspecified: Secondary | ICD-10-CM | POA: Diagnosis not present

## 2023-02-19 DIAGNOSIS — I700 Atherosclerosis of aorta: Secondary | ICD-10-CM | POA: Diagnosis present

## 2023-02-19 DIAGNOSIS — I2510 Atherosclerotic heart disease of native coronary artery without angina pectoris: Secondary | ICD-10-CM | POA: Diagnosis present

## 2023-02-19 DIAGNOSIS — Z8719 Personal history of other diseases of the digestive system: Secondary | ICD-10-CM | POA: Diagnosis not present

## 2023-02-19 DIAGNOSIS — R079 Chest pain, unspecified: Secondary | ICD-10-CM | POA: Diagnosis present

## 2023-02-19 DIAGNOSIS — R072 Precordial pain: Secondary | ICD-10-CM | POA: Diagnosis not present

## 2023-02-19 DIAGNOSIS — J9811 Atelectasis: Secondary | ICD-10-CM | POA: Diagnosis not present

## 2023-02-19 DIAGNOSIS — Z9889 Other specified postprocedural states: Secondary | ICD-10-CM

## 2023-02-19 DIAGNOSIS — Z87891 Personal history of nicotine dependence: Secondary | ICD-10-CM | POA: Diagnosis not present

## 2023-02-19 DIAGNOSIS — K219 Gastro-esophageal reflux disease without esophagitis: Secondary | ICD-10-CM | POA: Diagnosis present

## 2023-02-19 DIAGNOSIS — I10 Essential (primary) hypertension: Secondary | ICD-10-CM | POA: Diagnosis present

## 2023-02-19 DIAGNOSIS — Z9851 Tubal ligation status: Secondary | ICD-10-CM | POA: Diagnosis not present

## 2023-02-19 DIAGNOSIS — Z880 Allergy status to penicillin: Secondary | ICD-10-CM | POA: Diagnosis not present

## 2023-02-19 DIAGNOSIS — J42 Unspecified chronic bronchitis: Secondary | ICD-10-CM | POA: Diagnosis not present

## 2023-02-19 DIAGNOSIS — I493 Ventricular premature depolarization: Secondary | ICD-10-CM | POA: Diagnosis present

## 2023-02-19 DIAGNOSIS — Z881 Allergy status to other antibiotic agents status: Secondary | ICD-10-CM

## 2023-02-19 DIAGNOSIS — Z7982 Long term (current) use of aspirin: Secondary | ICD-10-CM

## 2023-02-19 DIAGNOSIS — I200 Unstable angina: Principal | ICD-10-CM

## 2023-02-19 DIAGNOSIS — Z87448 Personal history of other diseases of urinary system: Secondary | ICD-10-CM

## 2023-02-19 DIAGNOSIS — Z79899 Other long term (current) drug therapy: Secondary | ICD-10-CM | POA: Diagnosis not present

## 2023-02-19 HISTORY — PX: LEFT HEART CATH AND CORONARY ANGIOGRAPHY: CATH118249

## 2023-02-19 HISTORY — PX: CORONARY STENT INTERVENTION: CATH118234

## 2023-02-19 LAB — COMPREHENSIVE METABOLIC PANEL
ALT: 15 U/L (ref 0–44)
AST: 18 U/L (ref 15–41)
Albumin: 4.3 g/dL (ref 3.5–5.0)
Alkaline Phosphatase: 116 U/L (ref 38–126)
Anion gap: 6 (ref 5–15)
BUN: 18 mg/dL (ref 8–23)
CO2: 26 mmol/L (ref 22–32)
Calcium: 9.4 mg/dL (ref 8.9–10.3)
Chloride: 103 mmol/L (ref 98–111)
Creatinine, Ser: 0.92 mg/dL (ref 0.44–1.00)
GFR, Estimated: >60 mL/min (ref >60)
Glucose, Bld: 105 mg/dL — ABNORMAL HIGH (ref 70–99)
Potassium: 3.7 mmol/L (ref 3.5–5.1)
Sodium: 135 mmol/L (ref 135–145)
Total Bilirubin: 0.5 mg/dL (ref 0.3–1.2)
Total Protein: 7 g/dL (ref 6.5–8.1)

## 2023-02-19 LAB — CBC WITH DIFFERENTIAL/PLATELET
Abs Immature Granulocytes: 0.02 10*3/uL (ref 0.00–0.07)
Basophils Absolute: 0.1 10*3/uL (ref 0.0–0.1)
Basophils Relative: 1 %
Eosinophils Absolute: 0.2 10*3/uL (ref 0.0–0.5)
Eosinophils Relative: 3 %
HCT: 41.4 % (ref 36.0–46.0)
Hemoglobin: 13.5 g/dL (ref 12.0–15.0)
Immature Granulocytes: 0 %
Lymphocytes Relative: 33 %
Lymphs Abs: 1.9 10*3/uL (ref 0.7–4.0)
MCH: 31.7 pg (ref 26.0–34.0)
MCHC: 32.6 g/dL (ref 30.0–36.0)
MCV: 97.2 fL (ref 80.0–100.0)
Monocytes Absolute: 0.7 10*3/uL (ref 0.1–1.0)
Monocytes Relative: 12 %
Neutro Abs: 3 10*3/uL (ref 1.7–7.7)
Neutrophils Relative %: 51 %
Platelets: 244 10*3/uL (ref 150–400)
RBC: 4.26 MIL/uL (ref 3.87–5.11)
RDW: 13.1 % (ref 11.5–15.5)
WBC: 5.8 10*3/uL (ref 4.0–10.5)
nRBC: 0 % (ref 0.0–0.2)

## 2023-02-19 LAB — CREATININE, SERUM
Creatinine, Ser: 0.79 mg/dL (ref 0.44–1.00)
GFR, Estimated: >60 mL/min (ref >60)

## 2023-02-19 LAB — POCT ACTIVATED CLOTTING TIME
Activated Clotting Time: 238 seconds
Activated Clotting Time: 360 seconds

## 2023-02-19 LAB — TROPONIN I (HIGH SENSITIVITY)
Troponin I (High Sensitivity): 42 ng/L — ABNORMAL HIGH (ref <18)
Troponin I (High Sensitivity): 46 ng/L — ABNORMAL HIGH (ref <18)
Troponin I (High Sensitivity): 49 ng/L — ABNORMAL HIGH (ref <18)
Troponin I (High Sensitivity): 50 ng/L — ABNORMAL HIGH (ref <18)

## 2023-02-19 LAB — PHOSPHORUS: Phosphorus: 3.4 mg/dL (ref 2.5–4.6)

## 2023-02-19 LAB — MAGNESIUM: Magnesium: 2.1 mg/dL (ref 1.7–2.4)

## 2023-02-19 LAB — HEPARIN LEVEL (UNFRACTIONATED): Heparin Unfractionated: 0.48 [IU]/mL (ref 0.30–0.70)

## 2023-02-19 SURGERY — LEFT HEART CATH AND CORONARY ANGIOGRAPHY
Anesthesia: LOCAL

## 2023-02-19 MED ORDER — ASPIRIN 81 MG PO TBEC
81.0000 mg | DELAYED_RELEASE_TABLET | Freq: Every morning | ORAL | Status: DC
  Administered 2023-02-19 – 2023-02-20 (×2): 81 mg via ORAL
  Filled 2023-02-19 (×2): qty 1

## 2023-02-19 MED ORDER — METOPROLOL SUCCINATE ER 50 MG PO TB24: 50.0000 mg | ORAL_TABLET | Freq: Every day | ORAL | Status: DC

## 2023-02-19 MED ORDER — PANTOPRAZOLE SODIUM 40 MG PO TBEC
40.0000 mg | DELAYED_RELEASE_TABLET | Freq: Every day | ORAL | Status: DC
  Administered 2023-02-19 – 2023-02-20 (×2): 40 mg via ORAL
  Filled 2023-02-19 (×2): qty 1

## 2023-02-19 MED ORDER — NITROGLYCERIN 2 % TD OINT
1.0000 [in_us] | TOPICAL_OINTMENT | Freq: Once | TRANSDERMAL | Status: AC
  Administered 2023-02-19: 1 [in_us] via TOPICAL
  Filled 2023-02-19: qty 1

## 2023-02-19 MED ORDER — ASPIRIN 81 MG PO CHEW
324.0000 mg | CHEWABLE_TABLET | Freq: Once | ORAL | Status: AC
  Administered 2023-02-19: 324 mg via ORAL
  Filled 2023-02-19: qty 4

## 2023-02-19 MED ORDER — SODIUM CHLORIDE 0.9 % WEIGHT BASED INFUSION: 3.0000 mL/kg/h | INTRAVENOUS | Status: DC

## 2023-02-19 MED ORDER — TICAGRELOR 90 MG PO TABS
ORAL_TABLET | ORAL | Status: AC
  Filled 2023-02-19: qty 1

## 2023-02-19 MED ORDER — FENTANYL CITRATE (PF) 100 MCG/2ML IJ SOLN
INTRAMUSCULAR | Status: AC
  Filled 2023-02-19: qty 2

## 2023-02-19 MED ORDER — IOHEXOL 350 MG/ML SOLN
INTRAVENOUS | Status: DC | PRN
  Administered 2023-02-19: 190 mL

## 2023-02-19 MED ORDER — TICAGRELOR 90 MG PO TABS
90.0000 mg | ORAL_TABLET | Freq: Two times a day (BID) | ORAL | Status: DC
  Administered 2023-02-20: 90 mg via ORAL
  Filled 2023-02-19: qty 1

## 2023-02-19 MED ORDER — HEPARIN SODIUM (PORCINE) 1000 UNIT/ML IJ SOLN
INTRAMUSCULAR | Status: AC
  Filled 2023-02-19: qty 10

## 2023-02-19 MED ORDER — LIDOCAINE HCL (PF) 1 % IJ SOLN
INTRAMUSCULAR | Status: AC
  Filled 2023-02-19: qty 30

## 2023-02-19 MED ORDER — ROSUVASTATIN CALCIUM 5 MG PO TABS
5.0000 mg | ORAL_TABLET | ORAL | Status: DC
  Administered 2023-02-19: 5 mg via ORAL
  Filled 2023-02-19: qty 1

## 2023-02-19 MED ORDER — SODIUM CHLORIDE 0.9 % IV SOLN: 250.0000 mL | INTRAVENOUS | Status: DC | PRN

## 2023-02-19 MED ORDER — VERAPAMIL HCL 2.5 MG/ML IV SOLN
INTRAVENOUS | Status: AC
  Filled 2023-02-19: qty 2

## 2023-02-19 MED ORDER — LABETALOL HCL 5 MG/ML IV SOLN
10.0000 mg | INTRAVENOUS | Status: AC | PRN
  Administered 2023-02-19: 10 mg via INTRAVENOUS
  Filled 2023-02-19: qty 4

## 2023-02-19 MED ORDER — SODIUM CHLORIDE 0.9% FLUSH: 3.0000 mL | INTRAVENOUS | Status: DC | PRN

## 2023-02-19 MED ORDER — HYDRALAZINE HCL 20 MG/ML IJ SOLN: 10.0000 mg | Freq: Four times a day (QID) | INTRAMUSCULAR | Status: DC | PRN

## 2023-02-19 MED ORDER — MIDAZOLAM HCL 2 MG/2ML IJ SOLN
INTRAMUSCULAR | Status: AC
  Filled 2023-02-19: qty 2

## 2023-02-19 MED ORDER — SODIUM CHLORIDE 0.9 % WEIGHT BASED INFUSION
1.0000 mL/kg/h | INTRAVENOUS | Status: DC
  Administered 2023-02-19: 1 mL/kg/h via INTRAVENOUS

## 2023-02-19 MED ORDER — ACETAMINOPHEN 325 MG PO TABS
650.0000 mg | ORAL_TABLET | ORAL | Status: DC | PRN
  Administered 2023-02-20: 650 mg via ORAL
  Filled 2023-02-19: qty 2

## 2023-02-19 MED ORDER — IRBESARTAN 150 MG PO TABS
150.0000 mg | ORAL_TABLET | Freq: Every day | ORAL | Status: DC
  Administered 2023-02-20: 150 mg via ORAL
  Filled 2023-02-19 (×2): qty 1

## 2023-02-19 MED ORDER — EZETIMIBE 10 MG PO TABS
10.0000 mg | ORAL_TABLET | Freq: Every morning | ORAL | Status: DC
  Administered 2023-02-19 – 2023-02-20 (×2): 10 mg via ORAL
  Filled 2023-02-19 (×2): qty 1

## 2023-02-19 MED ORDER — HEPARIN SODIUM (PORCINE) 1000 UNIT/ML IJ SOLN
INTRAMUSCULAR | Status: DC | PRN
  Administered 2023-02-19 (×2): 3000 [IU] via INTRAVENOUS

## 2023-02-19 MED ORDER — ENOXAPARIN SODIUM 40 MG/0.4ML IJ SOSY
40.0000 mg | PREFILLED_SYRINGE | INTRAMUSCULAR | Status: DC
  Filled 2023-02-19: qty 0.4

## 2023-02-19 MED ORDER — VERAPAMIL HCL 2.5 MG/ML IV SOLN
INTRAVENOUS | Status: DC | PRN
  Administered 2023-02-19: 10 mL via INTRA_ARTERIAL

## 2023-02-19 MED ORDER — ACETAMINOPHEN 325 MG PO TABS: 650.0000 mg | ORAL_TABLET | Freq: Four times a day (QID) | ORAL | Status: DC | PRN

## 2023-02-19 MED ORDER — SODIUM CHLORIDE 0.9% FLUSH
3.0000 mL | Freq: Two times a day (BID) | INTRAVENOUS | Status: DC
  Administered 2023-02-20: 3 mL via INTRAVENOUS

## 2023-02-19 MED ORDER — SODIUM CHLORIDE 0.9 % WEIGHT BASED INFUSION
1.0000 mL/kg/h | INTRAVENOUS | Status: AC
  Administered 2023-02-19: 1 mL/kg/h via INTRAVENOUS

## 2023-02-19 MED ORDER — HEPARIN (PORCINE) IN NACL 1000-0.9 UT/500ML-% IV SOLN
INTRAVENOUS | Status: DC | PRN
  Administered 2023-02-19 (×2): 500 mL

## 2023-02-19 MED ORDER — HYDRALAZINE HCL 20 MG/ML IJ SOLN: 10.0000 mg | INTRAMUSCULAR | Status: AC | PRN

## 2023-02-19 MED ORDER — ONDANSETRON HCL 4 MG/2ML IJ SOLN
4.0000 mg | Freq: Four times a day (QID) | INTRAMUSCULAR | Status: DC | PRN
  Administered 2023-02-19: 4 mg via INTRAVENOUS
  Filled 2023-02-19: qty 2

## 2023-02-19 MED ORDER — FENTANYL CITRATE (PF) 100 MCG/2ML IJ SOLN
INTRAMUSCULAR | Status: DC | PRN
  Administered 2023-02-19 (×3): 25 ug via INTRAVENOUS

## 2023-02-19 MED ORDER — GABAPENTIN 300 MG PO CAPS
300.0000 mg | ORAL_CAPSULE | Freq: Every day | ORAL | Status: DC
  Administered 2023-02-20: 300 mg via ORAL
  Filled 2023-02-19: qty 1

## 2023-02-19 MED ORDER — TICAGRELOR 90 MG PO TABS
ORAL_TABLET | ORAL | Status: DC | PRN
  Administered 2023-02-19: 180 mg via ORAL

## 2023-02-19 MED ORDER — ACETAMINOPHEN 650 MG RE SUPP: 650.0000 mg | Freq: Four times a day (QID) | RECTAL | Status: DC | PRN

## 2023-02-19 MED ORDER — LIDOCAINE HCL (PF) 1 % IJ SOLN
INTRAMUSCULAR | Status: DC | PRN
  Administered 2023-02-19: 5 mL via INTRADERMAL

## 2023-02-19 MED ORDER — NITROGLYCERIN 1 MG/10 ML FOR IR/CATH LAB
INTRA_ARTERIAL | Status: AC
  Filled 2023-02-19: qty 10

## 2023-02-19 MED ORDER — HEPARIN BOLUS VIA INFUSION
3000.0000 [IU] | Freq: Once | INTRAVENOUS | Status: AC
  Administered 2023-02-19: 3000 [IU] via INTRAVENOUS

## 2023-02-19 MED ORDER — NITROGLYCERIN 1 MG/10 ML FOR IR/CATH LAB
INTRA_ARTERIAL | Status: DC | PRN
  Administered 2023-02-19: 200 ug via INTRACORONARY

## 2023-02-19 MED ORDER — METOPROLOL SUCCINATE ER 50 MG PO TB24: 50.0000 mg | ORAL_TABLET | Freq: Every morning | ORAL | Status: DC

## 2023-02-19 MED ORDER — HEPARIN (PORCINE) 25000 UT/250ML-% IV SOLN
600.0000 [IU]/h | INTRAVENOUS | Status: DC
  Administered 2023-02-19: 600 [IU]/h via INTRAVENOUS
  Filled 2023-02-19: qty 250

## 2023-02-19 MED ORDER — MIDAZOLAM HCL 2 MG/2ML IJ SOLN
INTRAMUSCULAR | Status: DC | PRN
  Administered 2023-02-19 (×2): 1 mg via INTRAVENOUS

## 2023-02-19 SURGICAL SUPPLY — 26 items
BALLN EMERGE MR 2.0X12 (BALLOONS) ×1
BALLN ~~LOC~~ EMERGE MR 2.5X8 (BALLOONS) ×1
BALLOON EMERGE MR 2.0X12 (BALLOONS) IMPLANT
BALLOON ~~LOC~~ EMERGE MR 2.5X8 (BALLOONS) IMPLANT
CATH 5FR JL3.5 JR4 ANG PIG MP (CATHETERS) IMPLANT
CATH INFINITI 5FR AL1 (CATHETERS) IMPLANT
CATH INFINITI 5FR JK (CATHETERS) IMPLANT
CATH INFINITI 5FR JL4 (CATHETERS) IMPLANT
CATH LAUNCHER 5F EBU3.5 (CATHETERS) IMPLANT
CATH LAUNCHER 5F RADL (CATHETERS) IMPLANT
CATH VISTA GUIDE 6FR XBLAD3.0 (CATHETERS) IMPLANT
CATHETER LAUNCHER 5F RADL (CATHETERS) ×1
DEVICE RAD COMP TR BAND LRG (VASCULAR PRODUCTS) IMPLANT
DEVICE RAD TR BAND REGULAR (VASCULAR PRODUCTS) IMPLANT
ELECT DEFIB PAD ADLT CADENCE (PAD) IMPLANT
GLIDESHEATH SLEND SS 6F .021 (SHEATH) IMPLANT
GUIDEWIRE INQWIRE 1.5J.035X260 (WIRE) IMPLANT
INQWIRE 1.5J .035X260CM (WIRE) ×1
KIT ENCORE 26 ADVANTAGE (KITS) IMPLANT
PACK CARDIAC CATHETERIZATION (CUSTOM PROCEDURE TRAY) ×1 IMPLANT
SET ATX-X65L (MISCELLANEOUS) IMPLANT
SHEATH PROBE COVER 6X72 (BAG) IMPLANT
STENT ONYX FRONTIER 2.5X12 (Permanent Stent) IMPLANT
TUBING CIL FLEX 10 FLL-RA (TUBING) IMPLANT
WIRE ASAHI PROWATER 180CM (WIRE) IMPLANT
WIRE HI TORQ VERSACORE-J 145CM (WIRE) IMPLANT

## 2023-02-19 NOTE — Progress Notes (Signed)
Pharmacist clarified pt takes metoprolol in afternoon, they adjusted MAR.

## 2023-02-19 NOTE — ED Notes (Signed)
ED TO INPATIENT HANDOFF REPORT  ED Nurse Name and Phone #: Fredric Mare RN  S Name/Age/Gender Heidi Wallace 87 y.o. female Room/Bed: APA03/APA03  Code Status   Code Status: Prior  Home/SNF/Other Home Patient oriented to: self, place, time, and situation Is this baseline? Yes   Triage Complete: Triage complete  Chief Complaint Chest pain [R07.9]  Triage Note Pt in with central substernal chest pain x 5 days. Pt states it began with what she felt as burning/reflux symptoms. Pt states she has taken NTG 5 times today, took last dose 1hr PTA. Arrives pain free, denies any n/v or sob. Pain worse after eating   Allergies Allergies  Allergen Reactions   Clindamycin/Lincomycin Other (See Comments)    c-diff    Levaquin [Levofloxacin In D5w] Itching    At IV site including red streaking   Lipitor [Atorvastatin]     myalgia   Morphine Other (See Comments)    REACTION: Patient states that her mother coded when given this medication therefore patient refuses to take   Quinolones Other (See Comments)    Hx of aortic aneurysm   Ceclor [Cefaclor] Rash   Cephalosporins Rash   Keflex [Cephalexin] Rash   Penicillins Hives and Rash    Has patient had a PCN reaction causing immediate rash, facial/tongue/throat swelling, SOB or lightheadedness with hypotension: Yes Has patient had a PCN reaction causing severe rash involving mucus membranes or skin necrosis: No Has patient had a PCN reaction that required hospitalization: No Has patient had a PCN reaction occurring within the last 10 years: No If all of the above answers are "NO", then may proceed with Cephalosporin use.     Level of Care/Admitting Diagnosis ED Disposition     ED Disposition  Admit   Condition  --   Comment  Hospital Area: [facility] ([facility]) [[id number]]  Level of Care: Telemetry [5]  Covid Evaluation: Asymptomatic - no recent exposure (last 10 days) testing not required  Diagnosis: Chest pain [744799]  Admitting  Physician: Frankey Shown [[id number]]  Attending Physician: Frankey Shown [[id number]]  Certification:: I certify this patient will need inpatient services for at least 2 midnights  Expected Medical Readiness: 02/21/2023          B Medical/Surgery History Past Medical History:  Diagnosis Date   Chest pain    Chronic kidney disease    Coronary artery disease    cardiac cath 08/2011: heavily calcified coronary arteries without obstructive disease. 40% proximal LAD, 40% in OMs.    GERD (gastroesophageal reflux disease)    Headache    History of nuclear stress test 2017   low risk study   Hyperlipidemia    Hypertension    Post concussion syndrome    PVC's (premature ventricular contractions)    PVCs and ventricular bigeminy with abnormal nuclear test   Past Surgical History:  Procedure Laterality Date   ABDOMINAL AORTOGRAM W/LOWER EXTREMITY N/A 11/18/2021   Procedure: ABDOMINAL AORTOGRAM W/LOWER EXTREMITY;  Surgeon: Nada Libman, MD;  [facility]: [facility] INV[facility] CV [facility];  Service: Cardiovascular;  Laterality: N/A;   BIOPSY  07/07/2018   Procedure: BIOPSY;  Surgeon: Malissa Hippo, MD;  Location: [facility] ENDO SU[facility];  Service: Endoscopy;;  gastric polyp   BREAST CYST EXCISION  benign right breast   1982   CARDIAC CATHETERIZATION  07/2011   [facility]; [facility]   COLONOSCOPY N/A 07/07/2018   Procedure: COLONOSCOPY;  Surgeon: Malissa Hippo, MD;  Location: [facility];  Service: Endoscopy;  Laterality: N/A;  12:25   ESOPHAGOGASTRODUODENOSCOPY N/A 07/07/2018   Procedure: ESOPHAGOGASTRODUODENOSCOPY (EGD);  Surgeon: Malissa Hippo, MD;  Location: [facility];  Service: Endoscopy;  Laterality: N/A;   GALLBLADDER SURGERY  2003   PERIPHERAL VASCULAR INTERVENTION  11/18/2021   Procedure: PERIPHERAL VASCULAR INTERVENTION;  Surgeon: Nada Libman, MD;  Location: [facility] INV[facility] CV LAB;  Service: Cardiovascular;;  right SFA   POLYPECTOMY  07/07/2018   Procedure: POLYPECTOMY;  Surgeon: Malissa Hippo, MD;   Location: [facility];  Service: Endoscopy;;  colon    ROTATOR CUFF REPAIR  right 1989   TUBAL LIGATION  1974     A IV Location/Drains/Wounds Patient Lines/Drains/Airways Status     Active Line/Drains/Airways     Name Placement date Placement time Site Days   Peripheral IV 02/19/23 20 G Left Antecubital 02/19/23  0202  Antecubital  less than 1   Peripheral IV 02/19/23 20 G Right Forearm 02/19/23  0355  Forearm  less than 1            Intake/Output Last 24 hours No intake or output data in the 24 hours ending 02/19/23 0407  Labs/Imaging Results for orders placed or performed during the hospital encounter of 02/19/23 (from the past 48 hour(s))  CBC with Differential/Platelet     Status: None   Collection Time: 02/19/23  2:03 AM  Result Value Ref Range   WBC 5.8 4.0 - 10.5 K/uL   RBC 4.26 3.87 - 5.11 MIL/uL   Hemoglobin 13.5 12.0 - 15.0 g/dL   HCT 11.9 14.7 - 82.9 %   MCV 97.2 80.0 - 100.0 fL   MCH 31.7 26.0 - 34.0 pg   MCHC 32.6 30.0 - 36.0 g/dL   RDW 56.2 13.0 - 86.5 %   Platelets 244 150 - 400 K/uL   nRBC 0.0 0.0 - 0.2 %   Neutrophils Relative % 51 %   Neutro Abs 3.0 1.7 - 7.7 K/uL   Lymphocytes Relative 33 %   Lymphs Abs 1.9 0.7 - 4.0 K/uL   Monocytes Relative 12 %   Monocytes Absolute 0.7 0.1 - 1.0 K/uL   Eosinophils Relative 3 %   Eosinophils Absolute 0.2 0.0 - 0.5 K/uL   Basophils Relative 1 %   Basophils Absolute 0.1 0.0 - 0.1 K/uL   Immature Granulocytes 0 %   Abs Immature Granulocytes 0.02 0.00 - 0.07 K/uL    Comment: Performed at [facility], [address], [place], [place] [address]  Comprehensive metabolic panel     Status: Abnormal   Collection Time: 02/19/23  2:03 AM  Result Value Ref Range   Sodium 135 135 - 145 mmol/L   Potassium 3.7 3.5 - 5.1 mmol/L   Chloride 103 98 - 111 mmol/L   CO2 26 22 - 32 mmol/L   Glucose, Bld 105 (H) 70 - 99 mg/dL    Comment: Glucose reference range applies only to samples taken after fasting for at least 8  hours.   BUN 18 8 - 23 mg/dL   Creatinine, Ser 6.29 0.44 - 1.00 mg/dL   Calcium 9.4 8.9 - 52.8 mg/dL   Total Protein 7.0 6.5 - 8.1 g/dL   Albumin 4.3 3.5 - 5.0 g/dL   AST 18 15 - 41 U/L   ALT 15 0 - 44 U/L   Alkaline Phosphatase 116 38 - 126 U/L   Total Bilirubin 0.5 0.3 - 1.2 mg/dL   GFR, Estimated >41 >32 mL/min    Comment: (NOTE) Calculated using  the CKD-EPI Creatinine Equation (2021)    Anion gap 6 5 - 15    Comment: Performed at [facility], [address], [place], [place] [address]  Troponin I (High Sensitivity)     Status: Abnormal   Collection Time: 02/19/23  2:03 AM  Result Value Ref Range   Troponin I (High Sensitivity) 42 (H) <18 ng/L    Comment: (NOTE) Elevated high sensitivity troponin I (hsTnI) values and significant  changes across serial measurements may suggest ACS but many other  chronic and acute conditions are known to elevate hsTnI results.  Refer to the "Links" section for chest pain algorithms and additional  guidance. Performed at [facility], [address] [place], [place] [address]    DG Chest Port 1 View  Result Date: 02/19/2023 CLINICAL DATA:  Central substernal chest pain for 5 days. EXAM: PORTABLE CHEST 1 VIEW COMPARISON:  Chest radiograph 02/20/2017 FINDINGS: Stable cardiomediastinal silhouette. Aortic atherosclerotic calcification. Bibasilar atelectasis. Otherwise no focal consolidation, pleural effusion, or pneumothorax. Chronic bronchitic changes. No displaced rib fractures. IMPRESSION: No acute cardiopulmonary disease. Electronically Signed   By: Minerva Fester M.D.   On: 02/19/2023 02:18    Pending Labs Unresulted Labs (From admission, onward)    None       Vitals/Pain Today's Vitals   02/19/23 0330 02/19/23 0331 02/19/23 0356 02/19/23 0400  BP: (!) 185/99 (!) 177/85  (!) 171/78  Pulse: 68 72  64  Resp: 17 20  19   Temp:      TempSrc:      SpO2: 100% 99%  97%  Weight:      PainSc:   4      Isolation Precautions No active  isolations  Medications Medications  heparin ADULT infusion 100 units/mL (25000 units/223mL) (600 Units/hr Intravenous New Bag/Given 02/19/23 2956)  aspirin chewable tablet 324 mg (324 mg Oral Given 02/19/23 0302)  heparin bolus via infusion 3,000 Units (3,000 Units Intravenous Bolus from Bag 02/19/23 0305)  nitroGLYCERIN (NITROGLYN) 2 % ointment 1 inch (1 inch Topical Given 02/19/23 0321)    Mobility walks with stand by     Focused Assessments Cardiac Assessment Handoff:  Cardiac Rhythm: Normal sinus rhythm Lab Results  Component Value Date   TROPONINI <0.03 02/20/2017   No results found for: "DDIMER" Does the Patient currently have chest pain? No    R Recommendations: See Admitting Provider Note  Report given to:   Additional Notes: - admitted with unstable angina. Has episode of CP lasting 1 min that has now completely resolved.  Second troponin sent and is pending result.  Husband, who has dementia, is the only family at bedside. Pt's son to come back to stay with pt/husband.

## 2023-02-19 NOTE — Progress Notes (Signed)
ANTICOAGULATION CONSULT NOTE - Initial Consult  Pharmacy Consult for Heparin  Indication: chest pain/ACS  Allergies  Allergen Reactions   Clindamycin/Lincomycin Other (See Comments)    c-diff    Levaquin [Levofloxacin In D5w] Itching    At IV site including red streaking   Lipitor [Atorvastatin]     myalgia   Morphine Other (See Comments)    REACTION: Patient states that her mother coded when given this medication therefore patient refuses to take   Quinolones Other (See Comments)    Hx of aortic aneurysm   Ceclor [Cefaclor] Rash   Cephalosporins Rash   Keflex [Cephalexin] Rash   Penicillins Hives and Rash    Has patient had a PCN reaction causing immediate rash, facial/tongue/throat swelling, SOB or lightheadedness with hypotension: Yes Has patient had a PCN reaction causing severe rash involving mucus membranes or skin necrosis: No Has patient had a PCN reaction that required hospitalization: No Has patient had a PCN reaction occurring within the last 10 years: No If all of the above answers are "NO", then may proceed with Cephalosporin use.    Patient Measurements: Weight: 50.3 kg (111 lb)  Vital Signs: Temp: 97.8 F (36.6 C) (08/23 0434) Temp Source: Oral (08/23 0434) BP: 172/74 (08/23 0415) Pulse Rate: 63 (08/23 0415)  Labs: Recent Labs    02/19/23 0203  HGB 13.5  HCT 41.4  PLT 244  CREATININE 0.92  TROPONINIHS 42*    Estimated Creatinine Clearance: 35.5 mL/min (by C-G formula based on SCr of 0.92 mg/dL).   Medical History: Past Medical History:  Diagnosis Date   Chest pain    Chronic kidney disease    Coronary artery disease    cardiac cath 08/2011: heavily calcified coronary arteries without obstructive disease. 40% proximal LAD, 40% in OMs.    GERD (gastroesophageal reflux disease)    Headache    History of nuclear stress test 2017   low risk study   Hyperlipidemia    Hypertension    Post concussion syndrome    PVC's (premature ventricular  contractions)    PVCs and ventricular bigeminy with abnormal nuclear test    Assessment: 87 y/o F with chest pain x 5 days. Small elevated in high sensitivity troponin. Starting heparin. Labs above reviewed. PTA meds reviewed.   Goal of Therapy:  Heparin level 0.3-0.7 units/ml Monitor platelets by anticoagulation protocol: Yes   Plan:  Heparin 3000 units BOLUS Start heparin drip at 600 units/hr 1100 Heparin level Daily CBC/Heparin level Monitor for bleeding  Abran Duke, PharmD, BCPS Clinical Pharmacist Phone: [phone]

## 2023-02-19 NOTE — ED Provider Notes (Signed)
[place] EMERGENCY DEPARTMENT AT [facility] Provider Note   CSN: [id number] Arrival date & time: 02/19/23  0151     History  Chief Complaint  Patient presents with   Chest Pain    Heidi Wallace is a 87 y.o. female.  Patient presents to the emergency department for evaluation of chest pain.  She reports that she has been having episodes of chest pain this week.  She reports that she has taken nitroglycerin multiple times each day this week, this is unusual for her.  Patient had an episode tonight that woke her from sleep.  She took another nitroglycerin and pain has now resolved.       Home Medications Prior to Admission medications   Medication Sig Start Date End Date Taking? Authorizing Provider  aspirin EC 81 MG tablet Take 81 mg by mouth in the morning. 07/17/18   Malissa Hippo, MD  cetirizine (ZYRTEC) 10 MG tablet Take 10 mg by mouth daily as needed for allergies (sinus drainage.).     [provider]  Cholecalciferol (VITAMIN D3) 50 MCG (2000 UT) capsule Take 2,000 Units by mouth in the morning.    [provider]  cyclobenzaprine (FLEXERIL) 10 MG tablet Take 5-10 mg by mouth 3 (three) times daily as needed for muscle spasms. 11/01/21   [provider]  desoximetasone (TOPICORT) 0.25 % cream Apply 1 application. topically daily as needed (eczema). 08/11/21   [provider]  esomeprazole (NEXIUM) 40 MG capsule Take 40 mg by mouth daily before breakfast. 11/07/16   [provider]  ezetimibe (ZETIA) 10 MG tablet Take 10 mg by mouth in the morning.    [provider]  gabapentin (NEURONTIN) 300 MG capsule Take 300 mg by mouth daily at 6 PM.    [provider]  meloxicam (MOBIC) 15 MG tablet Take 15 mg by mouth daily. 04/21/22   [provider]  metoprolol succinate (TOPROL-XL) 50 MG 24 hr tablet Take 50 mg by mouth in the morning.    [provider]  nitroGLYCERIN (NITROSTAT) 0.4 MG  SL tablet Place 1 tablet (0.4 mg total) under the tongue every 5 (five) minutes as needed for chest pain. 09/17/22   Chandrasekhar, [place] Dragon A, MD  olmesartan (BENICAR) 20 MG tablet Take 1 tablet (20 mg total) by mouth daily. 01/11/23   Jake Bathe, MD  promethazine (PHENERGAN) 25 MG tablet Take 25 mg by mouth every 4 (four) hours as needed for nausea or vomiting. 11/04/21   [provider]  ROCKLATAN 0.02-0.005 % SOLN Place 1 drop into both eyes at bedtime. 12/09/20   [provider]  rosuvastatin (CRESTOR) 5 MG tablet Take 5 mg by mouth every other day. In the morning    [provider]  sodium chloride (MURO 128) 5 % ophthalmic solution Place 1 drop into both eyes in the morning and at bedtime.    [provider]      Allergies    Clindamycin/lincomycin, Levaquin [levofloxacin in d5w], Lipitor [atorvastatin], Morphine, Quinolones, Ceclor [cefaclor], Cephalosporins, Keflex [cephalexin], and Penicillins    Review of Systems   Review of Systems  Physical Exam Updated Vital Signs BP (!) 154/61   Pulse 75   Temp 97.9 F (36.6 C) (Oral)   Resp 19   Wt 50.3 kg   SpO2 98%   BMI 19.66 kg/m  Physical Exam Vitals and nursing note reviewed.  Constitutional:      General: She is not in  acute distress.    Appearance: She is well-developed.  HENT:     Head: Normocephalic and atraumatic.     Mouth/Throat:     Mouth: Mucous membranes are moist.  Eyes:     General: Vision grossly intact. Gaze aligned appropriately.     Extraocular Movements: Extraocular movements intact.     Conjunctiva/sclera: Conjunctivae normal.  Cardiovascular:     Rate and Rhythm: Normal rate and regular rhythm.     Pulses: Normal pulses.     Heart sounds: Normal heart sounds, S1 normal and S2 normal. No murmur heard.    No friction rub. No gallop.  Pulmonary:     Effort: Pulmonary effort is normal. No respiratory distress.     Breath sounds: Normal breath sounds.  Abdominal:      General: Bowel sounds are normal.     Palpations: Abdomen is soft.     Tenderness: There is no abdominal tenderness. There is no guarding or rebound.     Hernia: No hernia is present.  Musculoskeletal:        General: No swelling.     Cervical back: Full passive range of motion without pain, normal range of motion and neck supple. No spinous process tenderness or muscular tenderness. Normal range of motion.     Right lower leg: No edema.     Left lower leg: No edema.  Skin:    General: Skin is warm and dry.     Capillary Refill: Capillary refill takes less than 2 seconds.     Findings: No ecchymosis, erythema, rash or wound.  Neurological:     General: No focal deficit present.     Mental Status: She is alert and oriented to person, place, and time.     GCS: GCS eye subscore is 4. GCS verbal subscore is 5. GCS motor subscore is 6.     Cranial Nerves: Cranial nerves 2-12 are intact.     Sensory: Sensation is intact.     Motor: Motor function is intact.     Coordination: Coordination is intact.  Psychiatric:        Attention and Perception: Attention normal.        Mood and Affect: Mood normal.        Speech: Speech normal.        Behavior: Behavior normal.     ED Results / Procedures / Treatments   Labs (all labs ordered are listed, but only abnormal results are displayed) Labs Reviewed  COMPREHENSIVE METABOLIC PANEL - Abnormal; Notable for the following components:      Result Value   Glucose, Bld 105 (*)    All other components within normal limits  TROPONIN I (HIGH SENSITIVITY) - Abnormal; Notable for the following components:   Troponin I (High Sensitivity) 42 (*)    All other components within normal limits  CBC WITH DIFFERENTIAL/PLATELET    EKG EKG Interpretation Date/Time:  Friday February 19 2023 02:01:53 EDT Ventricular Rate:  73 PR Interval:  158 QRS Duration:  106 QT Interval:  382 QTC Calculation: 421 R Axis:   -19  Text Interpretation: Sinus rhythm  Borderline left axis deviation RSR' in V1 or V2, right VCD or RVH No acute changes Confirmed by Gilda Crease [phone]) on 02/19/2023 2:04:31 AM  Radiology DG Chest Port 1 View  Result Date: 02/19/2023 CLINICAL DATA:  Central substernal chest pain for 5 days. EXAM: PORTABLE CHEST 1 VIEW COMPARISON:  Chest radiograph 02/20/2017 FINDINGS: Stable cardiomediastinal silhouette. Aortic atherosclerotic  calcification. Bibasilar atelectasis. Otherwise no focal consolidation, pleural effusion, or pneumothorax. Chronic bronchitic changes. No displaced rib fractures. IMPRESSION: No acute cardiopulmonary disease. Electronically Signed   By: Minerva Fester M.D.   On: 02/19/2023 02:18    Procedures .Critical Care  Performed by: Gilda Crease, MD Authorized by: Gilda Crease, MD   Critical care provider statement:    Critical care time (minutes):  30   Critical care was time spent personally by me on the following activities:  Development of treatment plan with patient or surrogate, discussions with consultants, evaluation of patient's response to treatment, examination of patient, ordering and review of laboratory studies, ordering and review of radiographic studies, ordering and performing treatments and interventions, pulse oximetry, re-evaluation of patient's condition and review of old charts   I assumed direction of critical care for this patient from another provider in my specialty: no     Care discussed with: admitting provider       Medications Ordered in ED Medications  heparin ADULT infusion 100 units/mL (25000 units/284mL) (600 Units/hr Intravenous New Bag/Given 02/19/23 0307)  nitroGLYCERIN (NITROGLYN) 2 % ointment 1 inch (has no administration in time range)  aspirin chewable tablet 324 mg (324 mg Oral Given 02/19/23 0302)  heparin bolus via infusion 3,000 Units (3,000 Units Intravenous Bolus from Bag 02/19/23 0305)    ED Course/ Medical Decision Making/ A&P              HEART Score: 6                    Medical Decision Making Amount and/or Complexity of Data Reviewed External Data Reviewed: notes.    Details: Patient with difficult to manage hypertension.  No prior heart catheterization.  Nuclear medicine study years ago.  Patient had a low risk Lexiscan in March of this year, and an echo in April of this year that showed dilated aortic root, no other abnormality. Labs: ordered. Radiology: ordered.  Risk OTC drugs.   Differential Diagnosis considered includes, but not limited to: STEMI; NSTEMI; myocarditis; pericarditis; pulmonary embolism; aortic dissection; pneumothorax; pneumonia; gastritis; musculoskeletal pain  Patient with chest pain.  She has a prescription for nitroglycerin provided to her by her cardiologist.  She reports that she has have frequent recurrent chest pain this week that has required nitroglycerin.  She has not had to take more than 1 nitroglycerin each time, but this is unusual, has not had to do this prior to this week.  No associated nausea, diaphoresis, shortness of breath.  EKG at arrival with no acute changes.  First troponin elevated at 42.  Patient with a very concerning story for ACS, despite recently having a negative Lexiscan.  Discussed with Dr. Noemi Chapel, on-call for cardiology, about the possibility of transfer to [facility] for further management of ACS/likely NSTEMI.  She does not feel that the patient requires transfer to [facility] at this time.  I will therefore admit the patient here for further management, cardiology consultation in the morning.        Final Clinical Impression(s) / ED Diagnoses Final diagnoses:  Unstable angina [facility])    Rx / DC Orders ED Discharge Orders     None         Fraidy Mccarrick, Canary Brim, MD 02/19/23 [phone]

## 2023-02-19 NOTE — Progress Notes (Signed)
Patient seen and evaluated, chart reviewed, please see EMR for updated orders. Please see full H&P dictated by admitting physician Dr. Thomes Dinning for same date of service.   Brief Summary:-  87 y.o. female with medical history significant of GERD, hypertension, hyperlipidemia, and PAD who was admitted on 02/19/2023 with midsternal intermittent chest pain that radiates to the jaws and to left upper extremity.  Chest pain was alleviated with nitroglycerin which she took several times daily since onset of symptoms a few days ago. -Recent cardiovascular nuclear stress test from 09/23/2022 noted -TTE from 10/27/2022 noted with EF of 55 to 50% and grade 1 diastolic dysfunction -Cardiology consult appreciated recommends transfer to [facility] for [facility] on 02/19/2023   A/p 1) chest pains--- midsternal intermittent chest pain that radiates to the jaws and to left upper extremity.  Chest pain was alleviated with nitroglycerin which she took several times daily since onset of symptoms a few days ago. -remote cath with findings below in 2013 with moderate nonobstructive CAD with heavily calcified coronaries with scattered 30-50% stenoses in several locations  -Recent cardiovascular nuclear stress test from 09/23/2022 noted -TTE from 10/27/2022 noted with EF of 55 to 50% and grade 1 diastolic dysfunction Troponin 16>>10 >>49 >>50 -EKG was sinus rhythm without new acute changes -Cardiology consult appreciated recommends transfer to [facility] for [facility] on 02/19/2023 -Continue aspirin, Crestor and Zetia as well as metoprolol -Continue IV heparin drip  2)PAD--with h/o critical limb ischemia and R popliteal stenting 10/2021,  -Aspirin, and statin Crestor as ordered  3)Aortic Aneurysm----ascending aortic aneurysm (4.1cm by CTA 04/2019, 4.1cm by echo 09/2022)  -Ongoing annual surveillance as previously advised  4)GERD--continue PPI  5)HTN--BP is not at goal, give Avapro and metoprolol -May use IV  hydralazine as needed elevated BP   -Plan of care discussed with patient, sister-in-law and husband at bedside -Discussed with cardiology team  Total care time 51 minutes  Patient seen and evaluated, chart reviewed, please see EMR for updated orders. Please see full H&P dictated by admitting physician Dr. Thomes Dinning for same date of service.  Shon Hale, MD

## 2023-02-19 NOTE — H&P (Signed)
History and Physical    Patient: Heidi Wallace:[id number] DOB: 08/30/1935 DOA: 02/19/2023 DOS: the patient was seen and examined on 02/19/2023 PCP: Estanislado Pandy, MD  Patient coming from: Home  Chief Complaint:  Chief Complaint  Patient presents with   Chest Pain   HPI: Heidi Wallace is a 87 y.o. female with medical history significant of GERD, hypertension, hyperlipidemia, PAD who presents to the emergency department due to 4-day onset of intermittent chest pain.  Chest pain was midsternal and worse with intermittent radiation to the jaws and to left upper extremity.  Chest pain was alleviated with nitroglycerin which she took several times daily since onset of symptoms.  She had another episode that woke her up from sleep tonight, she took nitroglycerin and chest pain resolved.  ED Course: In the emergency department, BP was 177/87, other vital signs were within normal range.  Workup in the ED showed normal CBC and BMP except for blood glucose of 105, troponin x 1 was 42. Chest x-ray showed no acute cardiopulmonary disease Aspirin 324 mg p.o. x 1 was given, nitroglycerin paste was given and patient was started on IV heparin drip. Cardiologist (Dr. Noemi Chapel) was consulted about the possibility of transferring patient to [facility] for further management by ED physician, but she did not feel the patient requires transfer to [facility] at this time.  Hospitalist was asked to respiratory for further evaluation and management.  Review of Systems: Review of systems as noted in the HPI. All other systems reviewed and are negative.   Past Medical History:  Diagnosis Date   Chest pain    Chronic kidney disease    Coronary artery disease    cardiac cath 08/2011: heavily calcified coronary arteries without obstructive disease. 40% proximal LAD, 40% in OMs.    GERD (gastroesophageal reflux disease)    Headache    History of nuclear stress test 2017   low risk study    Hyperlipidemia    Hypertension    Post concussion syndrome    PVC's (premature ventricular contractions)    PVCs and ventricular bigeminy with abnormal nuclear test   Past Surgical History:  Procedure Laterality Date   ABDOMINAL AORTOGRAM W/LOWER EXTREMITY N/A 11/18/2021   Procedure: ABDOMINAL AORTOGRAM W/LOWER EXTREMITY;  Surgeon: Nada Libman, MD;  Location: [facility];  Service: Cardiovascular;  Laterality: N/A;   BIOPSY  07/07/2018   Procedure: BIOPSY;  Surgeon: Malissa Hippo, MD;  Location: [facility];  Service: Endoscopy;;  gastric polyp   BREAST CYST EXCISION  benign right breast   1982   CARDIAC CATHETERIZATION  07/2011   [facility]; [facility]   COLONOSCOPY N/A 07/07/2018   Procedure: COLONOSCOPY;  Surgeon: Malissa Hippo, MD;  Location: [facility];  Service: Endoscopy;  Laterality: N/A;  12:25   ESOPHAGOGASTRODUODENOSCOPY N/A 07/07/2018   Procedure: ESOPHAGOGASTRODUODENOSCOPY (EGD);  Surgeon: Malissa Hippo, MD;  Location: [facility];  Service: Endoscopy;  Laterality: N/A;   GALLBLADDER SURGERY  2003   PERIPHERAL VASCULAR INTERVENTION  11/18/2021   Procedure: PERIPHERAL VASCULAR INTERVENTION;  Surgeon: Nada Libman, MD;  Location: [facility];  Service: Cardiovascular;;  right SFA   POLYPECTOMY  07/07/2018   Procedure: POLYPECTOMY;  Surgeon: Malissa Hippo, MD;  Location: [facility];  Service: Endoscopy;;  colon    ROTATOR CUFF REPAIR  right 1989   TUBAL LIGATION  1974    Social History:  reports that she quit smoking  about 50 years ago. Her smoking use included cigarettes. She started smoking about 66 years ago. She has a 16 pack-year smoking history. She has never been exposed to tobacco smoke. She has never used smokeless tobacco. She reports that she does not drink alcohol and does not use drugs.   Allergies  Allergen Reactions   Clindamycin/Lincomycin Other (See Comments)    c-diff    Levaquin [Levofloxacin In D5w] Itching    At IV site  including red streaking   Lipitor [Atorvastatin]     myalgia   Morphine Other (See Comments)    REACTION: Patient states that her mother coded when given this medication therefore patient refuses to take   Quinolones Other (See Comments)    Hx of aortic aneurysm   Ceclor [Cefaclor] Rash   Cephalosporins Rash   Keflex [Cephalexin] Rash   Penicillins Hives and Rash    Has patient had a PCN reaction causing immediate rash, facial/tongue/throat swelling, SOB or lightheadedness with hypotension: Yes Has patient had a PCN reaction causing severe rash involving mucus membranes or skin necrosis: No Has patient had a PCN reaction that required hospitalization: No Has patient had a PCN reaction occurring within the last 10 years: No If all of the above answers are "NO", then may proceed with Cephalosporin use.     Family History  Problem Relation Age of Onset   Heart disease Father    Heart disease Mother      Prior to Admission medications   Medication Sig Start Date End Date Taking? Authorizing Provider  aspirin EC 81 MG tablet Take 81 mg by mouth in the morning. 07/17/18   Malissa Hippo, MD  cetirizine (ZYRTEC) 10 MG tablet Take 10 mg by mouth daily as needed for allergies (sinus drainage.).     [provider]  Cholecalciferol (VITAMIN D3) 50 MCG (2000 UT) capsule Take 2,000 Units by mouth in the morning.    [provider]  cyclobenzaprine (FLEXERIL) 10 MG tablet Take 5-10 mg by mouth 3 (three) times daily as needed for muscle spasms. 11/01/21   [provider]  desoximetasone (TOPICORT) 0.25 % cream Apply 1 application. topically daily as needed (eczema). 08/11/21   [provider]  esomeprazole (NEXIUM) 40 MG capsule Take 40 mg by mouth daily before breakfast. 11/07/16   [provider]  ezetimibe (ZETIA) 10 MG tablet Take 10 mg by mouth in the morning.    [provider]  gabapentin (NEURONTIN) 300 MG capsule Take 300 mg by mouth  daily at 6 PM.    [provider]  meloxicam (MOBIC) 15 MG tablet Take 15 mg by mouth daily. 04/21/22   [provider]  metoprolol succinate (TOPROL-XL) 50 MG 24 hr tablet Take 50 mg by mouth in the morning.    [provider]  nitroGLYCERIN (NITROSTAT) 0.4 MG SL tablet Place 1 tablet (0.4 mg total) under the tongue every 5 (five) minutes as needed for chest pain. 09/17/22   Chandrasekhar, [place] Dragon A, MD  olmesartan (BENICAR) 20 MG tablet Take 1 tablet (20 mg total) by mouth daily. 01/11/23   Jake Bathe, MD  promethazine (PHENERGAN) 25 MG tablet Take 25 mg by mouth every 4 (four) hours as needed for nausea or vomiting. 11/04/21   [provider]  ROCKLATAN 0.02-0.005 % SOLN Place 1 drop into both eyes at bedtime. 12/09/20   [provider]  rosuvastatin (CRESTOR) 5 MG tablet Take 5 mg by mouth every other day. In  the morning    [provider]  sodium chloride (MURO 128) 5 % ophthalmic solution Place 1 drop into both eyes in the morning and at bedtime.    [provider]    Physical Exam: BP (!) 167/67 (BP Location: Right Arm)   Pulse 68   Temp 98.2 F (36.8 C) (Oral)   Resp 16   Ht 5\' 3"  (1.6 m)   Wt 52.3 kg   SpO2 99%   BMI 20.42 kg/m   General: 87 y.o. year-old female well developed well nourished in no acute distress.  Alert and oriented x3. HEENT: NCAT, EOMI Neck: Supple, trachea medial Cardiovascular: Regular rate and rhythm with no rubs or gallops.  No thyromegaly or JVD noted.  No lower extremity edema. 2/4 pulses in all 4 extremities. Respiratory: Clear to auscultation with no wheezes or rales. Good inspiratory effort. Abdomen: Soft, nontender nondistended with normal bowel sounds x4 quadrants. Muskuloskeletal: No cyanosis, clubbing or edema noted bilaterally Neuro: CN II-XII intact, strength 5/5 x 4, sensation, reflexes intact Skin: No ulcerative lesions noted or rashes Psychiatry: Judgement and insight appear  normal. Mood is appropriate for condition and setting          Labs on Admission:  Basic Metabolic Panel: Recent Labs  Lab 02/19/23 0203  NA 135  K 3.7  CL 103  CO2 26  GLUCOSE 105*  BUN 18  CREATININE 0.92  CALCIUM 9.4   Liver Function Tests: Recent Labs  Lab 02/19/23 0203  AST 18  ALT 15  ALKPHOS 116  BILITOT 0.5  PROT 7.0  ALBUMIN 4.3   No results for input(s): "LIPASE", "AMYLASE" in the last 168 hours. No results for input(s): "AMMONIA" in the last 168 hours. CBC: Recent Labs  Lab 02/19/23 0203  WBC 5.8  NEUTROABS 3.0  HGB 13.5  HCT 41.4  MCV 97.2  PLT 244   Cardiac Enzymes: No results for input(s): "CKTOTAL", "CKMB", "CKMBINDEX", "TROPONINI" in the last 168 hours.  BNP (last 3 results) No results for input(s): "BNP" in the last 8760 hours.  ProBNP (last 3 results) No results for input(s): "PROBNP" in the last 8760 hours.  CBG: No results for input(s): "GLUCAP" in the last 168 hours.  Radiological Exams on Admission: DG Chest Port 1 View  Result Date: 02/19/2023 CLINICAL DATA:  Central substernal chest pain for 5 days. EXAM: PORTABLE CHEST 1 VIEW COMPARISON:  Chest radiograph 02/20/2017 FINDINGS: Stable cardiomediastinal silhouette. Aortic atherosclerotic calcification. Bibasilar atelectasis. Otherwise no focal consolidation, pleural effusion, or pneumothorax. Chronic bronchitic changes. No displaced rib fractures. IMPRESSION: No acute cardiopulmonary disease. Electronically Signed   By: Minerva Fester M.D.   On: 02/19/2023 02:18    EKG: I independently viewed the EKG done and my findings are as followed: Normal sinus rhythm at a rate of 73 bpm with ST depression in inferolateral leads (no acute changes)  Assessment/Plan Present on Admission:  Chest pain  Essential hypertension  Mixed hyperlipidemia  GERD (gastroesophageal reflux disease)  Coronary artery disease  Principal Problem:   Chest pain Active Problems:   GERD (gastroesophageal  reflux disease)   Essential hypertension   Mixed hyperlipidemia   Coronary artery disease   Peripheral arterial disease (HCC)  Chest pain possibly due to type II demand ischemia, rule out NSTEMI Cardiovascular risk factors include hypertension, hyperlipidemia, PAD Continue telemetry  Troponins x 2 - 42 > 46, continue to trend troponin EKG showed Normal sinus rhythm at a rate of 73 bpm with ST depression in  inferolateral leads Continue to trend troponin Continue heparin drip Cardiology will be consulted to help decide if Stress test is needed in am Versus other  diagnostic modalities.    Aspirin 324 mg x 1 was given Nitroglycerin paste was given Continue aspirin 81 mg p.o. daily  Essential hypertension Continue Avapro  Mixed hyperlipidemia Continue Crestor, Zetia  GERD Continue Protonix  CAD/peripheral arterial disease Continue aspirin, Crestor   DVT prophylaxis: Heparin drip  Advance Care Planning: Full code  Consults: Cardiology  Family Communication: Husband at bedside  Severity of Illness: The appropriate patient status for this patient is OBSERVATION. Observation status is judged to be reasonable and necessary in order to provide the required intensity of service to ensure the patient's safety. The patient's presenting symptoms, physical exam findings, and initial radiographic and laboratory data in the context of their medical condition is felt to place them at decreased risk for further clinical deterioration. Furthermore, it is anticipated that the patient will be medically stable for discharge from the hospital within 2 midnights of admission.   Author: Frankey Shown, DO 02/19/2023 6:11 AM  For on call review [email or web address].

## 2023-02-19 NOTE — Consult Note (Addendum)
Cardiology Consultation   Patient ID: STALEY ZIMAN MRN: [id number]; DOB: 1935-10-03  Admit date: 02/19/2023 Date of Consult: 02/19/2023  PCP:  Estanislado Pandy, MD   [facility] Providers Cardiologist:  Donato Schultz, MD        Patient Profile:   Heidi Wallace is a 87 y.o. 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 Mariea Clonts.  History of Present Illness:   Ms. Yorker had remote cath with findings below in 2013 with moderate nonobstructive CAD with heavily calcified coronaries with scattered 30-50% stenoses in several locations. She also underwent LE stenting by [facility] in 10/2021.  She underwent stress test in 08/2022 for chest pain which was negative for ischemia. 2D echo 09/2022 showed EF 55-60%, G1DD, aortic sclerosis without stenosis, mild AI, mild dilation of ascending aorta 41mm. 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 She is Mindi Junker Robertson's mother-in-law (Dr. Odessa Fleming nurse). She gives the OK to relay info to [employer].  Since Monday 8/19 she has been experiencing midsternal chest pain. This originally seemed to be provoked by meals, lasting a few minutes, but over the last day progressed to happening spontaneously without provocation as well. She began to trial SL NTG and the pain would relieve almost instantaneously. She took 5 tablets yesterday scattered throughout the day. She recalled that NTG could potentially help esophageal spasm so she did not know if it was GI vs cardiac. Overnight she was awakened with substernal chest discomfort with radiation to her jaw so took additional SL NTG and came to the ER  for evaluation. hsTroponins 42->46->49->50. EKG shows NSR with only mild subtle ST sagging V5-V6 but otherwise nonacute. She is currently asymptomatic. She is typically very functional at home and helps to care for her husband who has some dementia. She has some claudication with walking though can go about 20-30 minutes before her legs get tired. She tried walking earlier this week and was able to do so but noticed some increased fatigue towards the end. At [facility] she received 324mg  ASA, NTG paste, and was started on heparin infusion. She has been NPO since arrival.  Past Medical History:  Diagnosis Date   Chest pain    Chronic kidney disease    Coronary artery disease    cardiac cath 08/2011: heavily calcified coronary arteries without obstructive disease. 40% proximal LAD, 40% in OMs.    GERD (gastroesophageal reflux disease)    Headache    History of nuclear stress test 2017   low risk study   Hyperlipidemia    Hypertension    Post concussion syndrome    PVC's (premature ventricular contractions)    PVCs and ventricular bigeminy with abnormal nuclear test    Past Surgical History:  Procedure Laterality Date   ABDOMINAL AORTOGRAM W/LOWER EXTREMITY N/A 11/18/2021   Procedure: ABDOMINAL AORTOGRAM W/LOWER EXTREMITY;  Surgeon: Nada Libman, MD;  Location: [facility];  Service: Cardiovascular;  Laterality: N/A;   BIOPSY  07/07/2018   Procedure: BIOPSY;  Surgeon: Malissa Hippo, MD;  Location: [facility];  Service: Endoscopy;;  gastric polyp   BREAST  CYST EXCISION  benign right breast   1982   CARDIAC CATHETERIZATION  07/2011   [facility]; [facility]   COLONOSCOPY N/A 07/07/2018   Procedure: COLONOSCOPY;  Surgeon: Malissa Hippo, MD;  Location: [facility];  Service: Endoscopy;  Laterality: N/A;  12:25   ESOPHAGOGASTRODUODENOSCOPY N/A 07/07/2018   Procedure: ESOPHAGOGASTRODUODENOSCOPY (EGD);  Surgeon: Malissa Hippo, MD;  Location: [facility];  Service: Endoscopy;  Laterality: N/A;    GALLBLADDER SURGERY  2003   PERIPHERAL VASCULAR INTERVENTION  11/18/2021   Procedure: PERIPHERAL VASCULAR INTERVENTION;  Surgeon: Nada Libman, MD;  Location: [facility];  Service: Cardiovascular;;  right SFA   POLYPECTOMY  07/07/2018   Procedure: POLYPECTOMY;  Surgeon: Malissa Hippo, MD;  Location: [facility];  Service: Endoscopy;;  colon    ROTATOR CUFF REPAIR  right 1989   TUBAL LIGATION  1974     Home Medications:  Prior to Admission medications   Medication Sig Start Date End Date Taking? Authorizing Provider  aspirin EC 81 MG tablet Take 81 mg by mouth in the morning. 07/17/18   Malissa Hippo, MD  cetirizine (ZYRTEC) 10 MG tablet Take 10 mg by mouth daily as needed for allergies (sinus drainage.).     [provider]  Cholecalciferol (VITAMIN D3) 50 MCG (2000 UT) capsule Take 2,000 Units by mouth in the morning.    [provider]  cyclobenzaprine (FLEXERIL) 10 MG tablet Take 5-10 mg by mouth 3 (three) times daily as needed for muscle spasms. 11/01/21   [provider]  desoximetasone (TOPICORT) 0.25 % cream Apply 1 application. topically daily as needed (eczema). 08/11/21   [provider]  esomeprazole (NEXIUM) 40 MG capsule Take 40 mg by mouth daily before breakfast. 11/07/16   [provider]  ezetimibe (ZETIA) 10 MG tablet Take 10 mg by mouth in the morning.    [provider]  gabapentin (NEURONTIN) 300 MG capsule Take 300 mg by mouth daily at 6 PM.    [provider]  meloxicam (MOBIC) 15 MG tablet Take 15 mg by mouth daily. 04/21/22   [provider]  metoprolol succinate (TOPROL-XL) 50 MG 24 hr tablet Take 50 mg by mouth in the morning.    [provider]  nitroGLYCERIN (NITROSTAT) 0.4 MG SL tablet Place 1 tablet (0.4 mg total) under the tongue every 5 (five) minutes as needed for chest pain. 09/17/22   Chandrasekhar, [place] Dragon A, MD  olmesartan (BENICAR) 20 MG tablet Take 1 tablet (20 mg  total) by mouth daily. 01/11/23   Jake Bathe, MD  promethazine (PHENERGAN) 25 MG tablet Take 25 mg by mouth every 4 (four) hours as needed for nausea or vomiting. 11/04/21   [provider]  ROCKLATAN 0.02-0.005 % SOLN Place 1 drop into both eyes at bedtime. 12/09/20   [provider]  rosuvastatin (CRESTOR) 5 MG tablet Take 5 mg by mouth every other day. In the morning    [provider]  sodium chloride (MURO 128) 5 % ophthalmic solution Place 1 drop into both eyes in the morning and at bedtime.    [provider]    Inpatient Medications: Scheduled Meds:  aspirin EC  81 mg Oral q AM   ezetimibe  10 mg Oral q AM   irbesartan  150 mg Oral Daily   metoprolol succinate  50 mg Oral q AM   pantoprazole  40 mg Oral Daily   rosuvastatin  5 mg Oral  QODAY   Continuous Infusions:  heparin Stopped (02/19/23 0626)   PRN Meds: acetaminophen **OR** acetaminophen  Allergies:    Allergies  Allergen Reactions   Clindamycin/Lincomycin Other (See Comments)    c-diff    Levaquin [Levofloxacin In D5w] Itching    At IV site including red streaking   Lipitor [Atorvastatin]     myalgia   Morphine Other (See Comments)    REACTION: Patient states that her mother coded when given this medication therefore patient refuses to take   Quinolones Other (See Comments)    Hx of aortic aneurysm   Ceclor [Cefaclor] Rash   Cephalosporins Rash   Keflex [Cephalexin] Rash   Penicillins Hives and Rash    Has patient had a PCN reaction causing immediate rash, facial/tongue/throat swelling, SOB or lightheadedness with hypotension: Yes Has patient had a PCN reaction causing severe rash involving mucus membranes or skin necrosis: No Has patient had a PCN reaction that required hospitalization: No Has patient had a PCN reaction occurring within the last 10 years: No If all of the above answers are "NO", then may proceed with Cephalosporin use.     Social History:   Social  History   Socioeconomic History   Marital status: Married    Spouse name: Not on file   Number of children: Not on file   Years of education: Not on file   Highest education level: Not on file  Occupational History   Occupation: former [employer] fundraiser  Tobacco Use   Smoking status: Former    Current packs/day: 0.00    Average packs/day: 1 pack/day for 16.0 years (16.0 ttl pk-yrs)    Types: Cigarettes    Start date: 06/29/1956    Quit date: 06/29/1972    Years since quitting: 50.6    Passive exposure: Never   Smokeless tobacco: Never  Vaping Use   Vaping status: Never Used  Substance and Sexual Activity   Alcohol use: No   Drug use: No   Sexual activity: Not on file  Other Topics Concern   Not on file  Social History Narrative   Not on file   Social Determinants of Health   Financial Resource Strain: Not on file  Food Insecurity: No Food Insecurity (02/19/2023)   Hunger Vital Sign    Worried About Running Out of Food in the Last Year: Never true    Ran Out of Food in the Last Year: Never true  Transportation Needs: No Transportation Needs (02/19/2023)   PRAPARE - Administrator, Civil Service (Medical): No    Lack of Transportation (Non-Medical): No  Physical Activity: Not on file  Stress: Not on file  Social Connections: Not on file  Intimate Partner Violence: Not At Risk (02/19/2023)   Humiliation, Afraid, Rape, and Kick questionnaire    Fear of Current or Ex-Partner: No    Emotionally Abused: No    Physically Abused: No    Sexually Abused: No    Family History:    Family History  Problem Relation Age of Onset   Heart disease Father    Heart disease Mother      ROS:  Please see the history of present illness.  All other ROS reviewed and negative.     Physical Exam/Data:   Vitals:   02/19/23 0415 02/19/23 0434 02/19/23 0500 02/19/23 0921  BP: (!) 172/74  (!) 167/67 (!) 135/102  Pulse: 63  68 77  Resp: 15  16 18   Temp:  97.8 F (36.6  C) 98.2 F (36.8 C)  98.4 F (36.9 C)  TempSrc:  Oral Oral Oral  SpO2: 96%  99% 98%  Weight:   52.3 kg   Height:   5\' 3"  (1.6 m)     Intake/Output Summary (Last 24 hours) at 02/19/2023 1000 Last data filed at 02/19/2023 0947 Gross per 24 hour  Intake 49.56 ml  Output --  Net 49.56 ml      02/19/2023    5:00 AM 02/19/2023    1:58 AM 10/12/2022    9:36 AM  Last 3 Weights  Weight (lbs) 115 lb 4.8 oz 111 lb 113 lb  Weight (kg) 52.3 kg 50.349 kg 51.256 kg     Body mass index is 20.42 kg/m.  General: Thin WF in no acute distress. Head: Normocephalic, atraumatic, sclera non-icteric, no xanthomas, nares are without discharge. Neck: Negative for carotid bruits. JVP not elevated. Lungs: Clear bilaterally to auscultation without wheezes, rales, or rhonchi. Breathing is unlabored. Heart: RRR S1 S2 without murmurs, rubs, or gallops.  Abdomen: Soft, non-tender, non-distended with normoactive bowel sounds. No rebound/guarding. Extremities: No clubbing or cyanosis. No edema. Distal pedal pulses are 2+ and equal bilaterally. Neuro: Alert and oriented X 3. Moves all extremities spontaneously. Psych:  Responds to questions appropriately with a normal affect.  EKG:  The EKG was personally reviewed and demonstrates:  NSR 73bpm, nonspecific mild sagging V5-V6 otherwise no acute STT changes  Telemetry:  Telemetry was personally reviewed and demonstrates:  NSR occ PVCs  Relevant CV Studies: Cath 2013  Hemodynamics: AO:  138/72   mmHg LV:  135/6    mmHg LVEDP: 11  mmHg   Coronary angiography: Coronary dominance: Codominant.   Left Main:  Normal in size and shows. If this moderately calcified without evidence of obstructive disease. Left Anterior Descending (LAD):  The vessel is heavily calcified in the proximal and midsegment. There is a diffuse 40% stenosis proximally. The mid segment has 20% diffuse disease. There is a 30% tubular stenosis distally. 1st diagonal (D1):  Small in size with mild diffuse  atherosclerosis. 2nd diagonal (D2):  Medium in size with minor irregularities. 3rd diagonal (D3):  Small in size. Circumflex (LCx):  The vessel is normal in size and codominant. The vessel has minor irregularities throughout its course without evidence of active disease. 1st obtuse marginal:  Medium in size with 40% ostial disease. 2nd obtuse marginal:  Medium in size with 40-50% proximal disease.  3rd obtuse marginal:  Medium in size with minor irregularities.    Right Coronary Artery: The vessel has an anterior downward takeoff. It is heavily calcified. It was nonselectively engaged with an AR-1 catheter. It has minor irregularities without evidence of obstructive disease. posterior descending artery: Small in size. posterior lateral branch:  Normal in size with minor irregularities. Left ventriculography: Left ventricular systolic function is normal , LVEF is estimated at 55 %, there is no significant mitral regurgitation . The ascending aorta appears to be mildly dilated.   Final Conclusions:   1. Heavily calcified coronary arteries with moderate nonobstructive coronary artery disease. 2. Normal LV systolic function and filling pressures. 3. Mildly dilated descending aorta.  Recommendations: I recommend aggressive medical therapy. More aggressive treatment of hyperlipidemia might be appropriate with a target LDL of less than 70. Due to her frequent PVCs, I will start her on a beta blocker.    Laboratory Data:  High Sensitivity Troponin:   Recent Labs  Lab 02/19/23 0203 02/19/23 2841 02/19/23 3244 02/19/23 0850  TROPONINIHS 42* 46* 49* 50*     Chemistry Recent Labs  Lab 02/19/23 0203 02/19/23 0356  NA 135  --   K 3.7  --   CL 103  --   CO2 26  --   GLUCOSE 105*  --   BUN 18  --   CREATININE 0.92  --   CALCIUM 9.4  --   MG  --  2.1  GFRNONAA >60  --   ANIONGAP 6  --     Recent Labs  Lab 02/19/23 0203  PROT 7.0  ALBUMIN 4.3  AST 18  ALT 15  ALKPHOS 116  BILITOT  0.5    Hematology Recent Labs  Lab 02/19/23 0203  WBC 5.8  RBC 4.26  HGB 13.5  HCT 41.4  MCV 97.2  MCH 31.7  MCHC 32.6  RDW 13.1  PLT 244    Radiology/Studies:  DG Chest Port 1 View  Result Date: 02/19/2023 CLINICAL DATA:  Central substernal chest pain for 5 days. EXAM: PORTABLE CHEST 1 VIEW COMPARISON:  Chest radiograph 02/20/2017 FINDINGS: Stable cardiomediastinal silhouette. Aortic atherosclerotic calcification. Bibasilar atelectasis. Otherwise no focal consolidation, pleural effusion, or pneumothorax. Chronic bronchitic changes. No displaced rib fractures. IMPRESSION: No acute cardiopulmonary disease. Electronically Signed   By: Minerva Fester M.D.   On: 02/19/2023 02:18     Assessment and Plan:   1. Chest pain concerning for unstable angina, known moderate CAD by cath 2013 - mixed features, though some features are more concerning for anginal pain - ddx includes ACS vs esophageal spasm/GERD - troponins are minimally elevated in gray zone, no other clear explanation driving demand process - patient seen/examined with MD, recommending left heart catheterization for more definitive coronary assessment - cor CTA less likely to be helpful given prior known heavy calcification which could obscure result and we feel she needs more definitive testing - patient agreeable with plan - have reached out to cardmaster to make her aware as well as Dr. Mariea Clonts to help facilitate transfer to [facility] on hospitalist service with cardiology continuing to follow - got ASA 324mg  around 3am and another 81mg  at 956am, continue 81mg  daily tomorrow - resume home metoprolol - see below re: statin - continue IV heparin pending cath - will get limited echo to re-eval LVEF (had full echo 09/2022)  Informed Consent   Shared Decision Making/Informed Consent The risks [stroke (1 in 1000), death (1 in 1000), kidney failure [usually temporary] (1 in 500), bleeding (1 in 200), allergic reaction [possibly  serious] (1 in 200)], benefits (diagnostic support and management of coronary artery disease) and alternatives of a cardiac catheterization were discussed in detail with Ms. Boker and she is willing to proceed.    2. H/o PACs/PVCs - continue home metoprolol  3. Essential HTN - continue metoprolol, ARB (irbesartan is formulary for olmesartan)  4. HLD  - had prior intolerance to atorvastatin so only on rosuvastatin 5mg  every other day - check lipid panel in AM and consider augmentation of therapy if LDL >70  5. PAD - continue ASA, statin  6. Mild AI and mild dilation ascending aorta by echo 09/2022 - continue to follow clinically, dissection felt less likely in setting of current complete resolution of pain   Risk Assessment/Risk Scores:     TIMI Risk Score for Unstable Angina or Non-ST Elevation MI:   The patient's TIMI risk score is 6, which indicates a 41% risk of all cause mortality, new or recurrent myocardial infarction or need for urgent  revascularization in the next 14 days.  For questions or updates, please contact [facility] Please consult [email or web address] for contact info under    Signed, Laurann Montana, PA-C  02/19/2023 10:00 AM   Attending note:  Patient seen and examined.  I reviewed the chart and discussed the case with Ms. Jetta Lout, I agree with her above findings.  Ms. Molette presents for evaluation of recurring chest discomfort since Monday of this week.  Initially described a feeling of midsternal pressure radiating up into the jaw without associated shortness of breath but intermittent diaphoresis.  Episodes occurring after she ate, not specifically with dysphagia or odynophagia however.  Does have known reflux.  Has had subsequent episodes more recently that are nocturnal or at rest and not associated with meals.  She has used nitroglycerin with improvement, thought to herself that this could have been esophageal spasm but since symptoms have been  worsening came in for further assessment.  She is a retired Engineer, civil (consulting), her daughter Mindi Junker is Dr. Odessa Fleming nurse in [place].  She is functional with ADLs and primary caregiver for her husband.  She has a history of heavily calcified coronaries with nonobstructive CAD at cardiac catheterization in 2013, also PAD with stable claudication and status post right popliteal stent intervention in May of last year.  History of PACs and PVCs treated with beta-blocker, also mildly dilated ascending aorta at 4.1 cm by CTA in 2020.  On examination this morning she reports no active symptoms.  She is afebrile, heart rate in the 70s in sinus rhythm, recent blood pressure is 167/67 and 135/102.  Lungs are clear.  Cardiac exam with RRR and 1/6 stock murmur, no gallop.  Distal pulses full.  No peripheral edema.  Pertinent lab work includes potassium 3.7, BUN 18, creatinine 0.92, normal LFTs, high-sensitivity troponin I levels of 49 and 50, hemoglobin 13.5, platelets 244.  ECG shows sinus rhythm with leftward axis and R' in lead V1 and V2.  Chest x-ray reports no acute process.  Patient presents with recurring chest discomfort, mixed features for unstable angina with GI etiology still to be considered, high-sensitivity troponin I levels are not suggestive of ACS, mildly abnormal and in flat pattern.  ECG does not demonstrate acute ST segment changes.  She has a history of heavily calcified coronary arteries without obstruction as of 2013 and more recently a low risk Myoview in March of this year.  We have discussed diagnostic options for further evaluation of her coronaries, unlikely that a follow-up Myoview would be very useful at this time and a coronary CTA would likely be inhibited by substantial coronary calcification.  We discussed the risks and benefits of a diagnostic cardiac catheterization and she is in agreement to proceed.  She will be transferred to [facility] on the hospitalist service with plan for  diagnostic cardiac catheterization today.  Jonelle Sidle, M.D., F.A.C.C.

## 2023-02-19 NOTE — Interval H&P Note (Signed)
History and Physical Interval Note:  02/19/2023 4:42 PM  Heidi Wallace  has presented today for surgery, with the diagnosis of unstable angina.  The various methods of treatment have been discussed with the patient and family. After consideration of risks, benefits and other options for treatment, the patient has consented to  Procedure(s): LEFT HEART CATH AND CORONARY ANGIOGRAPHY (N/A) as a surgical intervention.  The patient's history has been reviewed, patient examined, no change in status, stable for surgery.  I have reviewed the patient's chart and labs.  Questions were answered to the patient's satisfaction.    Cath Lab Visit (complete for each Cath Lab visit)  Clinical Evaluation Leading to the Procedure:   ACS: Yes.    Non-ACS:    Anginal Classification: CCS III  Anti-ischemic medical therapy: Minimal Therapy (1 class of medications)  Non-Invasive Test Results: No non-invasive testing performed  Prior CABG: No previous CABG       Theron Arista [facility] 02/19/2023 4:42 PM

## 2023-02-19 NOTE — H&P (View-Only) (Signed)
Cardiology Consultation   Patient ID: STALEY ZIMAN MRN: [id number]; DOB: 1935-10-03  Admit date: 02/19/2023 Date of Consult: 02/19/2023  PCP:  Estanislado Pandy, MD   [facility] Providers Cardiologist:  Donato Schultz, MD        Patient Profile:   Heidi Wallace is a 87 y.o. 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 Mariea Clonts.  History of Present Illness:   Heidi Wallace had remote cath with findings below in 2013 with moderate nonobstructive CAD with heavily calcified coronaries with scattered 30-50% stenoses in several locations. She also underwent LE stenting by [facility] in 10/2021.  She underwent stress test in 08/2022 for chest pain which was negative for ischemia. 2D echo 09/2022 showed EF 55-60%, G1DD, aortic sclerosis without stenosis, mild AI, mild dilation of ascending aorta 41mm. 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 She is Mindi Junker Robertson's mother-in-law (Dr. Odessa Fleming nurse). She gives the OK to relay info to [employer].  Since Monday 8/19 she has been experiencing midsternal chest pain. This originally seemed to be provoked by meals, lasting a few minutes, but over the last day progressed to happening spontaneously without provocation as well. She began to trial SL NTG and the pain would relieve almost instantaneously. She took 5 tablets yesterday scattered throughout the day. She recalled that NTG could potentially help esophageal spasm so she did not know if it was GI vs cardiac. Overnight she was awakened with substernal chest discomfort with radiation to her jaw so took additional SL NTG and came to the ER  for evaluation. hsTroponins 42->46->49->50. EKG shows NSR with only mild subtle ST sagging V5-V6 but otherwise nonacute. She is currently asymptomatic. She is typically very functional at home and helps to care for her husband who has some dementia. She has some claudication with walking though can go about 20-30 minutes before her legs get tired. She tried walking earlier this week and was able to do so but noticed some increased fatigue towards the end. At [facility] she received 324mg  ASA, NTG paste, and was started on heparin infusion. She has been NPO since arrival.  Past Medical History:  Diagnosis Date   Chest pain    Chronic kidney disease    Coronary artery disease    cardiac cath 08/2011: heavily calcified coronary arteries without obstructive disease. 40% proximal LAD, 40% in OMs.    GERD (gastroesophageal reflux disease)    Headache    History of nuclear stress test 2017   low risk study   Hyperlipidemia    Hypertension    Post concussion syndrome    PVC's (premature ventricular contractions)    PVCs and ventricular bigeminy with abnormal nuclear test    Past Surgical History:  Procedure Laterality Date   ABDOMINAL AORTOGRAM W/LOWER EXTREMITY N/A 11/18/2021   Procedure: ABDOMINAL AORTOGRAM W/LOWER EXTREMITY;  Surgeon: Nada Libman, MD;  Location: [facility];  Service: Cardiovascular;  Laterality: N/A;   BIOPSY  07/07/2018   Procedure: BIOPSY;  Surgeon: Malissa Hippo, MD;  Location: [facility];  Service: Endoscopy;;  gastric polyp   BREAST  CYST EXCISION  benign right breast   1982   CARDIAC CATHETERIZATION  07/2011   [facility]; [facility]   COLONOSCOPY N/A 07/07/2018   Procedure: COLONOSCOPY;  Surgeon: Malissa Hippo, MD;  Location: [facility];  Service: Endoscopy;  Laterality: N/A;  12:25   ESOPHAGOGASTRODUODENOSCOPY N/A 07/07/2018   Procedure: ESOPHAGOGASTRODUODENOSCOPY (EGD);  Surgeon: Malissa Hippo, MD;  Location: [facility];  Service: Endoscopy;  Laterality: N/A;    GALLBLADDER SURGERY  2003   PERIPHERAL VASCULAR INTERVENTION  11/18/2021   Procedure: PERIPHERAL VASCULAR INTERVENTION;  Surgeon: Nada Libman, MD;  Location: [facility];  Service: Cardiovascular;;  right SFA   POLYPECTOMY  07/07/2018   Procedure: POLYPECTOMY;  Surgeon: Malissa Hippo, MD;  Location: [facility];  Service: Endoscopy;;  colon    ROTATOR CUFF REPAIR  right 1989   TUBAL LIGATION  1974     Home Medications:  Prior to Admission medications   Medication Sig Start Date End Date Taking? Authorizing Provider  aspirin EC 81 MG tablet Take 81 mg by mouth in the morning. 07/17/18   Malissa Hippo, MD  cetirizine (ZYRTEC) 10 MG tablet Take 10 mg by mouth daily as needed for allergies (sinus drainage.).     [provider]  Cholecalciferol (VITAMIN D3) 50 MCG (2000 UT) capsule Take 2,000 Units by mouth in the morning.    [provider]  cyclobenzaprine (FLEXERIL) 10 MG tablet Take 5-10 mg by mouth 3 (three) times daily as needed for muscle spasms. 11/01/21   [provider]  desoximetasone (TOPICORT) 0.25 % cream Apply 1 application. topically daily as needed (eczema). 08/11/21   [provider]  esomeprazole (NEXIUM) 40 MG capsule Take 40 mg by mouth daily before breakfast. 11/07/16   [provider]  ezetimibe (ZETIA) 10 MG tablet Take 10 mg by mouth in the morning.    [provider]  gabapentin (NEURONTIN) 300 MG capsule Take 300 mg by mouth daily at 6 PM.    [provider]  meloxicam (MOBIC) 15 MG tablet Take 15 mg by mouth daily. 04/21/22   [provider]  metoprolol succinate (TOPROL-XL) 50 MG 24 hr tablet Take 50 mg by mouth in the morning.    [provider]  nitroGLYCERIN (NITROSTAT) 0.4 MG SL tablet Place 1 tablet (0.4 mg total) under the tongue every 5 (five) minutes as needed for chest pain. 09/17/22   Chandrasekhar, [place] Dragon A, MD  olmesartan (BENICAR) 20 MG tablet Take 1 tablet (20 mg  total) by mouth daily. 01/11/23   Jake Bathe, MD  promethazine (PHENERGAN) 25 MG tablet Take 25 mg by mouth every 4 (four) hours as needed for nausea or vomiting. 11/04/21   [provider]  ROCKLATAN 0.02-0.005 % SOLN Place 1 drop into both eyes at bedtime. 12/09/20   [provider]  rosuvastatin (CRESTOR) 5 MG tablet Take 5 mg by mouth every other day. In the morning    [provider]  sodium chloride (MURO 128) 5 % ophthalmic solution Place 1 drop into both eyes in the morning and at bedtime.    [provider]    Inpatient Medications: Scheduled Meds:  aspirin EC  81 mg Oral q AM   ezetimibe  10 mg Oral q AM   irbesartan  150 mg Oral Daily   metoprolol succinate  50 mg Oral q AM   pantoprazole  40 mg Oral Daily   rosuvastatin  5 mg Oral  QODAY   Continuous Infusions:  heparin Stopped (02/19/23 0626)   PRN Meds: acetaminophen **OR** acetaminophen  Allergies:    Allergies  Allergen Reactions   Clindamycin/Lincomycin Other (See Comments)    c-diff    Levaquin [Levofloxacin In D5w] Itching    At IV site including red streaking   Lipitor [Atorvastatin]     myalgia   Morphine Other (See Comments)    REACTION: Patient states that her mother coded when given this medication therefore patient refuses to take   Quinolones Other (See Comments)    Hx of aortic aneurysm   Ceclor [Cefaclor] Rash   Cephalosporins Rash   Keflex [Cephalexin] Rash   Penicillins Hives and Rash    Has patient had a PCN reaction causing immediate rash, facial/tongue/throat swelling, SOB or lightheadedness with hypotension: Yes Has patient had a PCN reaction causing severe rash involving mucus membranes or skin necrosis: No Has patient had a PCN reaction that required hospitalization: No Has patient had a PCN reaction occurring within the last 10 years: No If all of the above answers are "NO", then may proceed with Cephalosporin use.     Social History:   Social  History   Socioeconomic History   Marital status: Married    Spouse name: Not on file   Number of children: Not on file   Years of education: Not on file   Highest education level: Not on file  Occupational History   Occupation: former [employer] fundraiser  Tobacco Use   Smoking status: Former    Current packs/day: 0.00    Average packs/day: 1 pack/day for 16.0 years (16.0 ttl pk-yrs)    Types: Cigarettes    Start date: 06/29/1956    Quit date: 06/29/1972    Years since quitting: 50.6    Passive exposure: Never   Smokeless tobacco: Never  Vaping Use   Vaping status: Never Used  Substance and Sexual Activity   Alcohol use: No   Drug use: No   Sexual activity: Not on file  Other Topics Concern   Not on file  Social History Narrative   Not on file   Social Determinants of Health   Financial Resource Strain: Not on file  Food Insecurity: No Food Insecurity (02/19/2023)   Hunger Vital Sign    Worried About Running Out of Food in the Last Year: Never true    Ran Out of Food in the Last Year: Never true  Transportation Needs: No Transportation Needs (02/19/2023)   PRAPARE - Administrator, Civil Service (Medical): No    Lack of Transportation (Non-Medical): No  Physical Activity: Not on file  Stress: Not on file  Social Connections: Not on file  Intimate Partner Violence: Not At Risk (02/19/2023)   Humiliation, Afraid, Rape, and Kick questionnaire    Fear of Current or Ex-Partner: No    Emotionally Abused: No    Physically Abused: No    Sexually Abused: No    Family History:    Family History  Problem Relation Age of Onset   Heart disease Father    Heart disease Mother      ROS:  Please see the history of present illness.  All other ROS reviewed and negative.     Physical Exam/Data:   Vitals:   02/19/23 0415 02/19/23 0434 02/19/23 0500 02/19/23 0921  BP: (!) 172/74  (!) 167/67 (!) 135/102  Pulse: 63  68 77  Resp: 15  16 18   Temp:  97.8 F (36.6  C) 98.2 F (36.8 C)  98.4 F (36.9 C)  TempSrc:  Oral Oral Oral  SpO2: 96%  99% 98%  Weight:   52.3 kg   Height:   5\' 3"  (1.6 m)     Intake/Output Summary (Last 24 hours) at 02/19/2023 1000 Last data filed at 02/19/2023 0947 Gross per 24 hour  Intake 49.56 ml  Output --  Net 49.56 ml      02/19/2023    5:00 AM 02/19/2023    1:58 AM 10/12/2022    9:36 AM  Last 3 Weights  Weight (lbs) 115 lb 4.8 oz 111 lb 113 lb  Weight (kg) 52.3 kg 50.349 kg 51.256 kg     Body mass index is 20.42 kg/m.  General: Thin WF in no acute distress. Head: Normocephalic, atraumatic, sclera non-icteric, no xanthomas, nares are without discharge. Neck: Negative for carotid bruits. JVP not elevated. Lungs: Clear bilaterally to auscultation without wheezes, rales, or rhonchi. Breathing is unlabored. Heart: RRR S1 S2 without murmurs, rubs, or gallops.  Abdomen: Soft, non-tender, non-distended with normoactive bowel sounds. No rebound/guarding. Extremities: No clubbing or cyanosis. No edema. Distal pedal pulses are 2+ and equal bilaterally. Neuro: Alert and oriented X 3. Moves all extremities spontaneously. Psych:  Responds to questions appropriately with a normal affect.  EKG:  The EKG was personally reviewed and demonstrates:  NSR 73bpm, nonspecific mild sagging V5-V6 otherwise no acute STT changes  Telemetry:  Telemetry was personally reviewed and demonstrates:  NSR occ PVCs  Relevant CV Studies: Cath 2013  Hemodynamics: AO:  138/72   mmHg LV:  135/6    mmHg LVEDP: 11  mmHg   Coronary angiography: Coronary dominance: Codominant.   Left Main:  Normal in size and shows. If this moderately calcified without evidence of obstructive disease. Left Anterior Descending (LAD):  The vessel is heavily calcified in the proximal and midsegment. There is a diffuse 40% stenosis proximally. The mid segment has 20% diffuse disease. There is a 30% tubular stenosis distally. 1st diagonal (D1):  Small in size with mild diffuse  atherosclerosis. 2nd diagonal (D2):  Medium in size with minor irregularities. 3rd diagonal (D3):  Small in size. Circumflex (LCx):  The vessel is normal in size and codominant. The vessel has minor irregularities throughout its course without evidence of active disease. 1st obtuse marginal:  Medium in size with 40% ostial disease. 2nd obtuse marginal:  Medium in size with 40-50% proximal disease.  3rd obtuse marginal:  Medium in size with minor irregularities.    Right Coronary Artery: The vessel has an anterior downward takeoff. It is heavily calcified. It was nonselectively engaged with an AR-1 catheter. It has minor irregularities without evidence of obstructive disease. posterior descending artery: Small in size. posterior lateral branch:  Normal in size with minor irregularities. Left ventriculography: Left ventricular systolic function is normal , LVEF is estimated at 55 %, there is no significant mitral regurgitation . The ascending aorta appears to be mildly dilated.   Final Conclusions:   1. Heavily calcified coronary arteries with moderate nonobstructive coronary artery disease. 2. Normal LV systolic function and filling pressures. 3. Mildly dilated descending aorta.  Recommendations: I recommend aggressive medical therapy. More aggressive treatment of hyperlipidemia might be appropriate with a target LDL of less than 70. Due to her frequent PVCs, I will start her on a beta blocker.    Laboratory Data:  High Sensitivity Troponin:   Recent Labs  Lab 02/19/23 0203 02/19/23 2841 02/19/23 3244 02/19/23 0850  TROPONINIHS 42* 46* 49* 50*     Chemistry Recent Labs  Lab 02/19/23 0203 02/19/23 0356  NA 135  --   K 3.7  --   CL 103  --   CO2 26  --   GLUCOSE 105*  --   BUN 18  --   CREATININE 0.92  --   CALCIUM 9.4  --   MG  --  2.1  GFRNONAA >60  --   ANIONGAP 6  --     Recent Labs  Lab 02/19/23 0203  PROT 7.0  ALBUMIN 4.3  AST 18  ALT 15  ALKPHOS 116  BILITOT  0.5    Hematology Recent Labs  Lab 02/19/23 0203  WBC 5.8  RBC 4.26  HGB 13.5  HCT 41.4  MCV 97.2  MCH 31.7  MCHC 32.6  RDW 13.1  PLT 244    Radiology/Studies:  DG Chest Port 1 View  Result Date: 02/19/2023 CLINICAL DATA:  Central substernal chest pain for 5 days. EXAM: PORTABLE CHEST 1 VIEW COMPARISON:  Chest radiograph 02/20/2017 FINDINGS: Stable cardiomediastinal silhouette. Aortic atherosclerotic calcification. Bibasilar atelectasis. Otherwise no focal consolidation, pleural effusion, or pneumothorax. Chronic bronchitic changes. No displaced rib fractures. IMPRESSION: No acute cardiopulmonary disease. Electronically Signed   By: Minerva Fester M.D.   On: 02/19/2023 02:18     Assessment and Plan:   1. Chest pain concerning for unstable angina, known moderate CAD by cath 2013 - mixed features, though some features are more concerning for anginal pain - ddx includes ACS vs esophageal spasm/GERD - troponins are minimally elevated in gray zone, no other clear explanation driving demand process - patient seen/examined with MD, recommending left heart catheterization for more definitive coronary assessment - cor CTA less likely to be helpful given prior known heavy calcification which could obscure result and we feel she needs more definitive testing - patient agreeable with plan - have reached out to cardmaster to make her aware as well as Dr. Mariea Clonts to help facilitate transfer to [facility] on hospitalist service with cardiology continuing to follow - got ASA 324mg  around 3am and another 81mg  at 956am, continue 81mg  daily tomorrow - resume home metoprolol - see below re: statin - continue IV heparin pending cath - will get limited echo to re-eval LVEF (had full echo 09/2022)  Informed Consent   Shared Decision Making/Informed Consent The risks [stroke (1 in 1000), death (1 in 1000), kidney failure [usually temporary] (1 in 500), bleeding (1 in 200), allergic reaction [possibly  serious] (1 in 200)], benefits (diagnostic support and management of coronary artery disease) and alternatives of a cardiac catheterization were discussed in detail with Heidi Wallace and she is willing to proceed.    2. H/o PACs/PVCs - continue home metoprolol  3. Essential HTN - continue metoprolol, ARB (irbesartan is formulary for olmesartan)  4. HLD  - had prior intolerance to atorvastatin so only on rosuvastatin 5mg  every other day - check lipid panel in AM and consider augmentation of therapy if LDL >70  5. PAD - continue ASA, statin  6. Mild AI and mild dilation ascending aorta by echo 09/2022 - continue to follow clinically, dissection felt less likely in setting of current complete resolution of pain   Risk Assessment/Risk Scores:     TIMI Risk Score for Unstable Angina or Non-ST Elevation MI:   The patient's TIMI risk score is 6, which indicates a 41% risk of all cause mortality, new or recurrent myocardial infarction or need for urgent  revascularization in the next 14 days.  For questions or updates, please contact [facility] Please consult [email or web address] for contact info under    Signed, Laurann Montana, PA-C  02/19/2023 10:00 AM   Attending note:  Patient seen and examined.  I reviewed the chart and discussed the case with Heidi Wallace, I agree with her above findings.  Heidi Wallace presents for evaluation of recurring chest discomfort since Monday of this week.  Initially described a feeling of midsternal pressure radiating up into the jaw without associated shortness of breath but intermittent diaphoresis.  Episodes occurring after she ate, not specifically with dysphagia or odynophagia however.  Does have known reflux.  Has had subsequent episodes more recently that are nocturnal or at rest and not associated with meals.  She has used nitroglycerin with improvement, thought to herself that this could have been esophageal spasm but since symptoms have been  worsening came in for further assessment.  She is a retired Engineer, civil (consulting), her daughter Mindi Junker is Dr. Odessa Fleming nurse in [place].  She is functional with ADLs and primary caregiver for her husband.  She has a history of heavily calcified coronaries with nonobstructive CAD at cardiac catheterization in 2013, also PAD with stable claudication and status post right popliteal stent intervention in May of last year.  History of PACs and PVCs treated with beta-blocker, also mildly dilated ascending aorta at 4.1 cm by CTA in 2020.  On examination this morning she reports no active symptoms.  She is afebrile, heart rate in the 70s in sinus rhythm, recent blood pressure is 167/67 and 135/102.  Lungs are clear.  Cardiac exam with RRR and 1/6 stock murmur, no gallop.  Distal pulses full.  No peripheral edema.  Pertinent lab work includes potassium 3.7, BUN 18, creatinine 0.92, normal LFTs, high-sensitivity troponin I levels of 49 and 50, hemoglobin 13.5, platelets 244.  ECG shows sinus rhythm with leftward axis and R' in lead V1 and V2.  Chest x-ray reports no acute process.  Patient presents with recurring chest discomfort, mixed features for unstable angina with GI etiology still to be considered, high-sensitivity troponin I levels are not suggestive of ACS, mildly abnormal and in flat pattern.  ECG does not demonstrate acute ST segment changes.  She has a history of heavily calcified coronary arteries without obstruction as of 2013 and more recently a low risk Myoview in March of this year.  We have discussed diagnostic options for further evaluation of her coronaries, unlikely that a follow-up Myoview would be very useful at this time and a coronary CTA would likely be inhibited by substantial coronary calcification.  We discussed the risks and benefits of a diagnostic cardiac catheterization and she is in agreement to proceed.  She will be transferred to [facility] on the hospitalist service with plan for  diagnostic cardiac catheterization today.  Jonelle Sidle, M.D., F.A.C.C.

## 2023-02-19 NOTE — Plan of Care (Signed)
  Problem: Education: Goal: Knowledge of General Education information will improve Description: Including pain rating scale, medication(s)/side effects and non-pharmacologic comfort measures 02/19/2023 1209 by Laurena Spies, LPN Outcome: Progressing 02/19/2023 1209 by Laurena Spies, LPN Outcome: Progressing   Problem: Health Behavior/Discharge Planning: Goal: Ability to manage health-related needs will improve 02/19/2023 1209 by Laurena Spies, LPN Outcome: Progressing 02/19/2023 1209 by Laurena Spies, LPN Outcome: Progressing   Problem: Clinical Measurements: Goal: Ability to maintain clinical measurements within normal limits will improve 02/19/2023 1209 by Laurena Spies, LPN Outcome: Progressing 02/19/2023 1209 by Laurena Spies, LPN Outcome: Progressing Goal: Will remain free from infection 02/19/2023 1209 by Laurena Spies, LPN Outcome: Progressing 02/19/2023 1209 by Laurena Spies, LPN Outcome: Progressing Goal: Diagnostic test results will improve 02/19/2023 1209 by Laurena Spies, LPN Outcome: Progressing 02/19/2023 1209 by Laurena Spies, LPN Outcome: Progressing Goal: Respiratory complications will improve 02/19/2023 1209 by Laurena Spies, LPN Outcome: Progressing 02/19/2023 1209 by Laurena Spies, LPN Outcome: Progressing Goal: Cardiovascular complication will be avoided Outcome: Progressing   Problem: Activity: Goal: Risk for activity intolerance will decrease Outcome: Progressing   Problem: Nutrition: Goal: Adequate nutrition will be maintained Outcome: Progressing   Problem: Coping: Goal: Level of anxiety will decrease Outcome: Progressing   Problem: Elimination: Goal: Will not experience complications related to bowel motility Outcome: Progressing Goal: Will not experience complications related to urinary retention Outcome: Progressing   Problem: Pain Managment: Goal: General experience of comfort will improve Outcome: Progressing   Problem: Safety: Goal: Ability to  remain free from injury will improve Outcome: Progressing   Problem: Skin Integrity: Goal: Risk for impaired skin integrity will decrease Outcome: Progressing   Problem: Education: Goal: Understanding of CV disease, CV risk reduction, and recovery process will improve Outcome: Progressing Goal: Individualized Educational Video(s) Outcome: Progressing   Problem: Activity: Goal: Ability to return to baseline activity level will improve Outcome: Progressing   Problem: Cardiovascular: Goal: Ability to achieve and maintain adequate cardiovascular perfusion will improve Outcome: Progressing Goal: Vascular access site(s) Level 0-1 will be maintained Outcome: Progressing   Problem: Health Behavior/Discharge Planning: Goal: Ability to safely manage health-related needs after discharge will improve Outcome: Progressing

## 2023-02-19 NOTE — Progress Notes (Signed)
ANTICOAGULATION CONSULT NOTE -   Pharmacy Consult for Heparin  Indication: chest pain/ACS  Allergies  Allergen Reactions   Clindamycin/Lincomycin Other (See Comments)    c-diff    Levaquin [Levofloxacin In D5w] Itching    At IV site including red streaking   Lipitor [Atorvastatin]     myalgia   Morphine Other (See Comments)    REACTION: Patient states that her mother coded when given this medication therefore patient refuses to take   Quinolones Other (See Comments)    Hx of aortic aneurysm   Ceclor [Cefaclor] Rash   Cephalosporins Rash   Keflex [Cephalexin] Rash   Penicillins Hives and Rash   Patient Measurements: Height: 5\' 3"  (160 cm) Weight: 49.1 kg (108 lb 3.2 oz) IBW/kg (Calculated) : 52.4  Vital Signs: Temp: 98.4 F (36.9 C) (08/23 0921) Temp Source: Oral (08/23 0921) BP: 135/102 (08/23 0921) Pulse Rate: 77 (08/23 0921)  Labs: Recent Labs    02/19/23 0203 02/19/23 0356 02/19/23 0642 02/19/23 0850 02/19/23 1055  HGB 13.5  --   --   --   --   HCT 41.4  --   --   --   --   PLT 244  --   --   --   --   HEPARINUNFRC  --   --   --   --  0.48  CREATININE 0.92  --   --   --   --   TROPONINIHS 42* 46* 49* 50*  --     Estimated Creatinine Clearance: 34.7 mL/min (by C-G formula based on SCr of 0.92 mg/dL).   Medical History: Past Medical History:  Diagnosis Date   Chest pain    Chronic kidney disease    Coronary artery disease    cardiac cath 08/2011: heavily calcified coronary arteries without obstructive disease. 40% proximal LAD, 40% in OMs.    GERD (gastroesophageal reflux disease)    Headache    History of nuclear stress test 2017   low risk study   Hyperlipidemia    Hypertension    Post concussion syndrome    PVC's (premature ventricular contractions)    PVCs and ventricular bigeminy with abnormal nuclear test    Assessment: 87 y/o F with chest pain x 5 days. Small elevated in high sensitivity troponin. Starting heparin. Labs above reviewed.  PTA meds reviewed.   HL 0.48- therapeutic CBC WNL Trop 50  Goal of Therapy:  Heparin level 0.3-0.7 units/ml Monitor platelets by anticoagulation protocol: Yes   Plan:  Continue heparin infusion at 600 units/hr Heparin level in 8 hours and daily Monitor for bleeding  Judeth Cornfield, PharmD Clinical Pharmacist 02/19/2023 12:29 PM

## 2023-02-19 NOTE — ED Notes (Signed)
Admitting provider at bedside. Updated that pt had recurrent centralized chest pain that lasted 1-2 minutes. Pain non-radiating. No associated s/s. Pending delta trop

## 2023-02-19 NOTE — ED Triage Notes (Signed)
Pt in with central substernal chest pain x 5 days. Pt states it began with what she felt as burning/reflux symptoms. Pt states she has taken NTG 5 times today, took last dose 1hr PTA. Arrives pain free, denies any n/v or sob. Pain worse after eating

## 2023-02-20 ENCOUNTER — Inpatient Hospital Stay (HOSPITAL_COMMUNITY): Payer: Medicare PPO

## 2023-02-20 DIAGNOSIS — Z8249 Family history of ischemic heart disease and other diseases of the circulatory system: Secondary | ICD-10-CM | POA: Diagnosis not present

## 2023-02-20 DIAGNOSIS — Z881 Allergy status to other antibiotic agents status: Secondary | ICD-10-CM | POA: Diagnosis not present

## 2023-02-20 DIAGNOSIS — I2510 Atherosclerotic heart disease of native coronary artery without angina pectoris: Secondary | ICD-10-CM | POA: Diagnosis present

## 2023-02-20 DIAGNOSIS — Z885 Allergy status to narcotic agent status: Secondary | ICD-10-CM | POA: Diagnosis not present

## 2023-02-20 DIAGNOSIS — Z8679 Personal history of other diseases of the circulatory system: Secondary | ICD-10-CM | POA: Diagnosis not present

## 2023-02-20 DIAGNOSIS — R011 Cardiac murmur, unspecified: Secondary | ICD-10-CM | POA: Diagnosis present

## 2023-02-20 DIAGNOSIS — Z791 Long term (current) use of non-steroidal anti-inflammatories (NSAID): Secondary | ICD-10-CM | POA: Diagnosis not present

## 2023-02-20 DIAGNOSIS — I200 Unstable angina: Secondary | ICD-10-CM

## 2023-02-20 DIAGNOSIS — Z79899 Other long term (current) drug therapy: Secondary | ICD-10-CM | POA: Diagnosis not present

## 2023-02-20 DIAGNOSIS — Z7982 Long term (current) use of aspirin: Secondary | ICD-10-CM | POA: Diagnosis not present

## 2023-02-20 DIAGNOSIS — Z880 Allergy status to penicillin: Secondary | ICD-10-CM | POA: Diagnosis not present

## 2023-02-20 DIAGNOSIS — E782 Mixed hyperlipidemia: Secondary | ICD-10-CM | POA: Diagnosis present

## 2023-02-20 DIAGNOSIS — Z8719 Personal history of other diseases of the digestive system: Secondary | ICD-10-CM | POA: Diagnosis not present

## 2023-02-20 DIAGNOSIS — I10 Essential (primary) hypertension: Secondary | ICD-10-CM | POA: Diagnosis present

## 2023-02-20 DIAGNOSIS — Z888 Allergy status to other drugs, medicaments and biological substances status: Secondary | ICD-10-CM | POA: Diagnosis not present

## 2023-02-20 DIAGNOSIS — I700 Atherosclerosis of aorta: Secondary | ICD-10-CM | POA: Diagnosis present

## 2023-02-20 DIAGNOSIS — Z9851 Tubal ligation status: Secondary | ICD-10-CM | POA: Diagnosis not present

## 2023-02-20 DIAGNOSIS — I2584 Coronary atherosclerosis due to calcified coronary lesion: Secondary | ICD-10-CM | POA: Diagnosis present

## 2023-02-20 DIAGNOSIS — I7121 Aneurysm of the ascending aorta, without rupture: Secondary | ICD-10-CM | POA: Diagnosis present

## 2023-02-20 DIAGNOSIS — I739 Peripheral vascular disease, unspecified: Secondary | ICD-10-CM | POA: Diagnosis present

## 2023-02-20 DIAGNOSIS — I214 Non-ST elevation (NSTEMI) myocardial infarction: Secondary | ICD-10-CM | POA: Diagnosis present

## 2023-02-20 DIAGNOSIS — Z9889 Other specified postprocedural states: Secondary | ICD-10-CM | POA: Diagnosis not present

## 2023-02-20 DIAGNOSIS — K219 Gastro-esophageal reflux disease without esophagitis: Secondary | ICD-10-CM | POA: Diagnosis present

## 2023-02-20 DIAGNOSIS — I493 Ventricular premature depolarization: Secondary | ICD-10-CM | POA: Diagnosis present

## 2023-02-20 DIAGNOSIS — Z87891 Personal history of nicotine dependence: Secondary | ICD-10-CM | POA: Diagnosis not present

## 2023-02-20 LAB — COMPREHENSIVE METABOLIC PANEL
ALT: 65 U/L — ABNORMAL HIGH (ref 0–44)
AST: 73 U/L — ABNORMAL HIGH (ref 15–41)
Albumin: 3.3 g/dL — ABNORMAL LOW (ref 3.5–5.0)
Alkaline Phosphatase: 108 U/L (ref 38–126)
Anion gap: 11 (ref 5–15)
BUN: 9 mg/dL (ref 8–23)
CO2: 20 mmol/L — ABNORMAL LOW (ref 22–32)
Calcium: 8.9 mg/dL (ref 8.9–10.3)
Chloride: 104 mmol/L (ref 98–111)
Creatinine, Ser: 0.83 mg/dL (ref 0.44–1.00)
GFR, Estimated: >60 mL/min (ref >60)
Glucose, Bld: 106 mg/dL — ABNORMAL HIGH (ref 70–99)
Potassium: 3.2 mmol/L — ABNORMAL LOW (ref 3.5–5.1)
Sodium: 135 mmol/L (ref 135–145)
Total Bilirubin: 0.8 mg/dL (ref 0.3–1.2)
Total Protein: 5.3 g/dL — ABNORMAL LOW (ref 6.5–8.1)

## 2023-02-20 LAB — ECHOCARDIOGRAM LIMITED
Area-P 1/2: 2.76 cm2
Height: 63 in
P 1/2 time: 731 ms
S' Lateral: 3 cm
Weight: 1728 [oz_av]

## 2023-02-20 LAB — CBC
HCT: 35.8 % — ABNORMAL LOW (ref 36.0–46.0)
Hemoglobin: 11.9 g/dL — ABNORMAL LOW (ref 12.0–15.0)
MCH: 31 pg (ref 26.0–34.0)
MCHC: 33.2 g/dL (ref 30.0–36.0)
MCV: 93.2 fL (ref 80.0–100.0)
Platelets: 184 10*3/uL (ref 150–400)
RBC: 3.84 MIL/uL — ABNORMAL LOW (ref 3.87–5.11)
RDW: 13 % (ref 11.5–15.5)
WBC: 5.5 10*3/uL (ref 4.0–10.5)
nRBC: 0 % (ref 0.0–0.2)

## 2023-02-20 LAB — LIPID PANEL
Cholesterol: 170 mg/dL (ref 0–200)
HDL: 70 mg/dL (ref >40)
LDL Cholesterol: 88 mg/dL (ref 0–99)
Total CHOL/HDL Ratio: 2.4 ratio
Triglycerides: 62 mg/dL (ref <150)
VLDL: 12 mg/dL (ref 0–40)

## 2023-02-20 MED ORDER — TICAGRELOR 90 MG PO TABS: 90.0000 mg | ORAL_TABLET | Freq: Two times a day (BID) | ORAL | 0 refills | Status: DC

## 2023-02-20 MED ORDER — POTASSIUM CHLORIDE CRYS ER 20 MEQ PO TBCR
40.0000 meq | EXTENDED_RELEASE_TABLET | ORAL | Status: AC
  Administered 2023-02-20 (×2): 40 meq via ORAL
  Filled 2023-02-20 (×2): qty 2

## 2023-02-20 NOTE — Progress Notes (Signed)
Rounding Note    Patient Name: Heidi Wallace Date of Encounter: 02/20/2023  [facility] Cardiologist: Donato Schultz, MD   Subjective   BP 148/85, creatinine 0.8, hemoglobin 11.9.  Denies any chest pain or dyspnea  Inpatient Medications    Scheduled Meds:  aspirin EC  81 mg Oral q AM   enoxaparin (LOVENOX) injection  40 mg Subcutaneous Q24H   ezetimibe  10 mg Oral q AM   gabapentin  300 mg Oral q1800   irbesartan  150 mg Oral Daily   metoprolol succinate  50 mg Oral Daily   pantoprazole  40 mg Oral Daily   potassium chloride  40 mEq Oral Q4H   rosuvastatin  5 mg Oral QODAY   sodium chloride flush  3 mL Intravenous Q12H   ticagrelor  90 mg Oral BID   Continuous Infusions:  sodium chloride     PRN Meds: sodium chloride, acetaminophen, hydrALAZINE, ondansetron (ZOFRAN) IV, sodium chloride flush   Vital Signs    Vitals:   02/19/23 2109 02/20/23 0029 02/20/23 0430 02/20/23 0829  BP: (!) 161/84 127/77 (P) 117/64 (!) 148/85  Pulse: 81 71  74  Resp: 16 16 (P) 16 17  Temp: (!) 97.4 F (36.3 C)   97.7 F (36.5 C)  TempSrc: Oral  (P) Oral Oral  SpO2: 100% 99%  100%  Weight:      Height:        Intake/Output Summary (Last 24 hours) at 02/20/2023 0911 Last data filed at 02/19/2023 1309 Gross per 24 hour  Intake 0 ml  Output --  Net 0 ml      02/19/2023    7:00 PM 02/19/2023    6:37 PM 02/19/2023   10:05 AM  Last 3 Weights  Weight (lbs) 108 lb 42 lb 4.2 oz 108 lb 3.2 oz  Weight (kg) 48.988 kg 19.17 kg 49.079 kg      Telemetry    Normal sinus rhythm- Personally Reviewed  ECG    Normal sinus rhythm, T wave inversions V1-4, less than 1 mm ST depressions V3/4- Personally Reviewed  Physical Exam   GEN: No acute distress.   Neck: No JVD Cardiac: RRR, no murmurs, rubs, or gallops.  Respiratory: Clear to auscultation bilaterally. GI: Soft, nontender, non-distended  MS: No edema; No deformity. Neuro:  Nonfocal  Psych: Normal affect   Labs     High Sensitivity Troponin:   Recent Labs  Lab 02/19/23 0203 02/19/23 0356 02/19/23 0642 02/19/23 0850  TROPONINIHS 42* 46* 49* 50*     Chemistry Recent Labs  Lab 02/19/23 0203 02/19/23 0356 02/19/23 1917 02/20/23 0350  NA 135  --   --  135  K 3.7  --   --  3.2*  CL 103  --   --  104  CO2 26  --   --  20*  GLUCOSE 105*  --   --  106*  BUN 18  --   --  9  CREATININE 0.92  --  0.79 0.83  CALCIUM 9.4  --   --  8.9  MG  --  2.1  --   --   PROT 7.0  --   --  5.3*  ALBUMIN 4.3  --   --  3.3*  AST 18  --   --  73*  ALT 15  --   --  65*  ALKPHOS 116  --   --  108  BILITOT 0.5  --   --  0.8  GFRNONAA >60  --  >60 >60  ANIONGAP 6  --   --  11    Lipids  Recent Labs  Lab 02/20/23 0350  CHOL 170  TRIG 62  HDL 70  LDLCALC 88  CHOLHDL 2.4    Hematology Recent Labs  Lab 02/19/23 0203 02/20/23 0350  WBC 5.8 5.5  RBC 4.26 3.84*  HGB 13.5 11.9*  HCT 41.4 35.8*  MCV 97.2 93.2  MCH 31.7 31.0  MCHC 32.6 33.2  RDW 13.1 13.0  PLT 244 184   Thyroid No results for input(s): "TSH", "FREET4" in the last 168 hours.  BNPNo results for input(s): "BNP", "PROBNP" in the last 168 hours.  DDimer No results for input(s): "DDIMER" in the last 168 hours.   Radiology    CARDIAC CATHETERIZATION  Result Date: 02/19/2023   Prox LAD lesion is 90% stenosed.   2nd Mrg lesion is 85% stenosed.   A drug-eluting stent was successfully placed using a STENT ONYX FRONTIER 2.5X12.   Post intervention, there is a 0% residual stenosis.   The left ventricular systolic function is normal.   LV end diastolic pressure is normal.   The left ventricular ejection fraction is greater than 65% by visual estimate. 2 vessel obstructive CAD with 90% proximal LAD and 85% small caliber OM2 Normal LV function Normal LVEDP Successful PCI of the proximal LAD with DES Plan: DAPT for one year. Anticipate DC in am. Given small vessel size and tortuosity I would treat the OM disease medically.   DG Chest Port 1  View  Result Date: 02/19/2023 CLINICAL DATA:  Central substernal chest pain for 5 days. EXAM: PORTABLE CHEST 1 VIEW COMPARISON:  Chest radiograph 02/20/2017 FINDINGS: Stable cardiomediastinal silhouette. Aortic atherosclerotic calcification. Bibasilar atelectasis. Otherwise no focal consolidation, pleural effusion, or pneumothorax. Chronic bronchitic changes. No displaced rib fractures. IMPRESSION: No acute cardiopulmonary disease. Electronically Signed   By: Minerva Fester M.D.   On: 02/19/2023 02:18    Cardiac Studies     Patient Profile     87 y.o. female with a hx of moderate nonobstructive CAD by remote cath 2013, PAD with h/o critical limb ischemia and R popliteal stenting 10/2021, HTN, HLD, mild AI, PACs, PVCs,  ascending aortic aneurysm (4.1cm by CTA 04/2019, 4.1cm by echo 09/2022), GERD who is being seen 02/19/2023 for the evaluation of chest pain   Assessment & Plan    NSTEMI: Presented with chest pain, mild troponin elevation.  Cath 8/23 showed 90% proximal LAD stenosis status post DES.  Also with 85% OM 2 lesion, but small vessel, medical management recommended.  Currently chest pain-free -Check echocardiogram -Continue aspirin 81 mg daily, ticagrelor 90 mg twice daily -Continue rosuvastatin 5 mg daily and Zetia 10 mg daily -Continue Toprol-XL 50 mg daily  Hypertension: Continue metoprolol and irbesartan  Hyperlipidemia: Continue rosuvastatin and Zetia.  LDL 88.  Reports unable to tolerate higher doses of statin.  Would plan outpatient referral to lipid clinic  Disposition: should be OK for discharge today following echocardiogram.  Will schedule outpatient f/u   For questions or updates, please contact [facility] Please consult [email or web address] for contact info under        Signed, Little Ishikawa, MD  02/20/2023, 9:11 AM

## 2023-02-20 NOTE — Plan of Care (Signed)
Patient will discharge home.  No active problems reported at this time.  Right radial catheterization site is level 0 with no active bleeding or hematoma.  Patient/family educated at bedside related to post-cath care instructions, medications, activity progression and precautions; verbalized understanding.

## 2023-02-20 NOTE — Progress Notes (Addendum)
CARDIAC REHAB PHASE I   PRE:  Rate/Rhythm: 86/ NSR  BP:  Sitting: 148/85       MODE:  Ambulation: 470 ft   POST:  Rate/Rhythm: 116/ ST  BP:  Sitting: 158/84        Pt tolerated exercise well and amb 470 ft with stand-by assist. Pt denies CP, SOB, or dizziness throughout walk. Pt placed back in bed with call bell in reach. Will continue to follow.  Pt educated on Antiplatelet use, risk factors, exercise, restrictions and heart healthy diet.   [id number] Guss Bunde, RRT 02/20/2023 9:13 AM

## 2023-02-20 NOTE — Progress Notes (Signed)
TR band deflated at 0035. Taken off at 0135. Pressure was held for 5 minutes due to oozing. Site dressed with gauze and tegaderm. Reassessed site at 0200 and blood was present on gauze so pressure was held for another 5 minutes and site was redressed with gauze and tegaderm. Will continue to monitor.

## 2023-02-20 NOTE — Discharge Summary (Signed)
Physician Discharge Summary  Heidi Wallace:[id number] DOB: 11-20-1935 DOA: 02/19/2023  PCP: Estanislado Pandy, MD  Admit date: 02/19/2023 Discharge date: 02/20/2023 30 Day Unplanned Readmission Risk Score    Flowsheet Row ED to Hosp-Admission (Current) from 02/19/2023 in [facility]  30 Day Unplanned Readmission Risk Score (%) 10.88 Filed at 02/20/2023 1200       This score is the patient's risk of an unplanned readmission within 30 days of being discharged (0 -100%). The score is based on dignosis, age, lab data, medications, orders, and past utilization.   Low:  0-14.9   Medium: 15-21.9   High: 22-29.9   Extreme: 30 and above          Admitted From: Home Disposition: Home  Recommendations for Outpatient Follow-up:  Follow up with PCP in 1-2 weeks Please obtain BMP/CBC in one week Follow-up with cardiology-they will schedule outpatient follow-up Please follow up with your PCP on the following pending results: Unresulted Labs (From admission, onward)     Start     Ordered   02/26/23 0500  Creatinine, serum  (enoxaparin (LOVENOX)    CrCl >/= 30 ml/min)  Weekly,   R     Comments: while on enoxaparin therapy   Question:  Specimen collection method  Answer:  Lab=Lab collect   02/19/23 1904   02/20/23 0500  Lipoprotein A (LPA)  Tomorrow morning,   R       Question:  Specimen collection method  Answer:  Lab=Lab collect   02/19/23 1904   Signed and Held  Basic metabolic panel  Tomorrow morning,   R       Question:  Specimen collection method  Answer:  Lab=Lab collect   Signed and Held   Signed and Held  CBC  Tomorrow morning,   R       Question:  Specimen collection method  Answer:  Lab=Lab collect   Signed and Held   Signed and Held  CBC  (enoxaparin (LOVENOX)    CrCl >/= 30 ml/min)  Once,   R       Comments: Baseline for enoxaparin therapy IF NOT ALREADY DRAWN.  Notify MD if PLT < 100 K.   Question:  Specimen collection method  Answer:  Lab=Lab collect    Signed and Held              Home Health: None Equipment/Devices: None  Discharge Condition: stable CODE STATUS: Full code Diet recommendation: Cardiac  Subjective: Seen and examined.  No complaints.  Brief/Interim Summary: 87 y.o. female with medical history significant of GERD, hypertension, hyperlipidemia, and PAD who was admitted on 02/19/2023 with midsternal intermittent chest pain that radiates to the jaws and to left upper extremity.  Chest pain was alleviated with nitroglycerin which she took several times daily since onset of symptoms a few days ago. -Recent cardiovascular nuclear stress test from 09/23/2022 noted -TTE from 10/27/2022 noted with EF of 55 to 50% and grade 1 diastolic dysfunction.  Patient initially admitted at [facility] hospital, seen by cardiology, per their recommendation, transferred to [facility] and underwent cardiac cath on 02/19/2023 which showed 90% proximal LAD stenosis status post DES.  Also with 85% OM 2 lesion, but small vessel, medical management recommended.  Currently chest pain-free.  Echo shows normal ejection fraction with normal diastolic function.  Seen and cleared by cardiology with recommendations to discharge on aspirin and Brilinta as well as statin.  Patient stable.  Discharge plan was  discussed with patient and/or family member and they verbalized understanding and agreed with it.  Discharge Diagnoses:  Principal Problem:   Chest pain Active Problems:   GERD (gastroesophageal reflux disease)   Essential hypertension   Mixed hyperlipidemia   Coronary artery disease   Peripheral arterial disease (HCC)   Non-ST elevation (NSTEMI) myocardial infarction [facility])   CAD (coronary artery disease)    Discharge Instructions  Discharge Instructions     Amb Referral to Cardiac Rehabilitation   Complete by: As directed    Diagnosis: PTCA   After initial evaluation and assessments completed: Virtual Based Care may be provided alone or in  conjunction with Phase 2 Cardiac Rehab based on patient barriers.: Yes   Intensive Cardiac Rehabilitation (ICR) [facility] location only OR Traditional Cardiac Rehabilitation (TCR) *If criteria for ICR are not met will enroll in TCR [facility] only): Yes      Allergies as of 02/20/2023       Reactions   Clindamycin/lincomycin Other (See Comments)   c-diff    Levaquin [levofloxacin In D5w] Itching   At IV site including red streaking   Lipitor [atorvastatin]    myalgia   Morphine Other (See Comments)   REACTION: Patient states that her mother coded when given this medication therefore patient refuses to take   Quinolones Other (See Comments)   Hx of aortic aneurysm   Ceclor [cefaclor] Rash   Cephalosporins Rash   Keflex [cephalexin] Rash   Penicillins Hives, Rash        Medication List     TAKE these medications    aspirin EC 81 MG tablet Take 81 mg by mouth in the morning.   esomeprazole 40 MG capsule Commonly known as: NEXIUM Take 40 mg by mouth daily before breakfast.   ezetimibe 10 MG tablet Commonly known as: ZETIA Take 10 mg by mouth in the morning.   gabapentin 300 MG capsule Commonly known as: NEURONTIN Take 300 mg by mouth daily at 6 PM.   meloxicam 15 MG tablet Commonly known as: MOBIC Take 15 mg by mouth daily as needed for pain.   metoprolol succinate 50 MG 24 hr tablet Commonly known as: TOPROL-XL Take 50 mg by mouth every evening.   nitroGLYCERIN 0.4 MG SL tablet Commonly known as: NITROSTAT Place 1 tablet (0.4 mg total) under the tongue every 5 (five) minutes as needed for chest pain.   olmesartan 20 MG tablet Commonly known as: BENICAR Take 1 tablet (20 mg total) by mouth daily.   Rocklatan 0.02-0.005 % Soln Generic drug: Netarsudil-Latanoprost Place 1 drop into both eyes at bedtime.   rosuvastatin 5 MG tablet Commonly known as: CRESTOR Take 5 mg by mouth every other day. In the morning   ticagrelor 90 MG Tabs tablet Commonly known as:  BRILINTA Take 1 tablet (90 mg total) by mouth 2 (two) times daily.   Vitamin D3 50 MCG (2000 UT) capsule Take 1,000 Units by mouth in the morning.        Follow-up Information     Sasser, Clarene Critchley, MD Follow up in 1 week(s).   Specialty: Family Medicine Contact information: [address] [place] [address] [phone]                Allergies  Allergen Reactions   Clindamycin/Lincomycin Other (See Comments)    c-diff    Levaquin [Levofloxacin In D5w] Itching    At IV site including red streaking   Lipitor [Atorvastatin]     myalgia  Morphine Other (See Comments)    REACTION: Patient states that her mother coded when given this medication therefore patient refuses to take   Quinolones Other (See Comments)    Hx of aortic aneurysm   Ceclor [Cefaclor] Rash   Cephalosporins Rash   Keflex [Cephalexin] Rash   Penicillins Hives and Rash    Consultations: Cardiology   Procedures/Studies: ECHOCARDIOGRAM LIMITED  Result Date: 02/20/2023    ECHOCARDIOGRAM LIMITED REPORT   Patient Name:   Heidi Wallace Date of Exam: 02/20/2023 Medical Rec #:  [id number]        Height:       63.0 in Accession #:    [id number]       Weight:       108.0 lb Date of Birth:  May 14, 1936        BSA:          1.488 m Patient Age:    87 years         BP:           148/85 mmHg Patient Gender: F                HR:           75 bpm. Exam Location:  Inpatient Procedure: Limited Echo, Limited Color Doppler and Cardiac Doppler Indications:    Unstable angina [facility]) [[id number]]  History:        Patient has prior history of Echocardiogram examinations, most                 recent 10/07/2022. CAD, PAD; Risk Factors:Hypertension,                 Dyslipidemia and Former Smoker.  Sonographer:    Aron Baba Referring Phys: Laurann Montana  Sonographer Comments: Image acquisition challenging due to respiratory motion. IMPRESSIONS  1. Limited echo  2. Left ventricular ejection fraction, by estimation, is 60 to 65%. The left  ventricle has normal function. The left ventricle has no regional wall motion abnormalities. There is mild left ventricular hypertrophy.  3. Right ventricular systolic function is normal. The right ventricular size is normal. There is normal pulmonary artery systolic pressure. The estimated right ventricular systolic pressure is 19.6 mmHg.  4. The mitral valve is grossly normal. Trivial mitral valve regurgitation.  5. Aortic valve regurgitation is mild.  6. The inferior vena cava is normal in size with greater than 50% respiratory variability, suggesting right atrial pressure of 3 mmHg.  7. Aortic dilatation noted. There is mild dilatation of the ascending aorta, measuring 41 mm. Comparison(s): Changes from prior study are noted. 10/07/2022: LVEF 55-60%, dilated ascending aorta to 41 mm. FINDINGS  Left Ventricle: Left ventricular ejection fraction, by estimation, is 60 to 65%. The left ventricle has normal function. The left ventricle has no regional wall motion abnormalities. The left ventricular internal cavity size was normal in size. There is  mild left ventricular hypertrophy. Right Ventricle: The right ventricular size is normal. No increase in right ventricular wall thickness. Right ventricular systolic function is normal. There is normal pulmonary artery systolic pressure. The tricuspid regurgitant velocity is 2.04 m/s, and  with an assumed right atrial pressure of 3 mmHg, the estimated right ventricular systolic pressure is 19.6 mmHg. Pericardium: There is no evidence of pericardial effusion. Mitral Valve: The mitral valve is grossly normal. Trivial mitral valve regurgitation. Tricuspid Valve: The tricuspid valve is grossly normal. Tricuspid valve regurgitation is mild. Aortic Valve: Aortic valve regurgitation is mild.  Aortic regurgitation PHT measures 731 msec. Aorta: Aortic dilatation noted. There is mild dilatation of the ascending aorta, measuring 41 mm. Venous: The inferior vena cava is normal in size  with greater than 50% respiratory variability, suggesting right atrial pressure of 3 mmHg. Additional Comments: Spectral Doppler performed. Color Doppler performed.  LEFT VENTRICLE PLAX 2D LVIDd:         4.10 cm LVIDs:         3.00 cm LV PW:         1.20 cm LV IVS:        0.80 cm LVOT diam:     1.90 cm LVOT Area:     2.84 cm  LEFT ATRIUM         Index LA diam:    3.60 cm 2.42 cm/m  AORTIC VALVE AI PHT:      731 msec  AORTA Ao Root diam: 3.35 cm MITRAL VALVE               TRICUSPID VALVE MV Area (PHT): 2.76 cm    TR Peak grad:   16.6 mmHg MV Decel Time: 275 msec    TR Vmax:        204.00 cm/s MV E velocity: 54.80 cm/s MV A velocity: 76.70 cm/s  SHUNTS MV E/A ratio:  0.71        Systemic Diam: 1.90 cm Zoila Shutter MD Electronically signed by Zoila Shutter MD Signature Date/Time: 02/20/2023/1:16:54 PM    Final    CARDIAC CATHETERIZATION  Result Date: 02/19/2023   Prox LAD lesion is 90% stenosed.   2nd Mrg lesion is 85% stenosed.   A drug-eluting stent was successfully placed using a STENT ONYX FRONTIER 2.5X12.   Post intervention, there is a 0% residual stenosis.   The left ventricular systolic function is normal.   LV end diastolic pressure is normal.   The left ventricular ejection fraction is greater than 65% by visual estimate. 2 vessel obstructive CAD with 90% proximal LAD and 85% small caliber OM2 Normal LV function Normal LVEDP Successful PCI of the proximal LAD with DES Plan: DAPT for one year. Anticipate DC in am. Given small vessel size and tortuosity I would treat the OM disease medically.   DG Chest Port 1 View  Result Date: 02/19/2023 CLINICAL DATA:  Central substernal chest pain for 5 days. EXAM: PORTABLE CHEST 1 VIEW COMPARISON:  Chest radiograph 02/20/2017 FINDINGS: Stable cardiomediastinal silhouette. Aortic atherosclerotic calcification. Bibasilar atelectasis. Otherwise no focal consolidation, pleural effusion, or pneumothorax. Chronic bronchitic changes. No displaced rib fractures.  IMPRESSION: No acute cardiopulmonary disease. Electronically Signed   By: Minerva Fester M.D.   On: 02/19/2023 02:18     Discharge Exam: Vitals:   02/20/23 0430 02/20/23 0829  BP: (P) 117/64 (!) 148/85  Pulse:  74  Resp: (P) 16 17  Temp:  97.7 F (36.5 C)  SpO2:  100%   Vitals:   02/19/23 2109 02/20/23 0029 02/20/23 0430 02/20/23 0829  BP: (!) 161/84 127/77 (P) 117/64 (!) 148/85  Pulse: 81 71  74  Resp: 16 16 (P) 16 17  Temp: (!) 97.4 F (36.3 C)   97.7 F (36.5 C)  TempSrc: Oral  (P) Oral Oral  SpO2: 100% 99%  100%  Weight:      Height:        General: Pt is alert, awake, not in acute distress Cardiovascular: RRR, S1/S2 +, no rubs, no gallops Respiratory: CTA bilaterally, no wheezing, no rhonchi Abdominal: Soft, NT,  ND, bowel sounds + Extremities: no edema, no cyanosis    The results of significant diagnostics from this hospitalization (including imaging, microbiology, ancillary and laboratory) are listed below for reference.     Microbiology: No results found for this or any previous visit (from the past 240 hour(s)).   Labs: BNP (last 3 results) No results for input(s): "BNP" in the last 8760 hours. Basic Metabolic Panel: Recent Labs  Lab 02/19/23 0203 02/19/23 0356 02/19/23 1917 02/20/23 0350  NA 135  --   --  135  K 3.7  --   --  3.2*  CL 103  --   --  104  CO2 26  --   --  20*  GLUCOSE 105*  --   --  106*  BUN 18  --   --  9  CREATININE 0.92  --  0.79 0.83  CALCIUM 9.4  --   --  8.9  MG  --  2.1  --   --   PHOS  --  3.4  --   --    Liver Function Tests: Recent Labs  Lab 02/19/23 0203 02/20/23 0350  AST 18 73*  ALT 15 65*  ALKPHOS 116 108  BILITOT 0.5 0.8  PROT 7.0 5.3*  ALBUMIN 4.3 3.3*   No results for input(s): "LIPASE", "AMYLASE" in the last 168 hours. No results for input(s): "AMMONIA" in the last 168 hours. CBC: Recent Labs  Lab 02/19/23 0203 02/20/23 0350  WBC 5.8 5.5  NEUTROABS 3.0  --   HGB 13.5 11.9*  HCT 41.4 35.8*   MCV 97.2 93.2  PLT 244 184   Cardiac Enzymes: No results for input(s): "CKTOTAL", "CKMB", "CKMBINDEX", "TROPONINI" in the last 168 hours. BNP: Invalid input(s): "POCBNP" CBG: No results for input(s): "GLUCAP" in the last 168 hours. D-Dimer No results for input(s): "DDIMER" in the last 72 hours. Hgb A1c No results for input(s): "HGBA1C" in the last 72 hours. Lipid Profile Recent Labs    02/20/23 0350  CHOL 170  HDL 70  LDLCALC 88  TRIG 62  CHOLHDL 2.4   Thyroid function studies No results for input(s): "TSH", "T4TOTAL", "T3FREE", "THYROIDAB" in the last 72 hours.  Invalid input(s): "FREET3" Anemia work up No results for input(s): "VITAMINB12", "FOLATE", "FERRITIN", "TIBC", "IRON", "RETICCTPCT" in the last 72 hours. Urinalysis    Component Value Date/Time   COLORURINE YELLOW 02/20/2017 1914   APPEARANCEUR CLEAR 02/20/2017 1914   LABSPEC 1.016 02/20/2017 1914   PHURINE 8.0 02/20/2017 1914   GLUCOSEU NEGATIVE 02/20/2017 1914   HGBUR NEGATIVE 02/20/2017 1914   BILIRUBINUR NEGATIVE 02/20/2017 1914   KETONESUR 5 (A) 02/20/2017 1914   PROTEINUR NEGATIVE 02/20/2017 1914   NITRITE NEGATIVE 02/20/2017 1914   LEUKOCYTESUR TRACE (A) 02/20/2017 1914   Sepsis Labs Recent Labs  Lab 02/19/23 0203 02/20/23 0350  WBC 5.8 5.5   Microbiology No results found for this or any previous visit (from the past 240 hour(s)).  FURTHER DISCHARGE INSTRUCTIONS:   Get Medicines reviewed and adjusted: Please take all your medications with you for your next visit with your Primary MD   Laboratory/radiological data: Please request your Primary MD to go over all hospital tests and procedure/radiological results at the follow up, please ask your Primary MD to get all Hospital records sent to his/her office.   In some cases, they will be blood work, cultures and biopsy results pending at the time of your discharge. Please request that your primary care M.D. goes through all  the records of  your hospital data and follows up on these results.   Also Note the following: If you experience worsening of your admission symptoms, develop shortness of breath, life threatening emergency, suicidal or homicidal thoughts you must seek medical attention immediately by calling 911 or calling your MD immediately  if symptoms less severe.   You must read complete instructions/literature along with all the possible adverse reactions/side effects for all the Medicines you take and that have been prescribed to you. Take any new Medicines after you have completely understood and accpet all the possible adverse reactions/side effects.    Do not drive when taking Pain medications or sleeping medications (Benzodaizepines)   Do not take more than prescribed Pain, Sleep and Anxiety Medications. It is not advisable to combine anxiety,sleep and pain medications without talking with your primary care practitioner   Special Instructions: If you have smoked or chewed Tobacco  in the last 2 yrs please stop smoking, stop any regular Alcohol  and or any Recreational drug use.   Wear Seat belts while driving.   Please note: You were cared for by a hospitalist during your hospital stay. Once you are discharged, your primary care physician will handle any further medical issues. Please note that NO REFILLS for any discharge medications will be authorized once you are discharged, as it is imperative that you return to your primary care physician (or establish a relationship with a primary care physician if you do not have one) for your post hospital discharge needs so that they can reassess your need for medications and monitor your lab values  Time coordinating discharge: Over 30 minutes  SIGNED:   Hughie Closs, MD  [facility] 02/20/2023, 3:08 PM *Please note that this is a verbal dictation therefore any spelling or grammatical errors are due to the "[employer] One" system interpretation. If 7PM-7AM,  please contact night-coverage [email or web address]

## 2023-02-20 NOTE — Progress Notes (Signed)
Dr. Antionette Char paged at [phone]. Pt requesting home dose of gabapentin. Orders placed.

## 2023-02-20 NOTE — Progress Notes (Signed)
  Echocardiogram 2D Echocardiogram has been performed.  Heidi Wallace 02/20/2023, 10:58 AM

## 2023-02-22 ENCOUNTER — Encounter (HOSPITAL_COMMUNITY): Payer: Self-pay | Admitting: Cardiology

## 2023-02-22 LAB — LIPOPROTEIN A (LPA): Lipoprotein (a): 147.3 nmol/L — ABNORMAL HIGH (ref <75.0)

## 2023-02-23 DIAGNOSIS — D649 Anemia, unspecified: Secondary | ICD-10-CM | POA: Diagnosis not present

## 2023-02-23 DIAGNOSIS — Z0001 Encounter for general adult medical examination with abnormal findings: Secondary | ICD-10-CM | POA: Diagnosis not present

## 2023-02-23 DIAGNOSIS — Z681 Body mass index (BMI) 19 or less, adult: Secondary | ICD-10-CM | POA: Diagnosis not present

## 2023-02-23 DIAGNOSIS — E7849 Other hyperlipidemia: Secondary | ICD-10-CM | POA: Diagnosis not present

## 2023-02-23 DIAGNOSIS — N183 Chronic kidney disease, stage 3 unspecified: Secondary | ICD-10-CM | POA: Diagnosis not present

## 2023-02-23 DIAGNOSIS — I10 Essential (primary) hypertension: Secondary | ICD-10-CM | POA: Diagnosis not present

## 2023-03-02 ENCOUNTER — Ambulatory Visit: Payer: Medicare PPO | Attending: Nurse Practitioner | Admitting: Nurse Practitioner

## 2023-03-02 ENCOUNTER — Encounter: Payer: Self-pay | Admitting: Nurse Practitioner

## 2023-03-02 VITALS — BP 128/70 | HR 100 | Ht 63.0 in | Wt 115.6 lb

## 2023-03-02 DIAGNOSIS — I259 Chronic ischemic heart disease, unspecified: Secondary | ICD-10-CM | POA: Diagnosis not present

## 2023-03-02 DIAGNOSIS — I77819 Aortic ectasia, unspecified site: Secondary | ICD-10-CM

## 2023-03-02 DIAGNOSIS — Z79899 Other long term (current) drug therapy: Secondary | ICD-10-CM | POA: Diagnosis not present

## 2023-03-02 DIAGNOSIS — Z7902 Long term (current) use of antithrombotics/antiplatelets: Secondary | ICD-10-CM | POA: Diagnosis not present

## 2023-03-02 DIAGNOSIS — E7849 Other hyperlipidemia: Secondary | ICD-10-CM | POA: Diagnosis not present

## 2023-03-02 DIAGNOSIS — E785 Hyperlipidemia, unspecified: Secondary | ICD-10-CM

## 2023-03-02 DIAGNOSIS — I739 Peripheral vascular disease, unspecified: Secondary | ICD-10-CM | POA: Diagnosis not present

## 2023-03-02 DIAGNOSIS — I2510 Atherosclerotic heart disease of native coronary artery without angina pectoris: Secondary | ICD-10-CM

## 2023-03-02 DIAGNOSIS — Z5181 Encounter for therapeutic drug level monitoring: Secondary | ICD-10-CM

## 2023-03-02 DIAGNOSIS — I10 Essential (primary) hypertension: Secondary | ICD-10-CM

## 2023-03-02 DIAGNOSIS — R0602 Shortness of breath: Secondary | ICD-10-CM | POA: Diagnosis not present

## 2023-03-02 MED ORDER — CLOPIDOGREL BISULFATE 75 MG PO TABS: 75.0000 mg | ORAL_TABLET | Freq: Every day | ORAL | 3 refills | Status: AC

## 2023-03-02 MED ORDER — CLOPIDOGREL BISULFATE 75 MG PO TABS: 600.0000 mg | ORAL_TABLET | Freq: Once | ORAL | 0 refills | Status: AC

## 2023-03-02 NOTE — Progress Notes (Addendum)
Cardiology Office Note:  .   Date:  03/02/2023 ID:  Heidi Wallace, DOB 06/28/1936, MRN [id number] PCP: Estanislado Pandy, MD  [facility] Providers Cardiologist:  Donato Schultz, MD    History of Present Illness: Marland Kitchen   Heidi Wallace is a 87 y.o. female with a PMH of CAD, HLD, HTN, PVC's/PAC's, GERD, aortic dilitation, and PAD (hx of right popliteal stent), who presents today for hospital follow-up.  TTE 09/2022 revealed EF 55 to 60%, grade 1 DD, mild AR, mild dilatation of ascending aorta measuring 41 mm.   Transferred from [facility] to [facility] and underwent cardiac cath due to having chest pain and mild trop elevation 01/2023. Cath on 02/19/23 revealed 90% proximal LAD stenosis, received DES.  OM 2 lesion was at 85% stenosis but due to being small vessel, was recommended to be medically managed. Limited Echo showed EF 60-65%, no RWMA, mild LVH, mild dilatation of ascending aorta at 41 mm, stable from 09/2022.   Today she presents for hospital follow-up. She states she has noticed shortness of breath since her procedure. Says this started after she started receiving Brilinta. Has also noticed leg weakness ever since her procedure as well. Denies any chest pain, palpitations, syncope, presyncope, dizziness, orthopnea, PND, swelling or significant weight changes, acute bleeding, or claudication.   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   Studies Reviewed: .    Echo limited 02/20/2023:   1. Limited echo   2. Left ventricular ejection fraction, by estimation, is 60 to 65%. The  left ventricle has normal function. The left ventricle has no regional  wall motion abnormalities. There is mild left ventricular hypertrophy.   3. Right ventricular systolic function is normal. The right ventricular  size is normal. There is normal pulmonary artery systolic pressure. The  estimated right ventricular systolic pressure is 19.6 mmHg.   4. The mitral valve is grossly  normal. Trivial mitral valve  regurgitation.   5. Aortic valve regurgitation is mild.   6. The inferior vena cava is normal in size with greater than 50%  respiratory variability, suggesting right atrial pressure of 3 mmHg.   7. Aortic dilatation noted. There is mild dilatation of the ascending  aorta, measuring 41 mm.   Comparison(s): Changes from prior study are noted. 10/07/2022: LVEF 55-60%,  dilated ascending aorta to 41 mm.  LHC 02/19/2023:    Prox LAD lesion is 90% stenosed.   2nd Mrg lesion is 85% stenosed.   A drug-eluting stent was successfully placed using a STENT ONYX FRONTIER 2.5X12.   Post intervention, there is a 0% residual stenosis.   The left ventricular systolic function is normal.   LV end diastolic pressure is normal.   The left ventricular ejection fraction is greater than 65% by visual estimate.   2 vessel obstructive CAD with 90% proximal LAD and 85% small caliber OM2 Normal LV function Normal LVEDP Successful PCI of the proximal LAD with DES    Plan: DAPT for one year. Anticipate DC in am. Given small vessel size and tortuosity I would treat the OM disease medically.    Echo complete 10/07/2022:   1. Left ventricular ejection fraction, by estimation, is 55 to 60%. The  left ventricle has normal function. The left ventricle has no regional  wall motion abnormalities. There is mild left ventricular hypertrophy.  Left ventricular diastolic parameters  are consistent with Grade I diastolic dysfunction (impaired relaxation).  2. Right ventricular systolic function is normal. The right ventricular  size is normal. There is normal pulmonary artery systolic pressure.   3. The mitral valve is normal in structure. Trivial mitral valve  regurgitation. No evidence of mitral stenosis.   4. The aortic valve is tricuspid. Aortic valve regurgitation is mild.  Aortic valve sclerosis is present, with no evidence of aortic valve  stenosis.   5. Aortic dilatation noted.  There is mild dilatation of the ascending  aorta, measuring 41 mm.   6. The inferior vena cava is normal in size with greater than 50%  respiratory variability, suggesting right atrial pressure of 3 mmHg.  NST 09/23/2022:    The study is normal. The study is low risk.   No ST deviation was noted.   LV perfusion is normal. There is no evidence of ischemia. There is no evidence of infarction.   Left ventricular function is normal. Nuclear stress EF: 56 %. The left ventricular ejection fraction is normal (55-65%). End diastolic cavity size is normal. End systolic cavity size is normal.  ABI 08/2022:  Summary:  Right: Resting right ankle-brachial index is within normal range. The  right toe-brachial index is abnormal.   Left: Resting left ankle-brachial index is within normal range. The left  toe-brachial index is normal.  Arterial duplex 08/2022:  Right: patent stent with no stenosis.   Physical Exam:   VS:  BP 128/70   Pulse 100   Ht 5\' 3"  (1.6 m)   Wt 115 lb 9.6 oz (52.4 kg)   BMI 20.48 kg/m    Wt Readings from Last 3 Encounters:  03/02/23 115 lb 9.6 oz (52.4 kg)  02/19/23 108 lb (49 kg)  10/12/22 113 lb (51.3 kg)    GEN: Well nourished, well developed in no acute distress NECK: No JVD; No carotid bruits CARDIAC: S1/S2, RRR, no murmurs, rubs, gallops; right radial cath site is healing well.  RESPIRATORY:  Clear to auscultation without rales, wheezing or rhonchi  ABDOMEN: Soft, non-tender, non-distended EXTREMITIES:  No edema; No deformity   ASSESSMENT AND PLAN: .    CAD, shortness of breath, medication management, encounter for monitoring for antiplatelet therapy Recent LHC showed 2 vessel obstructive CAD with successful PCI/DES to pLAD. OM 2 lesion was at 85% stenosis but due to being small vessel, was recommended to be medically managed. Denies any recent chest pain. Right radial cath site is healing well. Does admit to some shortness of breath ever since starting Brilinta,  which I discussed with patient can be a side effect of Brilinta. Encouraged patient to drink some caffeine to alleviate the shortness of breath as this has been shown to help improve symptoms. Offered to patient that if she is unable to tolerate caffeine, we can stop Brilinta and switch to another well tolerable antiplatelet. Has tolerated Plavix well in past. Pt requesting to finish current Brilinta prescription and switch to Plavix. Consulted clinical pharm D ([name] Supple) regarding transition. 12 hours after she takes her last dose of Brilinta, she will take 600 mg of Plavix for the first day followed by Plavix 75 mg daily thereafter. Pt verbalized understanding. Pt has been on Plavix in the past and tolerated well. Will obtain CBC, BMET, and FLP in 6 weeks. Continue Aspirin, Zetia, Toprol XL, Rosuvastatin, Olmesartan, and NTG. Heart healthy diet and regular cardiovascular exercise encouraged.   HLD LDL 02/20/2023 was 88. LDL goal with PCI < 70. Will obtain FLP in 6 weeks. Continue Zetia and Rosuvastatin.  Heart healthy diet and regular cardiovascular exercise encouraged.   HTN BP well controlled. Continue current medication regimen. Discussed to monitor BP at home at least 2 hours after medications and sitting for 5-10 minutes. Heart healthy diet and regular cardiovascular exercise encouraged.   PAD  Hx of right popliteal stent. ABI's 08/2022 normal with arterial duplex at time showing patent right popliteal stent. Does admit to some leg weakness since procedure. Encouraged increasing ambulation post procedure as much as tolerated. Continue to follow with PCP and VVS.   Aortic dilitation Limited Echo 01/2023 showed stable mild dilatation of ascending aorta at 41 mm from previous study. Recommend updating Echo in 1 year for monitoring.     Dispo: Follow-up with me/APP in 2-3 months or sooner if anything changes.   Signed, Sharlene Dory, NP

## 2023-03-02 NOTE — Patient Instructions (Addendum)
Medication Instructions:  Your physician has recommended you make the following change in your medication:  Doreatha Martin out taking Brilinta  12 Hours after your last dose of Brilinta, take 600 mg dose of Clopidogrel Then start Clopidogrel 75 Mg the next day after first initial dose of Clopidogrel Continue all other medications as prescribed   Labwork: CBC, BMET, FLP in 6 weeks at [facility]   Testing/Procedures: None  Follow-Up: Your physician recommends that you schedule a follow-up appointment in: 2-3 Months with Philis Nettle   Any Other Special Instructions Will Be Listed Below (If Applicable).  If you need a refill on your cardiac medications before your next appointment, please call your pharmacy.

## 2023-03-05 ENCOUNTER — Telehealth: Payer: Self-pay | Admitting: Nurse Practitioner

## 2023-03-05 NOTE — Telephone Encounter (Signed)
New Message:     Patient wants to know if you received a copy of her lab results this week from her primary doctor?

## 2023-03-05 NOTE — Telephone Encounter (Signed)
Advised patient we have received her results they are still in our onbase currently

## 2023-03-22 DIAGNOSIS — Z6820 Body mass index (BMI) 20.0-20.9, adult: Secondary | ICD-10-CM | POA: Diagnosis not present

## 2023-03-22 DIAGNOSIS — I10 Essential (primary) hypertension: Secondary | ICD-10-CM | POA: Diagnosis not present

## 2023-03-22 DIAGNOSIS — I259 Chronic ischemic heart disease, unspecified: Secondary | ICD-10-CM | POA: Diagnosis not present

## 2023-03-22 DIAGNOSIS — I2510 Atherosclerotic heart disease of native coronary artery without angina pectoris: Secondary | ICD-10-CM | POA: Diagnosis not present

## 2023-03-22 DIAGNOSIS — E782 Mixed hyperlipidemia: Secondary | ICD-10-CM | POA: Diagnosis not present

## 2023-03-31 DIAGNOSIS — H401132 Primary open-angle glaucoma, bilateral, moderate stage: Secondary | ICD-10-CM | POA: Diagnosis not present

## 2023-04-19 ENCOUNTER — Encounter: Payer: Self-pay | Admitting: Cardiology

## 2023-04-21 DIAGNOSIS — M1990 Unspecified osteoarthritis, unspecified site: Secondary | ICD-10-CM | POA: Diagnosis not present

## 2023-04-21 DIAGNOSIS — Z6820 Body mass index (BMI) 20.0-20.9, adult: Secondary | ICD-10-CM | POA: Diagnosis not present

## 2023-04-21 DIAGNOSIS — R030 Elevated blood-pressure reading, without diagnosis of hypertension: Secondary | ICD-10-CM | POA: Diagnosis not present

## 2023-04-21 DIAGNOSIS — Z23 Encounter for immunization: Secondary | ICD-10-CM | POA: Diagnosis not present

## 2023-04-26 DIAGNOSIS — L821 Other seborrheic keratosis: Secondary | ICD-10-CM | POA: Diagnosis not present

## 2023-04-26 DIAGNOSIS — Z85828 Personal history of other malignant neoplasm of skin: Secondary | ICD-10-CM | POA: Diagnosis not present

## 2023-04-27 NOTE — Progress Notes (Deleted)
Cardiology Office Note:  .   Date:  04/27/2023  ID:  Ronnie Derby, DOB Sep 05, 1935, MRN [id number] PCP: Estanislado Pandy, MD  [facility] Providers Cardiologist:  Donato Schultz, MD { Click to update primary MD,subspecialty MD or APP then REFRESH:1}   History of Present Illness: Marland Kitchen   NAKAILA COLEY is a 87 y.o. 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    Transferred [facility] to [facility] and underwent cardiac cath due to having chest pain and mild trop elevation 01/2023. Cath on 02/19/23 revealed 90% proximal LAD stenosis, received DES.  OM 2 lesion was at 85% stenosis but due to being small vessel, was recommended to be medically managed. Limited Echo showed EF 60-65%, no RWMA, mild LVH, mild dilatation of ascending aorta at 41 mm, stable from 09/2022.   Patient saw Dr. Anne Fu post hosptial and SOB with Brininta and transitioned to Plavix.  ROS: ***  Studies Reviewed: Marland Kitchen         Prior CV Studies: {Select studies to display:26339}   Echo limited 02/20/2023:   1. Limited echo   2. Left ventricular ejection fraction, by estimation, is 60 to 65%. The  left ventricle has normal function. The left ventricle has no regional  wall motion abnormalities. There is mild left ventricular hypertrophy.   3. Right ventricular systolic function is normal. The right ventricular  size is normal. There is normal pulmonary artery systolic pressure. The  estimated right ventricular systolic pressure is 19.6 mmHg.   4. The mitral valve is grossly normal. Trivial mitral valve  regurgitation.   5. Aortic valve regurgitation is mild.   6. The inferior vena cava is normal in size with greater than 50%  respiratory variability, suggesting right atrial pressure of 3 mmHg.   7. Aortic  dilatation noted. There is mild dilatation of the ascending  aorta, measuring 41 mm.   Comparison(s): Changes from prior study are noted. 10/07/2022: LVEF 55-60%,  dilated ascending aorta to 41 mm.   LHC 02/19/2023:    Prox LAD lesion is 90% stenosed.   2nd Mrg lesion is 85% stenosed.   A drug-eluting stent was successfully placed using a STENT ONYX FRONTIER 2.5X12.   Post intervention, there is a 0% residual stenosis.   The left ventricular systolic function is normal.   LV end diastolic pressure is normal.   The left ventricular ejection fraction is greater than 65% by visual estimate.   2 vessel obstructive CAD with 90% proximal LAD and 85% small caliber OM2 Normal LV function Normal LVEDP Successful PCI of the proximal LAD with DES    Plan: DAPT for one year. Anticipate DC in am. Given small vessel size and tortuosity I would treat the OM disease medically.    Echo complete 10/07/2022:   1. Left ventricular ejection fraction, by estimation, is 55 to 60%. The  left ventricle has normal function. The left ventricle has no regional  wall motion abnormalities. There is mild left ventricular hypertrophy.  Left ventricular diastolic parameters  are consistent with Grade I diastolic dysfunction (impaired relaxation).   2. Right ventricular systolic function is normal. The right ventricular  size is normal. There is normal pulmonary artery systolic pressure.   3. The mitral valve is  normal in structure. Trivial mitral valve  regurgitation. No evidence of mitral stenosis.   4. The aortic valve is tricuspid. Aortic valve regurgitation is mild.  Aortic valve sclerosis is present, with no evidence of aortic valve  stenosis.   5. Aortic dilatation noted. There is mild dilatation of the ascending  aorta, measuring 41 mm.   6. The inferior vena cava is normal in size with greater than 50%  respiratory variability, suggesting right atrial pressure of 3 mmHg.   NST 09/23/2022:    The study is  normal. The study is low risk.   No ST deviation was noted.   LV perfusion is normal. There is no evidence of ischemia. There is no evidence of infarction.   Left ventricular function is normal. Nuclear stress EF: 56 %. The left ventricular ejection fraction is normal (55-65%). End diastolic cavity size is normal. End systolic cavity size is normal.   ABI 08/2022:  Summary:  Right: Resting right ankle-brachial index is within normal range. The  right toe-brachial index is abnormal.   Left: Resting left ankle-brachial index is within normal range. The left  toe-brachial index is normal.   Arterial duplex 08/2022:  Right: patent stent with no stenosis.   Risk Assessment/Calculations:   {Does this patient have ATRIAL FIBRILLATION?:[id number]} No BP recorded.  {Refresh Note OR Click here to enter BP  :1}***       Physical Exam:   VS:  There were no vitals taken for this visit.   Wt Readings from Last 3 Encounters:  03/02/23 115 lb 9.6 oz (52.4 kg)  02/19/23 108 lb (49 kg)  10/12/22 113 lb (51.3 kg)    GEN: Well nourished, well developed in no acute distress NECK: No JVD; No carotid bruits CARDIAC: ***RRR, no murmurs, rubs, gallops RESPIRATORY:  Clear to auscultation without rales, wheezing or rhonchi  ABDOMEN: Soft, non-tender, non-distended EXTREMITIES:  No edema; No deformity   ASSESSMENT AND PLAN: .    CAD, shortness of breath, medication management, encounter for monitoring for antiplatelet therapy Recent LHC showed 2 vessel obstructive CAD with successful PCI/DES to pLAD. OM 2 lesion was at 85% stenosis but due to being small vessel, was recommended to be medically managed. Denies any recent chest pain. Briilinta changed to Plavix due to dyspnea  HLD  HTN  PADHx of right popliteal stent. ABI's 08/2022 normal with arterial duplex at time showing patent right popliteal stent. Does admit to some leg weakness since procedure. Encouraged increasing ambulation post procedure as  much as tolerated. Continue to follow with PCP and VVS.   Aortic dilatation.Limited Echo 01/2023 showed stable mild dilatation of ascending aorta at 41 mm from previous study. Recommend updating Echo in 01/2024 for monitoring.     {Are you ordering a CV Procedure (e.g. stress test, cath, DCCV, TEE, etc)?   Press F2        :[id number]}  Dispo: ***  Signed, Jacolyn Reedy, PA-C

## 2023-05-03 ENCOUNTER — Encounter: Payer: Self-pay | Admitting: Nurse Practitioner

## 2023-05-03 ENCOUNTER — Ambulatory Visit: Payer: Medicare PPO | Attending: Nurse Practitioner | Admitting: Nurse Practitioner

## 2023-05-03 VITALS — BP 128/70 | HR 83 | Ht 63.0 in | Wt 117.6 lb

## 2023-05-03 DIAGNOSIS — I10 Essential (primary) hypertension: Secondary | ICD-10-CM | POA: Diagnosis not present

## 2023-05-03 DIAGNOSIS — I739 Peripheral vascular disease, unspecified: Secondary | ICD-10-CM | POA: Diagnosis not present

## 2023-05-03 DIAGNOSIS — I2510 Atherosclerotic heart disease of native coronary artery without angina pectoris: Secondary | ICD-10-CM | POA: Diagnosis not present

## 2023-05-03 DIAGNOSIS — E785 Hyperlipidemia, unspecified: Secondary | ICD-10-CM

## 2023-05-03 DIAGNOSIS — I77819 Aortic ectasia, unspecified site: Secondary | ICD-10-CM

## 2023-05-03 DIAGNOSIS — Z79899 Other long term (current) drug therapy: Secondary | ICD-10-CM | POA: Diagnosis not present

## 2023-05-03 NOTE — Patient Instructions (Addendum)
Medication Instructions:  Your physician recommends that you continue on your current medications as directed. Please refer to the Current Medication list given to you today.  Labwork: In 1-2 weeks   Testing/Procedures: None   Follow-Up: Your physician recommends that you schedule a follow-up appointment in: F/u as scheduled   Any Other Special Instructions Will Be Listed Below (If Applicable).  If you need a refill on your cardiac medications before your next appointment, please call your pharmacy.

## 2023-05-03 NOTE — Progress Notes (Signed)
Cardiology Office Note:  .   Date:  05/03/2023 ID:  Heidi Wallace, DOB [birth year], MRN [id number] PCP: Estanislado Pandy, MD  [facility] Providers Cardiologist:  Donato Schultz, MD    History of Present Illness: Marland Kitchen   Heidi Wallace is a 87 y.o. female with a PMH of CAD, s/p NSTEMI, HLD, HTN, PVC's/PAC's, GERD, aortic dilitation, and PAD (hx of right popliteal stent), who presents today for follow-up.  TTE 09/2022 revealed EF 55 to 60%, grade 1 DD, mild AR, mild dilatation of ascending aorta measuring 41 mm.  Transferred from [facility] to [facility] and underwent cardiac cath due to having chest pain and mild trop elevation 01/2023. Cath on 02/19/23 revealed 90% proximal LAD stenosis, received DES.  OM 2 lesion was at 85% stenosis but due to being small vessel, was recommended to be medically managed. Limited Echo showed EF 60-65%, no RWMA, mild LVH, mild dilatation of ascending aorta at 41 mm, stable from 09/2022.   I last saw her for hospital follow-up on 03/02/2023. She had noticed shortness of breath since her procedure after she started receiving Brilinta. Had noticed leg weakness ever since her procedure as well. Denied any chest pain, palpitations, syncope, presyncope, dizziness, orthopnea, PND, swelling or significant weight changes, acute bleeding, or claudication. Was switched from Brilinta to Plavix.   Today she presents for follow-up. Breathing has improved and denies any recent shortness of breath. Now on Plavix and does admit to some minimal bruising. Denies any chest pain,  palpitations, syncope, presyncope, dizziness, orthopnea, PND, swelling or significant weight changes, acute bleeding, or claudication. She is thinking about doing some physical therapy to improve her balance and get this arranged with her PCP.   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   Studies Reviewed: .    Echo limited 02/20/2023:   1. Limited echo   2. Left ventricular  ejection fraction, by estimation, is 60 to 65%. The  left ventricle has normal function. The left ventricle has no regional  wall motion abnormalities. There is mild left ventricular hypertrophy.   3. Right ventricular systolic function is normal. The right ventricular  size is normal. There is normal pulmonary artery systolic pressure. The  estimated right ventricular systolic pressure is 19.6 mmHg.   4. The mitral valve is grossly normal. Trivial mitral valve  regurgitation.   5. Aortic valve regurgitation is mild.   6. The inferior vena cava is normal in size with greater than 50%  respiratory variability, suggesting right atrial pressure of 3 mmHg.   7. Aortic dilatation noted. There is mild dilatation of the ascending  aorta, measuring 41 mm.   Comparison(s): Changes from prior study are noted. 10/07/2022: LVEF 55-60%,  dilated ascending aorta to 41 mm.  LHC 02/19/2023:    Prox LAD lesion is 90% stenosed.   2nd Mrg lesion is 85% stenosed.   A drug-eluting stent was successfully placed using a STENT ONYX FRONTIER 2.5X12.   Post intervention, there is a 0% residual stenosis.   The left ventricular systolic function is normal.   LV end diastolic pressure is normal.   The left ventricular ejection fraction is greater than 65% by visual estimate.   2 vessel obstructive CAD with 90% proximal LAD and 85% small caliber OM2 Normal LV function Normal LVEDP Successful PCI of the proximal LAD with DES    Plan: DAPT for one year. Anticipate DC in am. Given small  vessel size and tortuosity I would treat the OM disease medically.    Echo complete 10/07/2022:   1. Left ventricular ejection fraction, by estimation, is 55 to 60%. The  left ventricle has normal function. The left ventricle has no regional  wall motion abnormalities. There is mild left ventricular hypertrophy.  Left ventricular diastolic parameters  are consistent with Grade I diastolic dysfunction (impaired relaxation).   2.  Right ventricular systolic function is normal. The right ventricular  size is normal. There is normal pulmonary artery systolic pressure.   3. The mitral valve is normal in structure. Trivial mitral valve  regurgitation. No evidence of mitral stenosis.   4. The aortic valve is tricuspid. Aortic valve regurgitation is mild.  Aortic valve sclerosis is present, with no evidence of aortic valve  stenosis.   5. Aortic dilatation noted. There is mild dilatation of the ascending  aorta, measuring 41 mm.   6. The inferior vena cava is normal in size with greater than 50%  respiratory variability, suggesting right atrial pressure of 3 mmHg.  NST 09/23/2022:    The study is normal. The study is low risk.   No ST deviation was noted.   LV perfusion is normal. There is no evidence of ischemia. There is no evidence of infarction.   Left ventricular function is normal. Nuclear stress EF: 56 %. The left ventricular ejection fraction is normal (55-65%). End diastolic cavity size is normal. End systolic cavity size is normal.  ABI 08/2022:  Summary:  Right: Resting right ankle-brachial index is within normal range. The  right toe-brachial index is abnormal.   Left: Resting left ankle-brachial index is within normal range. The left  toe-brachial index is normal.  Arterial duplex 08/2022:  Right: patent stent with no stenosis.   Physical Exam:   VS:  BP 128/70   Pulse 83   Ht 5\' 3"  (1.6 m)   Wt 117 lb 9.6 oz (53.3 kg)   SpO2 99%   BMI 20.83 kg/m    Wt Readings from Last 3 Encounters:  05/03/23 117 lb 9.6 oz (53.3 kg)  03/02/23 115 lb 9.6 oz (52.4 kg)  02/19/23 108 lb (49 kg)    GEN: Well nourished, well developed in no acute distress NECK: No JVD; No carotid bruits CARDIAC: S1/S2, RRR, no murmurs, rubs, gallops RESPIRATORY:  Clear to auscultation without rales, wheezing or rhonchi  ABDOMEN: Soft, non-tender, non-distended EXTREMITIES:  No edema; No deformity   ASSESSMENT AND PLAN: .     CAD, s/p NSTEMI Recent LHC showed 2 vessel obstructive CAD with successful PCI/DES to pLAD. OM 2 lesion was at 85% stenosis but due to being small vessel, was recommended to be medically managed. Denies any recent chest pain. Continue Aspirin, Plavix, Zetia, Toprol XL, Rosuvastatin, Olmesartan, and NTG. Heart healthy diet and regular cardiovascular exercise encouraged. Pt declines cardiac rehab at this time and will let me know if she changes her mind.   HLD, medication management LDL 02/20/2023 was 88. LDL goal with PCI < 70. Will obtain FLP/LFT in 1-2 weeks. Continue Zetia and Rosuvastatin. Heart healthy diet and regular cardiovascular exercise encouraged.   HTN BP well controlled. Continue current medication regimen. Discussed to monitor BP at home at least 2 hours after medications and sitting for 5-10 minutes. Heart healthy diet and regular cardiovascular exercise encouraged.   PAD  Hx of right popliteal stent. ABI's 08/2022 normal with arterial duplex at time showing patent right popliteal stent. Denies any claudication symptoms. Encouraged  increasing ambulation as much as tolerated. Continue to follow with PCP and VVS.   Aortic dilitation Limited Echo 01/2023 showed stable mild dilatation of ascending aorta at 41 mm from previous study. Recommend updating Echo in 1 year for monitoring.     Dispo: Follow-up with MD as scheduled or with APP sooner if anything changes.   Signed, Sharlene Dory, NP

## 2023-05-11 ENCOUNTER — Ambulatory Visit: Payer: Medicare PPO | Admitting: Physician Assistant

## 2023-05-11 DIAGNOSIS — I77819 Aortic ectasia, unspecified site: Secondary | ICD-10-CM

## 2023-05-11 DIAGNOSIS — I739 Peripheral vascular disease, unspecified: Secondary | ICD-10-CM

## 2023-05-11 DIAGNOSIS — E785 Hyperlipidemia, unspecified: Secondary | ICD-10-CM

## 2023-05-11 DIAGNOSIS — I10 Essential (primary) hypertension: Secondary | ICD-10-CM

## 2023-05-11 DIAGNOSIS — I2510 Atherosclerotic heart disease of native coronary artery without angina pectoris: Secondary | ICD-10-CM

## 2023-05-14 DIAGNOSIS — Z79899 Other long term (current) drug therapy: Secondary | ICD-10-CM | POA: Diagnosis not present

## 2023-05-14 DIAGNOSIS — E7849 Other hyperlipidemia: Secondary | ICD-10-CM | POA: Diagnosis not present

## 2023-05-14 DIAGNOSIS — E782 Mixed hyperlipidemia: Secondary | ICD-10-CM | POA: Diagnosis not present

## 2023-05-18 ENCOUNTER — Encounter: Payer: Self-pay | Admitting: Family Medicine

## 2023-05-28 DIAGNOSIS — I2510 Atherosclerotic heart disease of native coronary artery without angina pectoris: Secondary | ICD-10-CM | POA: Diagnosis not present

## 2023-05-28 DIAGNOSIS — M1990 Unspecified osteoarthritis, unspecified site: Secondary | ICD-10-CM | POA: Diagnosis not present

## 2023-05-28 DIAGNOSIS — E782 Mixed hyperlipidemia: Secondary | ICD-10-CM | POA: Diagnosis not present

## 2023-05-31 ENCOUNTER — Other Ambulatory Visit: Payer: Self-pay | Admitting: Cardiology

## 2023-06-02 ENCOUNTER — Encounter (HOSPITAL_COMMUNITY): Payer: Self-pay | Admitting: Emergency Medicine

## 2023-06-02 ENCOUNTER — Emergency Department (HOSPITAL_COMMUNITY): Payer: Medicare PPO

## 2023-06-02 ENCOUNTER — Other Ambulatory Visit: Payer: Self-pay

## 2023-06-02 ENCOUNTER — Observation Stay (HOSPITAL_COMMUNITY)
Admission: EM | Admit: 2023-06-02 | Discharge: 2023-06-03 | Disposition: A | Discharge status: Home / Self Care | Payer: Medicare PPO | Attending: Internal Medicine | Admitting: Internal Medicine

## 2023-06-02 DIAGNOSIS — R079 Chest pain, unspecified: Principal | ICD-10-CM | POA: Diagnosis present

## 2023-06-02 DIAGNOSIS — Z7982 Long term (current) use of aspirin: Secondary | ICD-10-CM | POA: Insufficient documentation

## 2023-06-02 DIAGNOSIS — I2510 Atherosclerotic heart disease of native coronary artery without angina pectoris: Secondary | ICD-10-CM | POA: Insufficient documentation

## 2023-06-02 DIAGNOSIS — N189 Chronic kidney disease, unspecified: Secondary | ICD-10-CM | POA: Diagnosis not present

## 2023-06-02 DIAGNOSIS — R072 Precordial pain: Principal | ICD-10-CM | POA: Insufficient documentation

## 2023-06-02 DIAGNOSIS — R110 Nausea: Secondary | ICD-10-CM | POA: Diagnosis not present

## 2023-06-02 DIAGNOSIS — Z7902 Long term (current) use of antithrombotics/antiplatelets: Secondary | ICD-10-CM | POA: Diagnosis not present

## 2023-06-02 DIAGNOSIS — Z79899 Other long term (current) drug therapy: Secondary | ICD-10-CM | POA: Insufficient documentation

## 2023-06-02 DIAGNOSIS — R2681 Unsteadiness on feet: Secondary | ICD-10-CM | POA: Insufficient documentation

## 2023-06-02 DIAGNOSIS — I129 Hypertensive chronic kidney disease with stage 1 through stage 4 chronic kidney disease, or unspecified chronic kidney disease: Secondary | ICD-10-CM | POA: Insufficient documentation

## 2023-06-02 DIAGNOSIS — Z87891 Personal history of nicotine dependence: Secondary | ICD-10-CM | POA: Insufficient documentation

## 2023-06-02 DIAGNOSIS — Z955 Presence of coronary angioplasty implant and graft: Secondary | ICD-10-CM | POA: Insufficient documentation

## 2023-06-02 DIAGNOSIS — I700 Atherosclerosis of aorta: Secondary | ICD-10-CM | POA: Diagnosis not present

## 2023-06-02 DIAGNOSIS — R2689 Other abnormalities of gait and mobility: Secondary | ICD-10-CM | POA: Insufficient documentation

## 2023-06-02 DIAGNOSIS — M6281 Muscle weakness (generalized): Secondary | ICD-10-CM | POA: Insufficient documentation

## 2023-06-02 DIAGNOSIS — I7121 Aneurysm of the ascending aorta, without rupture: Secondary | ICD-10-CM | POA: Diagnosis not present

## 2023-06-02 DIAGNOSIS — I10 Essential (primary) hypertension: Secondary | ICD-10-CM | POA: Diagnosis not present

## 2023-06-02 DIAGNOSIS — R918 Other nonspecific abnormal finding of lung field: Secondary | ICD-10-CM | POA: Diagnosis not present

## 2023-06-02 DIAGNOSIS — I499 Cardiac arrhythmia, unspecified: Secondary | ICD-10-CM | POA: Diagnosis not present

## 2023-06-02 DIAGNOSIS — E7849 Other hyperlipidemia: Secondary | ICD-10-CM | POA: Diagnosis not present

## 2023-06-02 DIAGNOSIS — I517 Cardiomegaly: Secondary | ICD-10-CM | POA: Diagnosis not present

## 2023-06-02 DIAGNOSIS — R7301 Impaired fasting glucose: Secondary | ICD-10-CM | POA: Diagnosis not present

## 2023-06-02 DIAGNOSIS — R0789 Other chest pain: Secondary | ICD-10-CM | POA: Diagnosis not present

## 2023-06-02 DIAGNOSIS — Z8679 Personal history of other diseases of the circulatory system: Secondary | ICD-10-CM

## 2023-06-02 LAB — COMPREHENSIVE METABOLIC PANEL
ALT: 13 U/L (ref 0–44)
AST: 20 U/L (ref 15–41)
Albumin: 3.9 g/dL (ref 3.5–5.0)
Alkaline Phosphatase: 98 U/L (ref 38–126)
Anion gap: 9 (ref 5–15)
BUN: 16 mg/dL (ref 8–23)
CO2: 25 mmol/L (ref 22–32)
Calcium: 9.4 mg/dL (ref 8.9–10.3)
Chloride: 103 mmol/L (ref 98–111)
Creatinine, Ser: 0.9 mg/dL (ref 0.44–1.00)
GFR, Estimated: >60 mL/min (ref >60)
Glucose, Bld: 137 mg/dL — ABNORMAL HIGH (ref 70–99)
Potassium: 3.5 mmol/L (ref 3.5–5.1)
Sodium: 137 mmol/L (ref 135–145)
Total Bilirubin: 0.5 mg/dL (ref <1.2)
Total Protein: 6.3 g/dL — ABNORMAL LOW (ref 6.5–8.1)

## 2023-06-02 LAB — CBC
HCT: 39.1 % (ref 36.0–46.0)
Hemoglobin: 12.9 g/dL (ref 12.0–15.0)
MCH: 32.2 pg (ref 26.0–34.0)
MCHC: 33 g/dL (ref 30.0–36.0)
MCV: 97.5 fL (ref 80.0–100.0)
Platelets: 253 10*3/uL (ref 150–400)
RBC: 4.01 MIL/uL (ref 3.87–5.11)
RDW: 13.1 % (ref 11.5–15.5)
WBC: 5.9 10*3/uL (ref 4.0–10.5)
nRBC: 0 % (ref 0.0–0.2)

## 2023-06-02 LAB — LIPASE, BLOOD: Lipase: 36 U/L (ref 11–51)

## 2023-06-02 LAB — TROPONIN I (HIGH SENSITIVITY)
Troponin I (High Sensitivity): 5 ng/L (ref <18)
Troponin I (High Sensitivity): 5 ng/L (ref <18)

## 2023-06-02 LAB — CBG MONITORING, ED: Glucose-Capillary: 143 mg/dL — ABNORMAL HIGH (ref 70–99)

## 2023-06-02 MED ORDER — ALUM & MAG HYDROXIDE-SIMETH 200-200-20 MG/5ML PO SUSP
30.0000 mL | Freq: Once | ORAL | Status: AC
  Administered 2023-06-03: 30 mL via ORAL
  Filled 2023-06-02: qty 30

## 2023-06-02 MED ORDER — PANTOPRAZOLE SODIUM 40 MG IV SOLR
40.0000 mg | Freq: Once | INTRAVENOUS | Status: AC
  Administered 2023-06-03: 40 mg via INTRAVENOUS
  Filled 2023-06-02: qty 10

## 2023-06-02 MED ORDER — NITROGLYCERIN 0.4 MG SL SUBL: 0.4000 mg | SUBLINGUAL_TABLET | SUBLINGUAL | Status: DC | PRN

## 2023-06-02 MED ORDER — ASPIRIN 81 MG PO CHEW: 324.0000 mg | CHEWABLE_TABLET | Freq: Once | ORAL | Status: DC

## 2023-06-02 MED ORDER — IOHEXOL 350 MG/ML SOLN
75.0000 mL | Freq: Once | INTRAVENOUS | Status: AC | PRN
  Administered 2023-06-03: 75 mL via INTRAVENOUS

## 2023-06-02 MED ORDER — FENTANYL CITRATE PF 50 MCG/ML IJ SOSY
25.0000 ug | PREFILLED_SYRINGE | Freq: Once | INTRAMUSCULAR | Status: AC
  Administered 2023-06-02: 25 ug via INTRAVENOUS
  Filled 2023-06-02: qty 1

## 2023-06-02 NOTE — ED Triage Notes (Signed)
Pt c/o chest pain with sob and nausea.

## 2023-06-02 NOTE — ED Triage Notes (Signed)
Pt took 2 nitroglycerin and 324mg  of aspirin at home. Pt states the nitroglycerin helped a little.

## 2023-06-02 NOTE — ED Provider Notes (Signed)
[place] EMERGENCY DEPARTMENT AT [facility] Provider Note   CSN: [id number] Arrival date & time: 06/02/23  2019     History  Chief Complaint  Patient presents with   Chest Pain    Heidi Wallace is a 87 y.o. female.   Chest Pain    Patient has a history of acid reflux hypertension hyperlipidemia coronary artery disease.  Patient was admitted to the hospital in August of this year.  Patient had chest pain and ended up having a cardiac catheterization.  Patient had a 90% proximal LAD stenosis treated with a drug-eluting stent.  Patient also had an OM 2 lesion at 85% managed medically.  Patient states this evening she was eating grilled cheese and potato chips and shortly thereafter she started having pain on the right side of her chest.  Patient tried taking her nitroglycerin and aspirin.  Symptoms have decreased significantly but not completely resolved.  Patient states she is not having any fevers or chills.  No coughing.  No abdominal pain  Home Medications Prior to Admission medications   Medication Sig Start Date End Date Taking? Authorizing Provider  aspirin EC 81 MG tablet Take 81 mg by mouth in the morning. 07/17/18   Malissa Hippo, MD  Cholecalciferol (VITAMIN D3) 50 MCG (2000 UT) capsule Take 1,000 Units by mouth in the morning.    [provider]  clopidogrel (PLAVIX) 75 MG tablet Take 1 tablet (75 mg total) by mouth daily. 03/02/23   Sharlene Dory, NP  esomeprazole (NEXIUM) 40 MG capsule Take 40 mg by mouth daily before breakfast. 11/07/16   [provider]  ezetimibe (ZETIA) 10 MG tablet Take 10 mg by mouth in the morning.    [provider]  gabapentin (NEURONTIN) 300 MG capsule Take 300 mg by mouth daily at 6 PM.    [provider]  metoprolol succinate (TOPROL-XL) 50 MG 24 hr tablet Take 50 mg by mouth every evening.    [provider]  nitroGLYCERIN (NITROSTAT) 0.4 MG SL tablet Place 1 tablet (0.4 mg total)  under the tongue every 5 (five) minutes as needed for chest pain. 09/17/22   Chandrasekhar, Mahesh A, MD  olmesartan (BENICAR) 20 MG tablet TAKE 1 TABLET BY MOUTH EVERY DAY 06/01/23   Jake Bathe, MD  ROCKLATAN 0.02-0.005 % SOLN Place 1 drop into both eyes at bedtime. 12/09/20   [provider]  rosuvastatin (CRESTOR) 5 MG tablet Take 5 mg by mouth every other day. In the morning    [provider]      Allergies    Clindamycin/lincomycin, Levaquin [levofloxacin in d5w], Lipitor [atorvastatin], Morphine, Quinolones, Ceclor [cefaclor], Cephalosporins, Keflex [cephalexin], and Penicillins    Review of Systems   Review of Systems  Cardiovascular:  Positive for chest pain.    Physical Exam Updated Vital Signs BP (!) 161/80   Pulse 79   Temp 97.9 F (36.6 C)   Resp 18   Ht 1.6 m (5\' 3" )   Wt 53 kg   SpO2 98%   BMI 20.70 kg/m  Physical Exam Vitals and nursing note reviewed.  Constitutional:      General: She is not in acute distress.    Appearance: She is well-developed.  HENT:     Head: Normocephalic and atraumatic.     Right Ear: External ear normal.     Left Ear: External ear normal.  Eyes:     General: No scleral icterus.  Right eye: No discharge.        Left eye: No discharge.     Conjunctiva/sclera: Conjunctivae normal.  Neck:     Trachea: No tracheal deviation.  Cardiovascular:     Rate and Rhythm: Normal rate and regular rhythm.  Pulmonary:     Effort: Pulmonary effort is normal. No respiratory distress.     Breath sounds: Normal breath sounds. No stridor. No wheezing or rales.  Abdominal:     General: Bowel sounds are normal. There is no distension.     Palpations: Abdomen is soft.     Tenderness: There is no abdominal tenderness. There is no guarding or rebound.  Musculoskeletal:        General: No tenderness or deformity.     Cervical back: Neck supple.  Skin:    General: Skin is warm and dry.     Findings: No rash.  Neurological:      General: No focal deficit present.     Mental Status: She is alert.     Cranial Nerves: No cranial nerve deficit, dysarthria or facial asymmetry.     Sensory: No sensory deficit.     Motor: No abnormal muscle tone or seizure activity.     Coordination: Coordination normal.  Psychiatric:        Mood and Affect: Mood normal.     ED Results / Procedures / Treatments   Labs (all labs ordered are listed, but only abnormal results are displayed) Labs Reviewed  CBC  COMPREHENSIVE METABOLIC PANEL  LIPASE, BLOOD  CBG MONITORING, ED  TROPONIN I (HIGH SENSITIVITY)    EKG EKG Interpretation Date/Time:  Wednesday June 02 2023 20:27:58 EST Ventricular Rate:  79 PR Interval:  166 QRS Duration:  105 QT Interval:  393 QTC Calculation: 451 R Axis:   -1  Text Interpretation: Sinus rhythm Low voltage, precordial leads RSR' in V1 or V2, right VCD or RVH anterior st and t wave abnormality resolved compared to last ecg Confirmed by Linwood Dibbles [phone]) on 06/02/2023 8:36:52 PM  Radiology No results found.  Procedures Procedures    Medications Ordered in ED Medications  aspirin chewable tablet 324 mg (has no administration in time range)  nitroGLYCERIN (NITROSTAT) SL tablet 0.4 mg (has no administration in time range)  fentaNYL (SUBLIMAZE) injection 25 mcg (has no administration in time range)    ED Course/ Medical Decision Making/ A&P Clinical Course as of 06/04/23 0933  Wed Jun 02, 2023  2129 Initial troponin normal.  Lipase normal.  Metabolic panel normal [JK]  2342 Delta troponin normal. [JK]    Clinical Course User Index [JK] Linwood Dibbles, MD                                 Medical Decision Making DDX includes but not limited to ACS, PE, PTX, aortic syndrome, GERD  Amount and/or Complexity of Data Reviewed Labs: ordered. Radiology: ordered.  Risk OTC drugs. Prescription drug management. Decision regarding hospitalization.   Pt presented to the ED with  complaints of chest pain.  Initial trop normal.  Pt sx improving but not completely resolved.  ?esophageal etiology with her hx of GERD. Pt with history of aortic aneurysm.  Will plan on ct angio. Reassess sx following CT.  Care turned over to Dr Judd Lien         Final Clinical Impression(s) / ED Diagnoses Final diagnoses:  None    Rx /  DC Orders ED Discharge Orders     None         Linwood Dibbles, MD 06/04/23 [phone]

## 2023-06-03 ENCOUNTER — Other Ambulatory Visit (HOSPITAL_COMMUNITY): Payer: Self-pay | Admitting: *Deleted

## 2023-06-03 ENCOUNTER — Observation Stay (HOSPITAL_BASED_OUTPATIENT_CLINIC_OR_DEPARTMENT_OTHER): Payer: Medicare PPO

## 2023-06-03 ENCOUNTER — Encounter (HOSPITAL_COMMUNITY): Payer: Self-pay | Admitting: Internal Medicine

## 2023-06-03 DIAGNOSIS — I2510 Atherosclerotic heart disease of native coronary artery without angina pectoris: Secondary | ICD-10-CM | POA: Diagnosis not present

## 2023-06-03 DIAGNOSIS — R079 Chest pain, unspecified: Secondary | ICD-10-CM | POA: Diagnosis not present

## 2023-06-03 DIAGNOSIS — I200 Unstable angina: Secondary | ICD-10-CM | POA: Diagnosis not present

## 2023-06-03 DIAGNOSIS — R072 Precordial pain: Principal | ICD-10-CM

## 2023-06-03 DIAGNOSIS — I7121 Aneurysm of the ascending aorta, without rupture: Secondary | ICD-10-CM | POA: Diagnosis not present

## 2023-06-03 DIAGNOSIS — I259 Chronic ischemic heart disease, unspecified: Secondary | ICD-10-CM

## 2023-06-03 DIAGNOSIS — I25119 Atherosclerotic heart disease of native coronary artery with unspecified angina pectoris: Secondary | ICD-10-CM

## 2023-06-03 LAB — ECHOCARDIOGRAM LIMITED
Height: 63 in
S' Lateral: 2.7 cm
Weight: 1869.5 [oz_av]

## 2023-06-03 LAB — BASIC METABOLIC PANEL
Anion gap: 6 (ref 5–15)
BUN: 12 mg/dL (ref 8–23)
CO2: 27 mmol/L (ref 22–32)
Calcium: 9.5 mg/dL (ref 8.9–10.3)
Chloride: 105 mmol/L (ref 98–111)
Creatinine, Ser: 0.75 mg/dL (ref 0.44–1.00)
GFR, Estimated: >60 mL/min (ref >60)
Glucose, Bld: 122 mg/dL — ABNORMAL HIGH (ref 70–99)
Potassium: 3.8 mmol/L (ref 3.5–5.1)
Sodium: 138 mmol/L (ref 135–145)

## 2023-06-03 LAB — CBC
HCT: 37.9 % (ref 36.0–46.0)
Hemoglobin: 12.5 g/dL (ref 12.0–15.0)
MCH: 32.1 pg (ref 26.0–34.0)
MCHC: 33 g/dL (ref 30.0–36.0)
MCV: 97.2 fL (ref 80.0–100.0)
Platelets: 228 10*3/uL (ref 150–400)
RBC: 3.9 MIL/uL (ref 3.87–5.11)
RDW: 13 % (ref 11.5–15.5)
WBC: 5.4 10*3/uL (ref 4.0–10.5)
nRBC: 0 % (ref 0.0–0.2)

## 2023-06-03 LAB — PHOSPHORUS: Phosphorus: 3.3 mg/dL (ref 2.5–4.6)

## 2023-06-03 LAB — MAGNESIUM: Magnesium: 2 mg/dL (ref 1.7–2.4)

## 2023-06-03 MED ORDER — EZETIMIBE 10 MG PO TABS
10.0000 mg | ORAL_TABLET | Freq: Every morning | ORAL | Status: DC
  Administered 2023-06-03: 10 mg via ORAL
  Filled 2023-06-03: qty 1

## 2023-06-03 MED ORDER — LIDOCAINE 5 % EX PTCH
1.0000 | MEDICATED_PATCH | CUTANEOUS | Status: DC
  Administered 2023-06-03: 1 via TRANSDERMAL
  Filled 2023-06-03: qty 1

## 2023-06-03 MED ORDER — SODIUM CHLORIDE 0.9 % IV SOLN: INTRAVENOUS | Status: DC

## 2023-06-03 MED ORDER — PANTOPRAZOLE SODIUM 40 MG PO TBEC
40.0000 mg | DELAYED_RELEASE_TABLET | Freq: Every day | ORAL | Status: DC
  Administered 2023-06-03: 40 mg via ORAL
  Filled 2023-06-03: qty 1

## 2023-06-03 MED ORDER — ROSUVASTATIN CALCIUM 10 MG PO TABS: 10.0000 mg | ORAL_TABLET | Freq: Every day | ORAL | Status: DC

## 2023-06-03 MED ORDER — IRBESARTAN 150 MG PO TABS
150.0000 mg | ORAL_TABLET | Freq: Every day | ORAL | Status: DC
  Administered 2023-06-03: 150 mg via ORAL
  Filled 2023-06-03: qty 1

## 2023-06-03 MED ORDER — CLOPIDOGREL BISULFATE 75 MG PO TABS
75.0000 mg | ORAL_TABLET | Freq: Every day | ORAL | Status: DC
  Administered 2023-06-03: 75 mg via ORAL
  Filled 2023-06-03: qty 1

## 2023-06-03 MED ORDER — ISOSORBIDE MONONITRATE ER 30 MG PO TB24: 15.0000 mg | ORAL_TABLET | Freq: Every day | ORAL | 0 refills | Status: DC

## 2023-06-03 MED ORDER — NETARSUDIL-LATANOPROST 0.02-0.005 % OP SOLN: 1.0000 [drp] | Freq: Every day | OPHTHALMIC | Status: DC

## 2023-06-03 MED ORDER — MELATONIN 5 MG PO TABS: 5.0000 mg | ORAL_TABLET | Freq: Every evening | ORAL | Status: DC | PRN

## 2023-06-03 MED ORDER — ACETAMINOPHEN 325 MG PO TABS
650.0000 mg | ORAL_TABLET | Freq: Four times a day (QID) | ORAL | Status: DC | PRN
  Administered 2023-06-03: 650 mg via ORAL
  Filled 2023-06-03: qty 2

## 2023-06-03 MED ORDER — PROCHLORPERAZINE EDISYLATE 10 MG/2ML IJ SOLN: 5.0000 mg | Freq: Four times a day (QID) | INTRAMUSCULAR | Status: DC | PRN

## 2023-06-03 MED ORDER — ENOXAPARIN SODIUM 40 MG/0.4ML IJ SOSY
40.0000 mg | PREFILLED_SYRINGE | INTRAMUSCULAR | Status: DC
  Filled 2023-06-03: qty 0.4

## 2023-06-03 MED ORDER — ISOSORBIDE MONONITRATE ER 30 MG PO TB24
15.0000 mg | ORAL_TABLET | Freq: Every day | ORAL | Status: DC
  Administered 2023-06-03: 15 mg via ORAL
  Filled 2023-06-03: qty 1

## 2023-06-03 MED ORDER — GABAPENTIN 300 MG PO CAPS: 300.0000 mg | ORAL_CAPSULE | Freq: Every day | ORAL | Status: DC

## 2023-06-03 MED ORDER — ROSUVASTATIN CALCIUM 5 MG PO TABS
5.0000 mg | ORAL_TABLET | Freq: Every day | ORAL | Status: DC
  Administered 2023-06-03: 5 mg via ORAL
  Filled 2023-06-03: qty 1

## 2023-06-03 MED ORDER — ASPIRIN 81 MG PO TBEC
81.0000 mg | DELAYED_RELEASE_TABLET | Freq: Every morning | ORAL | Status: DC
  Administered 2023-06-03: 81 mg via ORAL
  Filled 2023-06-03 (×2): qty 1

## 2023-06-03 MED ORDER — METOPROLOL SUCCINATE ER 25 MG PO TB24: 25.0000 mg | ORAL_TABLET | Freq: Every evening | ORAL | Status: DC

## 2023-06-03 MED ORDER — POLYETHYLENE GLYCOL 3350 17 G PO PACK: 17.0000 g | PACK | Freq: Every day | ORAL | Status: DC | PRN

## 2023-06-03 NOTE — Progress Notes (Signed)
   06/03/23 1126  TOC Brief Assessment  Insurance and Status Reviewed  Patient has primary care physician Yes  Home environment has been reviewed from home  Prior level of function: independent  Prior/Current Home Services No current home services  Social Determinants of Health Reivew SDOH reviewed no interventions necessary  Readmission risk has been reviewed Yes  Transition of care needs no transition of care needs at this time    PT recommending outpatient PT. Spoke with pt who states she has an appointment with her PCP next week and she is going to discuss with PCP whether she needs that or not. Pt did not want referral at this time.  Transition of Care Department [facility]) has reviewed patient and no other TOC needs have been identified at this time. We will continue to monitor patient advancement through interdisciplinary progression rounds. If new patient transition needs arise, please place a TOC consult.

## 2023-06-03 NOTE — ED Provider Notes (Signed)
  Physical Exam  BP 111/69   Pulse 60   Temp 97.9 F (36.6 C)   Resp 15   Ht 5\' 3"  (1.6 m)   Wt 53 kg   SpO2 99%   BMI 20.70 kg/m   Physical Exam Vitals and nursing note reviewed.  Constitutional:      Appearance: She is well-developed.  Pulmonary:     Effort: Pulmonary effort is normal.  Musculoskeletal:     Cervical back: Normal range of motion.  Skin:    General: Skin is warm and dry.  Neurological:     Mental Status: She is alert and oriented to person, place, and time.     Procedures  Procedures  ED Course / MDM   Clinical Course as of 06/03/23 0206  Wed Jun 02, 2023  2129 Initial troponin normal.  Lipase normal.  Metabolic panel normal [JK]  2342 Delta troponin normal. [JK]    Clinical Course User Index [JK] Linwood Dibbles, MD   Medical Decision Making Amount and/or Complexity of Data Reviewed Labs: ordered. Radiology: ordered.  Risk OTC drugs. Prescription drug management. Decision regarding hospitalization.   Patient care signed out to me awaiting results of CTA of the chest.  Patient has a known aneurysm of the ascending aorta.  Due to her experiencing chest discomfort, CT was ordered to rule out any issues with this.  This has resulted and shows a 4.1 cm ascending aortic aneurysm without complication.  Patient has been reassessed and according to the daughter is having ongoing discomfort.  She had received fentanyl earlier along with Maalox and Protonix.  I had a lengthy discussion with the patient and daughter and have decided that admission for observation and cardiology consultation in the morning would be most appropriate.  She did recently have a stent placed I believe in June of this year and what she is experiencing feels similar to what she experienced at that time.  I have spoken with the hospitalist who agrees to admit.       Geoffery Lyons, MD 06/03/23 [phone]

## 2023-06-03 NOTE — ED Notes (Signed)
Echo tech at bedside.

## 2023-06-03 NOTE — Evaluation (Signed)
Occupational Therapy Evaluation Patient Details Name: Heidi Wallace MRN: [id number] DOB: 1935-08-04 Today's Date: 06/03/2023   History of Present Illness Heidi Wallace is a 87 y.o. female with medical history significant for coronary artery disease status post NSTEMI, hyperlipidemia, hypertension, PVCs/PACs, GERD, ascending aortic aneurysm measuring 4.1 cm, peripheral artery disease, chronic HFpEF, who presented to the ED from home due to chest pain similar to previous that led to DES stent placement in proximal LAD on 02/19/2023.  Her last visit to her cardiologist was on 05/03/2023.  Also endorses moderate right scapular pain x 1 day.  No reported fevers or chills. (per DO)   Clinical Impression   Pt agreeable to OT and PT co-evaluation. 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 Pt appears to be at or near baseline function for ADL's and functional mobility. WFL B UE strength. Able to ambulate in hall without assist. No observable ADL difficulties. Pt is not recommended for further acute Ot services and will be discharged to care of nursing staff for remaining length of stay.               Functional Status Assessment  Patient has not had a recent decline in their functional status  Equipment Recommendations  None recommended by OT           Precautions / Restrictions Precautions Precautions: Fall Restrictions Weight Bearing Restrictions: No      Mobility Bed Mobility Overal bed mobility: Independent                  Transfers Overall transfer level: Independent                        Balance Overall balance assessment: Mild deficits observed, not formally tested                                         ADL either performed or assessed with clinical judgement   ADL Overall ADL's : Independent                                       General ADL Comments: Able to ambulate  several feet in the hall without assist.     Vision Baseline Vision/History: 1 Wears glasses;3 Glaucoma Ability to See in Adequate Light: 1 Impaired Patient Visual Report: No change from baseline Vision Assessment?: No apparent visual deficits     Perception Perception: Not tested       Praxis Praxis: Not tested       Pertinent Vitals/Pain Pain Assessment Pain Assessment: No/denies pain     Extremity/Trunk Assessment Upper Extremity Assessment Upper Extremity Assessment: Overall WFL for tasks assessed   Lower Extremity Assessment Lower Extremity Assessment: Defer to PT evaluation   Cervical / Trunk Assessment Cervical / Trunk Assessment: Normal   Communication Communication Communication: No apparent difficulties   Cognition Arousal: Alert Behavior During Therapy: WFL for tasks assessed/performed Overall Cognitive Status: Within Functional Limits for tasks assessed  Home Living Family/patient expects to be discharged to:: Private residence Living Arrangements: Spouse/significant other Available Help at Discharge: Family;Available PRN/intermittently Type of Home: House Home Access: Stairs to enter [employer] of Steps: 2 Entrance Stairs-Rails: Right Home Layout: One level     Bathroom Shower/Tub: Producer, television/film/video: Standard Bathroom Accessibility: Yes How Accessible: Accessible via wheelchair;Accessible via walker Home Equipment: Shower seat   Additional Comments: takes care of husband that has dementia. Has people who are hired to help with cleaning and yard care.      Prior Functioning/Environment Prior Level of Function : Independent/Modified Independent                                                Co-evaluation PT/OT/SLP Co-Evaluation/Treatment: Yes Reason for Co-Treatment: To address functional/ADL transfers   OT goals  addressed during session: ADL's and self-care      AM-PAC OT "6 Clicks" Daily Activity     Outcome Measure Help from another person eating meals?: None Help from another person taking care of personal grooming?: None Help from another person toileting, which includes using toliet, bedpan, or urinal?: None Help from another person bathing (including washing, rinsing, drying)?: None Help from another person to put on and taking off regular upper body clothing?: None Help from another person to put on and taking off regular lower body clothing?: None 6 Click Score: 24   End of Session    Activity Tolerance: Patient tolerated treatment well Patient left: in bed;with call bell/phone within reach  OT Visit Diagnosis: Unsteadiness on feet (R26.81)                Time: 1191-4782 OT Time Calculation (min): 10 min Charges:  OT General Charges $OT Visit: 1 Visit OT Evaluation $OT Eval Low Complexity: 1 Low  Geanette Buonocore OT, MOT   Danie Chandler 06/03/2023, 9:09 AM

## 2023-06-03 NOTE — Discharge Summary (Signed)
Physician Discharge Summary  Heidi Wallace XBJ:[id number] DOB: [birth year] DOA: 06/02/2023  PCP: Estanislado Pandy, MD  Admit date: 06/02/2023 Discharge date: 06/03/2023  Admitted From: Home Disposition: Home  Recommendations for Outpatient Follow-up:  Follow up with PCP in 1-2 weeks Cardiology follow-up as scheduled  Home Health: N/A Equipment/Devices: N/A  Discharge Condition: Stable CODE STATUS: Full code Diet recommendation: Low-salt diet  Discharge summary: 87 year old with known history of coronary artery disease, recent cath 01/2023 in the setting of unstable angina showing two-vessel coronary artery disease, DES of LAD, multiple small vessel disease, known PACs, hypertension, hyperlipidemia and ascending aortic aneurysm presented with midsternal chest pain and spasm after eating cheese sandwich.  She had nausea and shortness of breath.  Pain remained despite taking nitro at home so came to the ER.  In the emergency room she received IV Protonix, fentanyl and Maalox that did not help much but patient had a spontaneous resolution of pain in 3 to 4 hours.  In the emergency room hemodynamically stable.  Troponins negative.  Chest x-ray with probable emphysematous changes.  CT angiogram with 4.1 cm ascending aortic aneurysm with annual follow-up recommended.  No evidence of dissection or occlusion.  No evidence of PE.  EKG nonischemic.  Limited echocardiogram without evidence of wall motion abnormality.  Chest pain, acute coronary syndrome ruled out: Seen by cardiology.  Currently asymptomatic. Already on dual antiplatelet therapy and that was continued.  She is already on ACE, Toprol XL, Crestor that is continued. Imdur 15 mg was added and prescribed on discharge.  Cardiology to schedule outpatient follow-up. Remains overall stable to discharge home.     Discharge Diagnoses:  Principal Problem:   Chest pain    Discharge Instructions  Discharge Instructions     Diet - low  sodium heart healthy   Complete by: As directed    Increase activity slowly   Complete by: As directed       Allergies as of 06/03/2023       Reactions   Clindamycin/lincomycin Other (See Comments)   c-diff    Levaquin [levofloxacin In D5w] Itching   At IV site including red streaking   Lipitor [atorvastatin] Other (See Comments)   myalgia   Morphine Other (See Comments)   REACTION: Patient states that her mother coded when given this medication therefore patient refuses to take   Quinolones Other (See Comments)   Hx of aortic aneurysm   Ceclor [cefaclor] Rash   Cephalosporins Rash   Keflex [cephalexin] Rash   Penicillins Hives, Rash        Medication List     TAKE these medications    aspirin EC 81 MG tablet Take 81 mg by mouth in the morning.   clopidogrel 75 MG tablet Commonly known as: PLAVIX Take 1 tablet (75 mg total) by mouth daily.   esomeprazole 40 MG capsule Commonly known as: NEXIUM Take 40 mg by mouth daily before breakfast.   ezetimibe 10 MG tablet Commonly known as: ZETIA Take 10 mg by mouth in the morning.   gabapentin 300 MG capsule Commonly known as: NEURONTIN Take 300 mg by mouth daily at 6 PM.   isosorbide mononitrate 30 MG 24 hr tablet Commonly known as: IMDUR Take 0.5 tablets (15 mg total) by mouth daily. Start taking on: June 04, 2023   metoprolol succinate 50 MG 24 hr tablet Commonly known as: TOPROL-XL Take 50 mg by mouth every evening.   nitroGLYCERIN 0.4 MG SL tablet Commonly known as: NITROSTAT  Place 1 tablet (0.4 mg total) under the tongue every 5 (five) minutes as needed for chest pain.   olmesartan 20 MG tablet Commonly known as: BENICAR TAKE 1 TABLET BY MOUTH EVERY DAY   Rocklatan 0.02-0.005 % Soln Generic drug: Netarsudil-Latanoprost Place 1 drop into both eyes at bedtime.   rosuvastatin 5 MG tablet Commonly known as: CRESTOR Take 5 mg by mouth daily. In the morning        Follow-up Information      Sharlene Dory, NP Follow up on 07/02/2023.   Specialty: Cardiology Why: Cardiology Follow-up on 07/02/2023 at 1:30 PM. Contact information: [address] [place] [address] [phone]                Allergies  Allergen Reactions   Clindamycin/Lincomycin Other (See Comments)    c-diff    Levaquin [Levofloxacin In D5w] Itching    At IV site including red streaking   Lipitor [Atorvastatin] Other (See Comments)    myalgia   Morphine Other (See Comments)    REACTION: Patient states that her mother coded when given this medication therefore patient refuses to take   Quinolones Other (See Comments)    Hx of aortic aneurysm   Ceclor [Cefaclor] Rash   Cephalosporins Rash   Keflex [Cephalexin] Rash   Penicillins Hives and Rash    Consultations: Cardiology   Procedures/Studies: ECHOCARDIOGRAM LIMITED  Result Date: 06/03/2023    ECHOCARDIOGRAM LIMITED REPORT   Patient Name:   Heidi Wallace Date of Exam: 06/03/2023 Medical Rec #:  [id number]        Height:       63.0 in Accession #:    [id number]       Weight:       116.8 lb Date of Birth:  1936/05/14        BSA:          1.539 m Patient Age:    87 years         BP:           137/63 mmHg Patient Gender: F                HR:           68 bpm. [facility] Procedure: Limited Echo and Color Doppler Indications:    Chest Pain R07.9  History:        Patient has prior history of Echocardiogram examinations, most                 recent 02/20/2023. CAD and Previous Myocardial Infarction,                 Arrythmias:PVC; Risk Factors:Hypertension and Dyslipidemia.  Sonographer:    Celesta Gentile RCS Referring Phys: [id number] CAROLE N HALL IMPRESSIONS  1. Limited study.  2. Left ventricular ejection fraction, by estimation, is 60 to 65%. The left ventricle has normal function. The left ventricle has no regional wall motion abnormalities. There is mild concentric left ventricular hypertrophy.  3. Right ventricular systolic function  is normal. The right ventricular size is normal.  4. The mitral valve is degenerative. Trivial mitral valve regurgitation.  5. The aortic valve is tricuspid. There is mild calcification of the aortic valve. Aortic valve regurgitation is mild. Aortic valve sclerosis is present, with no evidence of aortic valve stenosis.  6. The inferior vena cava is normal in size with greater than 50% respiratory variability, suggesting right atrial pressure of  3 mmHg. Comparison(s): Prior images reviewed side by side. LVEF remains normal range at 60-65%. FINDINGS  Left Ventricle: Left ventricular ejection fraction, by estimation, is 60 to 65%. The left ventricle has normal function. The left ventricle has no regional wall motion abnormalities. The left ventricular internal cavity size was normal in size. There is  mild concentric left ventricular hypertrophy. Right Ventricle: The right ventricular size is normal. No increase in right ventricular wall thickness. Right ventricular systolic function is normal. Pericardium: Trivial pericardial effusion is present. The pericardial effusion is posterior to the left ventricle. Mitral Valve: The mitral valve is degenerative in appearance. Mild to moderate mitral annular calcification. Trivial mitral valve regurgitation. Tricuspid Valve: The tricuspid valve is grossly normal. Tricuspid valve regurgitation is trivial. Aortic Valve: The aortic valve is tricuspid. There is mild calcification of the aortic valve. There is mild aortic valve annular calcification. Aortic valve regurgitation is mild. Aortic valve sclerosis is present, with no evidence of aortic valve stenosis. Pulmonic Valve: The pulmonic valve was grossly normal. Pulmonic valve regurgitation is mild. Aorta: The aortic root is normal in size and structure. Venous: The inferior vena cava is normal in size with greater than 50% respiratory variability, suggesting right atrial pressure of 3 mmHg. IAS/Shunts: The interatrial septum  was not assessed. LEFT VENTRICLE PLAX 2D LVIDd:         4.30 cm LVIDs:         2.70 cm LV PW:         1.10 cm LV IVS:        0.90 cm LVOT diam:     1.90 cm LVOT Area:     2.84 cm  RIGHT VENTRICLE TAPSE (M-mode): 1.9 cm LEFT ATRIUM         Index LA diam:    3.30 cm 2.14 cm/m   AORTA Ao Root diam: 3.40 cm  SHUNTS Systemic Diam: 1.90 cm Nona Dell MD Electronically signed by Nona Dell MD Signature Date/Time: 06/03/2023/10:42:03 AM    Final    CT Angio Chest Aorta W and/or Wo Contrast  Result Date: 06/03/2023 CLINICAL DATA:  Acute aortic syndrome suspected. Chest pain, shortness of breath, and nausea. EXAM: CT ANGIOGRAPHY CHEST WITH CONTRAST TECHNIQUE: Multidetector CT imaging of the chest was performed using the standard protocol during bolus administration of intravenous contrast. Multiplanar CT image reconstructions and MIPs were obtained to evaluate the vascular anatomy. RADIATION DOSE REDUCTION: This exam was performed according to the departmental dose-optimization program which includes automated exposure control, adjustment of the mA and/or kV according to patient size and/or use of iterative reconstruction technique. CONTRAST:  75mL OMNIPAQUE IOHEXOL 350 MG/ML SOLN COMPARISON:  Chest radiograph 06/02/2023.  CT chest 05/10/2019 FINDINGS: Cardiovascular: Ascending aortic aneurysm measuring 4.1 cm diameter. No change since prior study. No aortic dissection. Great vessel origins are patent. Calcification of the aorta and coronary arteries. Cardiac enlargement. No pericardial effusions. Contrast opacification of the pulmonary arteries is not sufficient for evaluation of possibility of pulmonary embolus. Mediastinum/Nodes: Thyroid gland is unremarkable. Esophagus is decompressed. No significant lymphadenopathy. Lungs/Pleura: Emphysematous changes and scattered fibrosis in the lungs. Mild atelectasis in the lung bases. No airspace disease or consolidation. No pleural effusions. No pneumothorax. Upper  Abdomen: No acute abnormality. Musculoskeletal: Degenerative changes in the spine. No acute bony abnormalities. Review of the MIP images confirms the above findings. IMPRESSION: 1. 4.1 cm diameter ascending aortic aneurysm. Recommend annual imaging followup by CTA or MRA. This recommendation follows 2010 ACCF/AHA/AATS/ACR/ASA/SCA/SCAI/SIR/STS/SVM Guidelines for the Diagnosis and  Management of Patients with Thoracic Aortic Disease. Circulation. 2010; 121: U045-W098. Aortic aneurysm NOS (ICD10-I71.9) 2. No aortic dissection or occlusion. Moderate aortic atherosclerosis. 3. Emphysematous changes in the lungs with scattered fibrosis. Atelectasis in the lung bases. No focal consolidation. 4. Cardiac enlargement. Electronically Signed   By: Burman Nieves M.D.   On: 06/03/2023 01:32   DG Chest Portable 1 View  Result Date: 06/02/2023 CLINICAL DATA:  Chest pain and discomfort. EXAM: PORTABLE CHEST 1 VIEW COMPARISON:  02/19/2023 FINDINGS: Shallow inspiration. Mild cardiac enlargement. No vascular congestion, edema, or consolidation. Probable emphysematous changes in the lungs. Central interstitial changes likely chronic bronchitis. No change since prior study. Calcification of the aorta. Degenerative changes in the spine and shoulder. IMPRESSION: Probable emphysematous and chronic bronchitic changes in the lungs. Mild cardiac enlargement. No evidence of active pulmonary disease. Electronically Signed   By: Burman Nieves M.D.   On: 06/02/2023 22:20   (Echo, Carotid, EGD, Colonoscopy, ERCP)    Subjective: Patient seen and examined.  She was still in the emergency room.  She had discussed with cardiology.  Currently without any symptoms.  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   Discharge Exam: Vitals:   06/03/23 0700 06/03/23 1034  BP: (!) 195/76 (!) 149/93  Pulse:  70  Resp: 19 16  Temp:  97.7 F (36.5 C)  SpO2:  96%   Vitals:   06/03/23 0400  06/03/23 0430 06/03/23 0700 06/03/23 1034  BP: (!) 144/59 (!) 153/128 (!) 195/76 (!) 149/93  Pulse: 62 82  70  Resp: 14 (!) 32 19 16  Temp:    97.7 F (36.5 C)  TempSrc:    Oral  SpO2: 96% 96%  96%  Weight:      Height:        General: Pt is alert, awake, not in acute distress Cardiovascular: RRR, S1/S2 +, no rubs, no gallops Respiratory: CTA bilaterally, no wheezing, no rhonchi Abdominal: Soft, NT, ND, bowel sounds + Extremities: no edema, no cyanosis    The results of significant diagnostics from this hospitalization (including imaging, microbiology, ancillary and laboratory) are listed below for reference.     Microbiology: No results found for this or any previous visit (from the past 240 hour(s)).   Labs: BNP (last 3 results) No results for input(s): "BNP" in the last 8760 hours. Basic Metabolic Panel: Recent Labs  Lab 06/02/23 2030 06/03/23 0310  NA 137 138  K 3.5 3.8  CL 103 105  CO2 25 27  GLUCOSE 137* 122*  BUN 16 12  CREATININE 0.90 0.75  CALCIUM 9.4 9.5  MG  --  2.0  PHOS  --  3.3   Liver Function Tests: Recent Labs  Lab 06/02/23 2030  AST 20  ALT 13  ALKPHOS 98  BILITOT 0.5  PROT 6.3*  ALBUMIN 3.9   Recent Labs  Lab 06/02/23 2030  LIPASE 36   No results for input(s): "AMMONIA" in the last 168 hours. CBC: Recent Labs  Lab 06/02/23 2030 06/03/23 0310  WBC 5.9 5.4  HGB 12.9 12.5  HCT 39.1 37.9  MCV 97.5 97.2  PLT 253 228   Cardiac Enzymes: No results for input(s): "CKTOTAL", "CKMB", "CKMBINDEX", "TROPONINI" in the last 168 hours. BNP: Invalid input(s): "POCBNP" CBG: Recent Labs  Lab 06/02/23 2100  GLUCAP 143*   D-Dimer No results for input(s): "DDIMER" in the last 72 hours. Hgb A1c No results for input(s): "HGBA1C"  in the last 72 hours. Lipid Profile No results for input(s): "CHOL", "HDL", "LDLCALC", "TRIG", "CHOLHDL", "LDLDIRECT" in the last 72 hours. Thyroid function studies No results for input(s): "TSH", "T4TOTAL",  "T3FREE", "THYROIDAB" in the last 72 hours.  Invalid input(s): "FREET3" Anemia work up No results for input(s): "VITAMINB12", "FOLATE", "FERRITIN", "TIBC", "IRON", "RETICCTPCT" in the last 72 hours. Urinalysis    Component Value Date/Time   COLORURINE YELLOW 02/20/2017 1914   APPEARANCEUR CLEAR 02/20/2017 1914   LABSPEC 1.016 02/20/2017 1914   PHURINE 8.0 02/20/2017 1914   GLUCOSEU NEGATIVE 02/20/2017 1914   HGBUR NEGATIVE 02/20/2017 1914   BILIRUBINUR NEGATIVE 02/20/2017 1914   KETONESUR 5 (A) 02/20/2017 1914   PROTEINUR NEGATIVE 02/20/2017 1914   NITRITE NEGATIVE 02/20/2017 1914   LEUKOCYTESUR TRACE (A) 02/20/2017 1914   Sepsis Labs Recent Labs  Lab 06/02/23 2030 06/03/23 0310  WBC 5.9 5.4   Microbiology No results found for this or any previous visit (from the past 240 hour(s)).   Time coordinating discharge:  25 minutes  SIGNED:   Dorcas Carrow, MD  [facility] 06/03/2023, 11:06 AM

## 2023-06-03 NOTE — H&P (Addendum)
History and Physical  Heidi Wallace NWG:[id number] DOB: Apr 10, 1936 DOA: 06/02/2023  Referring physician: Dr. Judd Lien, EDP  PCP: Estanislado Pandy, MD  Outpatient Specialists: Cardiology. Patient coming from: Home.  Chief Complaint: Chest pain.  HPI: Heidi Wallace is a 87 y.o. female with medical history significant for coronary artery disease status post NSTEMI, hyperlipidemia, hypertension, PVCs/PACs, GERD, ascending aortic aneurysm measuring 4.1 cm, peripheral artery disease, chronic HFpEF, who presented to the ED from home due to chest pain similar to previous that led to DES stent placement in proximal LAD on 02/19/2023.  Her last visit to her cardiologist was on 05/03/2023.  Also endorses moderate right scapular pain x 1 day.  No reported fevers or chills.  In the ED, vital signs are stable.  High-sensitivity troponin negative x 2.  CT angio chest aorta with and without contrast was negative for aortic dissection.  Due to persistent symptomatology, EDP requested admission for further evaluation and management.  Admitted by [facility], hospitalist service.  ED Course: Temperature 97.9.  BP 111/69, pulse 60, respiration rate 15, saturation 99% on room air.  Review of Systems: Review of systems as noted in the HPI. All other systems reviewed and are negative.   Past Medical History:  Diagnosis Date   Chest pain    Chronic kidney disease    Coronary artery disease    cardiac cath 08/2011: heavily calcified coronary arteries without obstructive disease. 40% proximal LAD, 40% in OMs.    GERD (gastroesophageal reflux disease)    Headache    History of nuclear stress test 2017   low risk study   Hyperlipidemia    Hypertension    Post concussion syndrome    PVC's (premature ventricular contractions)    PVCs and ventricular bigeminy with abnormal nuclear test   Past Surgical History:  Procedure Laterality Date   ABDOMINAL AORTOGRAM W/LOWER EXTREMITY N/A 11/18/2021   Procedure: ABDOMINAL  AORTOGRAM W/LOWER EXTREMITY;  Surgeon: Nada Libman, MD;  Location: [facility] INVASIVE CV LAB;  Service: Cardiovascular;  Laterality: N/A;   BIOPSY  07/07/2018   Procedure: BIOPSY;  Surgeon: Malissa Hippo, MD;  Location: [facility] ENDO SUITE;  Service: Endoscopy;;  gastric polyp   BREAST CYST EXCISION  benign right breast   1982   CARDIAC CATHETERIZATION  07/2011   [facility]; [facility]   COLONOSCOPY N/A 07/07/2018   Procedure: COLONOSCOPY;  Surgeon: Malissa Hippo, MD;  Location: [facility];  Service: Endoscopy;  Laterality: N/A;  12:25   CORONARY STENT INTERVENTION N/A 02/19/2023   Procedure: CORONARY STENT INTERVENTION;  Surgeon: Swaziland, Peter M, MD;  Location: [facility] INVASIVE CV LAB;  Service: Cardiovascular;  Laterality: N/A;   ESOPHAGOGASTRODUODENOSCOPY N/A 07/07/2018   Procedure: ESOPHAGOGASTRODUODENOSCOPY (EGD);  Surgeon: Malissa Hippo, MD;  Location: [facility];  Service: Endoscopy;  Laterality: N/A;   GALLBLADDER SURGERY  2003   LEFT HEART CATH AND CORONARY ANGIOGRAPHY N/A 02/19/2023   Procedure: LEFT HEART CATH AND CORONARY ANGIOGRAPHY;  Surgeon: Swaziland, Peter M, MD;  Location: [facility] INVASIVE CV LAB;  Service: Cardiovascular;  Laterality: N/A;   PERIPHERAL VASCULAR INTERVENTION  11/18/2021   Procedure: PERIPHERAL VASCULAR INTERVENTION;  Surgeon: Nada Libman, MD;  Location: [facility] INVASIVE CV LAB;  Service: Cardiovascular;;  right SFA   POLYPECTOMY  07/07/2018   Procedure: POLYPECTOMY;  Surgeon: Malissa Hippo, MD;  Location: [facility];  Service: Endoscopy;;  colon    ROTATOR CUFF REPAIR  right 1989   TUBAL LIGATION  1974  Social History:  reports that she quit smoking about 50 years ago. Her smoking use included cigarettes. She started smoking about 66 years ago. She has a 16 pack-year smoking history. She has never been exposed to tobacco smoke. She has never used smokeless tobacco. She reports that she does not drink alcohol and does not use drugs.   Allergies  Allergen Reactions    Clindamycin/Lincomycin Other (See Comments)    c-diff    Levaquin [Levofloxacin In D5w] Itching    At IV site including red streaking   Lipitor [Atorvastatin] Other (See Comments)    myalgia   Morphine Other (See Comments)    REACTION: Patient states that her mother coded when given this medication therefore patient refuses to take   Quinolones Other (See Comments)    Hx of aortic aneurysm   Ceclor [Cefaclor] Rash   Cephalosporins Rash   Keflex [Cephalexin] Rash   Penicillins Hives and Rash    Family History  Problem Relation Age of Onset   Heart disease Father    Heart disease Mother       Prior to Admission medications   Medication Sig Start Date End Date Taking? Authorizing Provider  aspirin EC 81 MG tablet Take 81 mg by mouth in the morning. 07/17/18  Yes Rehman, Joline Maxcy, MD  clopidogrel (PLAVIX) 75 MG tablet Take 1 tablet (75 mg total) by mouth daily. 03/02/23  Yes Sharlene Dory, NP  esomeprazole (NEXIUM) 40 MG capsule Take 40 mg by mouth daily before breakfast. 11/07/16  Yes [provider]  ezetimibe (ZETIA) 10 MG tablet Take 10 mg by mouth in the morning.   Yes [provider]  gabapentin (NEURONTIN) 300 MG capsule Take 300 mg by mouth daily at 6 PM.   Yes [provider]  metoprolol succinate (TOPROL-XL) 50 MG 24 hr tablet Take 50 mg by mouth every evening.   Yes [provider]  nitroGLYCERIN (NITROSTAT) 0.4 MG SL tablet Place 1 tablet (0.4 mg total) under the tongue every 5 (five) minutes as needed for chest pain. 09/17/22  Yes Chandrasekhar, Mahesh A, MD  olmesartan (BENICAR) 20 MG tablet TAKE 1 TABLET BY MOUTH EVERY DAY 06/01/23  Yes Skains, Veverly Fells, MD  ROCKLATAN 0.02-0.005 % SOLN Place 1 drop into both eyes at bedtime. 12/09/20  Yes [provider]  rosuvastatin (CRESTOR) 5 MG tablet Take 5 mg by mouth daily. In the morning   Yes [provider]    Physical Exam: BP 111/69   Pulse 60   Temp 97.9 F (36.6 C)    Resp 15   Ht 5\' 3"  (1.6 m)   Wt 53 kg   SpO2 99%   BMI 20.70 kg/m   General: 87 y.o. year-old female well developed well nourished in no acute distress.  Alert and oriented x3. Cardiovascular: Regular rate and rhythm with no rubs or gallops.  No thyromegaly or JVD noted.  No lower extremity edema. 2/4 pulses in all 4 extremities. Respiratory: Clear to auscultation with no wheezes or rales. Good inspiratory effort. Abdomen: Soft nontender nondistended with normal bowel sounds x4 quadrants. Muskuloskeletal: No cyanosis, clubbing or edema noted bilaterally Neuro: CN II-XII intact, strength, sensation, reflexes Skin: No ulcerative lesions noted or rashes Psychiatry: Judgement and insight appear normal. Mood is appropriate for condition and setting          Labs on Admission:  Basic Metabolic Panel: Recent Labs  Lab 06/02/23 2030  NA 137  K 3.5  CL 103  CO2 25  GLUCOSE 137*  BUN 16  CREATININE 0.90  CALCIUM 9.4   Liver Function Tests: Recent Labs  Lab 06/02/23 2030  AST 20  ALT 13  ALKPHOS 98  BILITOT 0.5  PROT 6.3*  ALBUMIN 3.9   Recent Labs  Lab 06/02/23 2030  LIPASE 36   No results for input(s): "AMMONIA" in the last 168 hours. CBC: Recent Labs  Lab 06/02/23 2030  WBC 5.9  HGB 12.9  HCT 39.1  MCV 97.5  PLT 253   Cardiac Enzymes: No results for input(s): "CKTOTAL", "CKMB", "CKMBINDEX", "TROPONINI" in the last 168 hours.  BNP (last 3 results) No results for input(s): "BNP" in the last 8760 hours.  ProBNP (last 3 results) No results for input(s): "PROBNP" in the last 8760 hours.  CBG: Recent Labs  Lab 06/02/23 2100  GLUCAP 143*    Radiological Exams on Admission: CT Angio Chest Aorta W and/or Wo Contrast  Result Date: 06/03/2023 CLINICAL DATA:  Acute aortic syndrome suspected. Chest pain, shortness of breath, and nausea. EXAM: CT ANGIOGRAPHY CHEST WITH CONTRAST TECHNIQUE: Multidetector CT imaging of the chest was performed using the standard  protocol during bolus administration of intravenous contrast. Multiplanar CT image reconstructions and MIPs were obtained to evaluate the vascular anatomy. RADIATION DOSE REDUCTION: This exam was performed according to the departmental dose-optimization program which includes automated exposure control, adjustment of the mA and/or kV according to patient size and/or use of iterative reconstruction technique. CONTRAST:  75mL OMNIPAQUE IOHEXOL 350 MG/ML SOLN COMPARISON:  Chest radiograph 06/02/2023.  CT chest 05/10/2019 FINDINGS: Cardiovascular: Ascending aortic aneurysm measuring 4.1 cm diameter. No change since prior study. No aortic dissection. Great vessel origins are patent. Calcification of the aorta and coronary arteries. Cardiac enlargement. No pericardial effusions. Contrast opacification of the pulmonary arteries is not sufficient for evaluation of possibility of pulmonary embolus. Mediastinum/Nodes: Thyroid gland is unremarkable. Esophagus is decompressed. No significant lymphadenopathy. Lungs/Pleura: Emphysematous changes and scattered fibrosis in the lungs. Mild atelectasis in the lung bases. No airspace disease or consolidation. No pleural effusions. No pneumothorax. Upper Abdomen: No acute abnormality. Musculoskeletal: Degenerative changes in the spine. No acute bony abnormalities. Review of the MIP images confirms the above findings. IMPRESSION: 1. 4.1 cm diameter ascending aortic aneurysm. Recommend annual imaging followup by CTA or MRA. This recommendation follows 2010 ACCF/AHA/AATS/ACR/ASA/SCA/SCAI/SIR/STS/SVM Guidelines for the Diagnosis and Management of Patients with Thoracic Aortic Disease. Circulation. 2010; 121: W098-J191. Aortic aneurysm NOS (ICD10-I71.9) 2. No aortic dissection or occlusion. Moderate aortic atherosclerosis. 3. Emphysematous changes in the lungs with scattered fibrosis. Atelectasis in the lung bases. No focal consolidation. 4. Cardiac enlargement. Electronically Signed   By:  Burman Nieves M.D.   On: 06/03/2023 01:32   DG Chest Portable 1 View  Result Date: 06/02/2023 CLINICAL DATA:  Chest pain and discomfort. EXAM: PORTABLE CHEST 1 VIEW COMPARISON:  02/19/2023 FINDINGS: Shallow inspiration. Mild cardiac enlargement. No vascular congestion, edema, or consolidation. Probable emphysematous changes in the lungs. Central interstitial changes likely chronic bronchitis. No change since prior study. Calcification of the aorta. Degenerative changes in the spine and shoulder. IMPRESSION: Probable emphysematous and chronic bronchitic changes in the lungs. Mild cardiac enlargement. No evidence of active pulmonary disease. Electronically Signed   By: Burman Nieves M.D.   On: 06/02/2023 22:20    EKG: I independently viewed the EKG done and my findings are as followed: Sinus rhythm rate of 79.  Nonspecific ST-T changes.  QTc 451.  Assessment/Plan Present on Admission:  Chest pain  Principal Problem:   Chest pain  Chest pain, rule out ACS High-sensitivity troponin negative x 2 No evidence of acute ischemia on 12 EKG Follow 2D echo limited Closely monitor on telemetry Consider cardiology evaluation in the morning  Coronary artery disease status post PCI with stent placement, POA Resume home regimen Follow 2D echo, limited.  Right scapular pain, suspect musculoskeletal Lidocaine patch PT  Hyperlipidemia Resume home regimen.  Chronic HFpEF Resume home regimen Monitor strict I's and O's and daily weight Closely monitor volume status while on gentle IV fluid hydration  Peripheral vascular disease status post stent placement in right lower extremity No acute issues Resume home regimen  GERD Resume home regimen.  Polyneuropathy Resume home regimen  Hypertension Resume home oral antihypertensives Closely monitor vital signs  Ascending aortic aneurysm measuring 4.1 cm Continue surveillance per guidelines Gentle IV fluid hydration post IV contrast NS  at 40 cc/h x 1 day. Goal blood pressure normotensive.   Time: 75 minutes.   DVT prophylaxis: Subcu Lovenox daily  Code Status: Full code  Family Communication: None at bedside.  Disposition Plan: Admitted to telemetry unit.  Consults called: None.  Admission status: Observation status.   Status is: Observation    Darlin Drop MD [facility] Pager [phone]  If 7PM-7AM, please contact night-coverage [email or web address] Password [email or web address]  06/03/2023, 2:09 AM

## 2023-06-03 NOTE — ED Notes (Signed)
Echo at bedside

## 2023-06-03 NOTE — Consult Note (Addendum)
Cardiology Consultation   Patient ID: SHAWNISHA CSONKA MRN: [id number]; DOB: [birth year]  Admit date: 06/02/2023 Date of Consult: 06/03/2023  PCP:  Estanislado Pandy, MD   [facility] Providers Cardiologist:  Donato Schultz, MD        Patient Profile:   Heidi Wallace is a 87 y.o. female with a hx of CAD (nonobstructive disease by cath in 2013, recent cath in 01/2023 in the setting of unstable angina showing 2-vessel CAD with 90% proximal LAD treated with DES placement and 85% stenosis along small caliber 2nd Mrg with medical management recommended given small vessel size and tortuosity), palpitations (known PAC's and PVC's), HTN, HLD, PAD (s/p right popliteal stenting in 10/2021) and ascending aortic aneurysm who is being seen 06/03/2023 for the evaluation of chest pain at the request of Dr. Jerral Ralph.  History of Present Illness:   Heidi Wallace was admitted in 01/2023 for chest pain and given his concerns for unstable angina (Hs troponin values mildly elevated from 42 - 50 during admission), she was transferred to [facility] for a cardiac catheterization with details as discussed above.  Underwent DES placement to the proximal LAD with medical management recommended of a small caliber OM 2 given the small vessel size and tortuosity. Was discharged on ASA, Brilinta, Crestor 5 mg daily (previously intolerant to higher intensity statin therapy), Zetia 10 mg daily and Toprol-XL 50 mg daily. She did follow-up with Sharlene Dory, NP in 02/2023 and reported shortness of breath ever since her procedure and since being on Brilinta. This was reviewed with Pharm.D. and she was switched from Brilinta to Plavix with 600 mg for the first day followed by 75 mg daily. She had another follow-up visit on 05/03/2023 and her respiratory status had improved since the medication adjustment.  No changes are made to medications at that time.  She presented to [facility] ED yesterday evening for evaluation of chest  pain, shortness of breath and nausea. In talking with the patient today, she reports she has been in her normal state of health this past week as she has been active around the home in putting out Christmas decorations. Yesterday evening, she developed chest pain which occurred after consuming a grilled cheese sandwich, potato chips and pickles. She took nitroglycerin x2 at home with slight improvement of symptoms but given this did not completely resolve, she came to the ED for further evaluation. While in the ED, she did receive IV Protonix along with Fentanyl and Maalox/Mylanta. Reports these did not significantly help with her pain either and pain persisted for over 3 to 4 hours and then spontaneously resolved. She feels back to baseline this morning. She does report being under increased stress as she is the primary caregiver for her husband who has vascular dementia.  Initial labs showed WBC 5.9, Hgb 12.9, platelets 253, Na+ 137, K+ 3.5 and creatinine 0.90. AST 20 and ALT 13. Initial and repeat Hs Troponin negative at 5. CXR with probable emphysematous and chronic bronchitis changes in the lungs but no acute abnormalities. CTA showed a 4.1 cm ascending aortic aneurysm with annual follow-up recommended. No evidence of dissection or occlusion. EKG showed normal sinus rhythm, heart rate 79 with TWI actually improved along the anterior leads as compared to prior tracings from 01/2023.   Past Medical History:  Diagnosis Date   Chest pain    Chronic kidney disease    Coronary artery disease    a.  cath in 01/2023 in the setting  of unstable angina showing 2-vessel CAD with 90% proximal LAD treated with DES placement and 85% stenosis along small caliber 2nd Mrg with medical management recommended given small vessel size and tortuosity   GERD (gastroesophageal reflux disease)    Headache    History of nuclear stress test 2017   low risk study   Hyperlipidemia    Hypertension    Post concussion syndrome     PVC's (premature ventricular contractions)    PVCs and ventricular bigeminy with abnormal nuclear test    Past Surgical History:  Procedure Laterality Date   ABDOMINAL AORTOGRAM W/LOWER EXTREMITY N/A 11/18/2021   Procedure: ABDOMINAL AORTOGRAM W/LOWER EXTREMITY;  Surgeon: Nada Libman, MD;  Location: [facility] INVASIVE CV LAB;  Service: Cardiovascular;  Laterality: N/A;   BIOPSY  07/07/2018   Procedure: BIOPSY;  Surgeon: Malissa Hippo, MD;  Location: [facility]O [facility];  Service: Endoscopy;;  gastric polyp   BREAST CYST EXCISION  benign right breast   1982   CARDIAC CATHETERIZATION  07/2011   [facility]; [facility]   COLONOSCOPY N/A 07/07/2018   Procedure: COLONOSCOPY;  Surgeon: Malissa Hippo, MD;  Location: [facility];  Service: Endoscopy;  Laterality: N/A;  12:25   CORONARY STENT INTERVENTION N/A 02/19/2023   Procedure: CORONARY STENT INTERVENTION;  Surgeon: Swaziland, Peter M, MD;  Location: [facility] INVASIVE CV LAB;  Service: Cardiovascular;  Laterality: N/A;   ESOPHAGOGASTRODUODENOSCOPY N/A 07/07/2018   Procedure: ESOPHAGOGASTRODUODENOSCOPY (EGD);  Surgeon: Malissa Hippo, MD;  Location: [facility];  Service: Endoscopy;  Laterality: N/A;   GALLBLADDER SURGERY  2003   LEFT HEART CATH AND CORONARY ANGIOGRAPHY N/A 02/19/2023   Procedure: LEFT HEART CATH AND CORONARY ANGIOGRAPHY;  Surgeon: Swaziland, Peter M, MD;  Location: [facility] INVASIVE CV LAB;  Service: Cardiovascular;  Laterality: N/A;   PERIPHERAL VASCULAR INTERVENTION  11/18/2021   Procedure: PERIPHERAL VASCULAR INTERVENTION;  Surgeon: Nada Libman, MD;  Location: [facility] INVASIVE CV LAB;  Service: Cardiovascular;;  right SFA   POLYPECTOMY  07/07/2018   Procedure: POLYPECTOMY;  Surgeon: Malissa Hippo, MD;  Location: [facility];  Service: Endoscopy;;  colon    ROTATOR CUFF REPAIR  right 1989   TUBAL LIGATION  1974     Home Medications:  Prior to Admission medications   Medication Sig Start Date End Date Taking? Authorizing Provider  aspirin EC 81 MG  tablet Take 81 mg by mouth in the morning. 07/17/18  Yes Rehman, Joline Maxcy, MD  clopidogrel (PLAVIX) 75 MG tablet Take 1 tablet (75 mg total) by mouth daily. 03/02/23  Yes Sharlene Dory, NP  esomeprazole (NEXIUM) 40 MG capsule Take 40 mg by mouth daily before breakfast. 11/07/16  Yes [provider]  ezetimibe (ZETIA) 10 MG tablet Take 10 mg by mouth in the morning.   Yes [provider]  gabapentin (NEURONTIN) 300 MG capsule Take 300 mg by mouth daily at 6 PM.   Yes [provider]  metoprolol succinate (TOPROL-XL) 50 MG 24 hr tablet Take 50 mg by mouth every evening.   Yes [provider]  nitroGLYCERIN (NITROSTAT) 0.4 MG SL tablet Place 1 tablet (0.4 mg total) under the tongue every 5 (five) minutes as needed for chest pain. 09/17/22  Yes Chandrasekhar, Mahesh A, MD  olmesartan (BENICAR) 20 MG tablet TAKE 1 TABLET BY MOUTH EVERY DAY 06/01/23  Yes Skains, Veverly Fells, MD  ROCKLATAN 0.02-0.005 % SOLN Place 1 drop into both eyes at bedtime. 12/09/20  Yes [provider]  rosuvastatin (  CRESTOR) 5 MG tablet Take 5 mg by mouth daily. In the morning   Yes [provider]    Inpatient Medications: Scheduled Meds:  aspirin  324 mg Oral Once   aspirin EC  81 mg Oral q AM   clopidogrel  75 mg Oral Daily   enoxaparin (LOVENOX) injection  40 mg Subcutaneous Q24H   ezetimibe  10 mg Oral q AM   gabapentin  300 mg Oral q1800   irbesartan  150 mg Oral Daily   isosorbide mononitrate  15 mg Oral Daily   lidocaine  1 patch Transdermal Q24H   metoprolol succinate  25 mg Oral QPM   pantoprazole  40 mg Oral Daily   [START ON 06/04/2023] rosuvastatin  10 mg Oral Daily   Continuous Infusions:  sodium chloride 40 mL/hr at 06/03/23 0534   PRN Meds: acetaminophen, melatonin, nitroGLYCERIN, polyethylene glycol, prochlorperazine  Allergies:    Allergies  Allergen Reactions   Clindamycin/Lincomycin Other (See Comments)    c-diff    Levaquin [Levofloxacin In D5w]  Itching    At IV site including red streaking   Lipitor [Atorvastatin] Other (See Comments)    myalgia   Morphine Other (See Comments)    REACTION: Patient states that her mother coded when given this medication therefore patient refuses to take   Quinolones Other (See Comments)    Hx of aortic aneurysm   Ceclor [Cefaclor] Rash   Cephalosporins Rash   Keflex [Cephalexin] Rash   Penicillins Hives and Rash    Social History:   Social History   Socioeconomic History   Marital status: Married    Spouse name: Not on file   Number of children: Not on file   Years of education: Not on file   Highest education level: Not on file  Occupational History   Occupation: former [employer] fundraiser  Tobacco Use   Smoking status: Former    Current packs/day: 0.00    Average packs/day: 1 pack/day for 16.0 years (16.0 ttl pk-yrs)    Types: Cigarettes    Start date: 06/29/1956    Quit date: 06/29/1972    Years since quitting: 50.9    Passive exposure: Never   Smokeless tobacco: Never  Vaping Use   Vaping status: Never Used  Substance and Sexual Activity   Alcohol use: No   Drug use: No   Sexual activity: Not on file  Other Topics Concern   Not on file  Social History Narrative   Not on file    Family History:    Family History  Problem Relation Age of Onset   Heart disease Father    Heart disease Mother      ROS:  Please see the history of present illness. All other ROS reviewed and negative.     Physical Exam/Data:   Vitals:   06/03/23 0130 06/03/23 0400 06/03/23 0430 06/03/23 0700  BP: 111/69 (!) 144/59 (!) 153/128 (!) 195/76  Pulse: 60 62 82   Resp: 15 14 (!) 32 19  Temp:      SpO2: 99% 96% 96%   Weight:      Height:       No intake or output data in the 24 hours ending 06/03/23 1001    06/02/2023    8:23 PM 05/03/2023    1:53 PM 03/02/2023    2:01 PM  Last 3 Weights  Weight (lbs) 116 lb 13.5 oz 117 lb 9.6 oz 115 lb 9.6 oz  Weight (kg) 53 kg 53.343  kg 52.436 kg     Body mass  index is 20.7 kg/m.  General:  Well nourished, well developed female appearing in no acute distress HEENT: normal Neck: no JVD Vascular: No carotid bruits; Distal pulses 2+ bilaterally Cardiac:  normal S1, S2; RRR; no murmur  Lungs:  clear to auscultation bilaterally, no wheezing, rhonchi or rales  Abd: soft, nontender, no hepatomegaly  Ext: no pitting edema Musculoskeletal:  No deformities, BUE and BLE strength normal and equal Skin: warm and dry  Neuro:  CNs 2-12 intact, no focal abnormalities noted Psych:  Normal affect   EKG:  The EKG was personally reviewed and demonstrates: Normal sinus rhythm, heart rate 79 with TWI actually improved along the anterior leads as compared to prior tracings from 01/2023. Telemetry:  Telemetry was personally reviewed and demonstrates: NSR, HR in 60's to 70's with occasional PVC's.   Relevant CV Studies:   Cardiac Catheterization: 01/2023  Prox LAD lesion is 90% stenosed.   2nd Mrg lesion is 85% stenosed.   A drug-eluting stent was successfully placed using a STENT ONYX FRONTIER 2.5X12.   Post intervention, there is a 0% residual stenosis.   The left ventricular systolic function is normal.   LV end diastolic pressure is normal.   The left ventricular ejection fraction is greater than 65% by visual estimate.   2 vessel obstructive CAD with 90% proximal LAD and 85% small caliber OM2 Normal LV function Normal LVEDP Successful PCI of the proximal LAD with DES    Plan: DAPT for one year. Anticipate DC in am. Given small vessel size and tortuosity I would treat the OM disease medically.     Echocardiogram: 01/2023 IMPRESSIONS    1. Limited echo   2. Left ventricular ejection fraction, by estimation, is 60 to 65%. The  left ventricle has normal function. The left ventricle has no regional  wall motion abnormalities. There is mild left ventricular hypertrophy.   3. Right ventricular systolic function is normal. The right ventricular  size is  normal. There is normal pulmonary artery systolic pressure. The  estimated right ventricular systolic pressure is 19.6 mmHg.   4. The mitral valve is grossly normal. Trivial mitral valve  regurgitation.   5. Aortic valve regurgitation is mild.   6. The inferior vena cava is normal in size with greater than 50%  respiratory variability, suggesting right atrial pressure of 3 mmHg.   7. Aortic dilatation noted. There is mild dilatation of the ascending  aorta, measuring 41 mm.   Comparison(s): Changes from prior study are noted. 10/07/2022: LVEF 55-60%,  dilated ascending aorta to 41 mm.   Laboratory Data:  High Sensitivity Troponin:   Recent Labs  Lab 06/02/23 2030 06/02/23 2231  TROPONINIHS 5 5     Chemistry Recent Labs  Lab 06/02/23 2030 06/03/23 0310  NA 137 138  K 3.5 3.8  CL 103 105  CO2 25 27  GLUCOSE 137* 122*  BUN 16 12  CREATININE 0.90 0.75  CALCIUM 9.4 9.5  MG  --  2.0  GFRNONAA >60 >60  ANIONGAP 9 6    Recent Labs  Lab 06/02/23 2030  PROT 6.3*  ALBUMIN 3.9  AST 20  ALT 13  ALKPHOS 98  BILITOT 0.5   Hematology Recent Labs  Lab 06/02/23 2030 06/03/23 0310  WBC 5.9 5.4  RBC 4.01 3.90  HGB 12.9 12.5  HCT 39.1 37.9  MCV 97.5 97.2  MCH 32.2 32.1  MCHC 33.0 33.0  RDW 13.1 13.0  PLT 253 228    Radiology/Studies:  CT Angio Chest Aorta W and/or Wo Contrast  Result Date: 06/03/2023 CLINICAL DATA:  Acute aortic syndrome suspected. Chest pain, shortness of breath, and nausea. EXAM: CT ANGIOGRAPHY CHEST WITH CONTRAST TECHNIQUE: Multidetector CT imaging of the chest was performed using the standard protocol during bolus administration of intravenous contrast. Multiplanar CT image reconstructions and MIPs were obtained to evaluate the vascular anatomy. RADIATION DOSE REDUCTION: This exam was performed according to the departmental dose-optimization program which includes automated exposure control, adjustment of the mA and/or kV according to patient size  and/or use of iterative reconstruction technique. CONTRAST:  75mL OMNIPAQUE IOHEXOL 350 MG/ML SOLN COMPARISON:  Chest radiograph 06/02/2023.  CT chest 05/10/2019 FINDINGS: Cardiovascular: Ascending aortic aneurysm measuring 4.1 cm diameter. No change since prior study. No aortic dissection. Great vessel origins are patent. Calcification of the aorta and coronary arteries. Cardiac enlargement. No pericardial effusions. Contrast opacification of the pulmonary arteries is not sufficient for evaluation of possibility of pulmonary embolus. Mediastinum/Nodes: Thyroid gland is unremarkable. Esophagus is decompressed. No significant lymphadenopathy. Lungs/Pleura: Emphysematous changes and scattered fibrosis in the lungs. Mild atelectasis in the lung bases. No airspace disease or consolidation. No pleural effusions. No pneumothorax. Upper Abdomen: No acute abnormality. Musculoskeletal: Degenerative changes in the spine. No acute bony abnormalities. Review of the MIP images confirms the above findings. IMPRESSION: 1. 4.1 cm diameter ascending aortic aneurysm. Recommend annual imaging followup by CTA or MRA. This recommendation follows 2010 ACCF/AHA/AATS/ACR/ASA/SCA/SCAI/SIR/STS/SVM Guidelines for the Diagnosis and Management of Patients with Thoracic Aortic Disease. Circulation. 2010; 121: Z610-R604. Aortic aneurysm NOS (ICD10-I71.9) 2. No aortic dissection or occlusion. Moderate aortic atherosclerosis. 3. Emphysematous changes in the lungs with scattered fibrosis. Atelectasis in the lung bases. No focal consolidation. 4. Cardiac enlargement. Electronically Signed   By: Burman Nieves M.D.   On: 06/03/2023 01:32   DG Chest Portable 1 View  Result Date: 06/02/2023 CLINICAL DATA:  Chest pain and discomfort. EXAM: PORTABLE CHEST 1 VIEW COMPARISON:  02/19/2023 FINDINGS: Shallow inspiration. Mild cardiac enlargement. No vascular congestion, edema, or consolidation. Probable emphysematous changes in the lungs. Central  interstitial changes likely chronic bronchitis. No change since prior study. Calcification of the aorta. Degenerative changes in the spine and shoulder. IMPRESSION: Probable emphysematous and chronic bronchitic changes in the lungs. Mild cardiac enlargement. No evidence of active pulmonary disease. Electronically Signed   By: Burman Nieves M.D.   On: 06/02/2023 22:20     Assessment and Plan:   1. Chest Pain with Atypical Features - Presented with chest pain which occurred after food consumption and pain lasted for 3 to 4 hours and then spontaneously resolved. No association with exertion or positional changes. Hs troponin values have been negative and her EKG actually shows improvement of her TWI along the anterior leads as compared to prior tracings. A limited echocardiogram is pending for reassessment of any structural abnormalities. - At this time, would anticipate medical management given recent cardiac catheterization and no evidence of ACS. She did have small vessel disease and we will plan to add Imdur 15 mg daily which can be further titrated as an outpatient. She reports previously being intolerant to Ranexa but did tolerate Imdur in the past. Continue ASA, Plavix, Zetia, Crestor and Toprol-XL.  2. CAD - Recent cardiac catheterization 01/2023 showed 2-vessel CAD with 90% proximal LAD treated with DES placement and 85% stenosis along small caliber 2nd Mrg with medical management recommended given small vessel size and tortuosity. - Obtaining a limited  echocardiogram as discussed above. Continue ASA 81 mg daily, Plavix 75 mg daily, Zetia 10 mg daily, Crestor 10 mg daily and Toprol-XL 25 mg daily. Will add Imdur 15 mg daily.  3. Palpitations - She does have a history of PAC's and PVC's. Remains on Toprol-XL 25 mg daily.  4. HLD - LDL was at 95 when checked in 04/2023 and it was recommended to increase Crestor to 10 mg daily. Will make this adjustment in her medications at this time as  currently ordered as 5mg  daily. Continue Zetia 10 mg daily. If she develops myalgias with titration of statin therapy, would need referral to the [facility] as an outpatient.  5. Ascending Aortic Aneurysm  - Stable at 4.1 cm by repeat imaging this admission. Continue to follow as an outpatient.     For questions or updates, please contact [facility] Please consult [email or web address] for contact info under    Signed, Ellsworth Lennox, PA-C  06/03/2023 10:01 AM   Attending note:  Patient seen and examined.  I reviewed her records and discussed the case with Heidi Wallace, I agree with her above findings.  Heidi Wallace presents with known history of CAD as outlined above including DES to the LAD in August with otherwise medical management of an 85% stenosis in small segment of the obtuse marginal system.  She describes onset of chest discomfort on the evening of presentation after eating, took nitroglycerin without resolution of symptoms and came to the ED for further assessment.  She was subsequently managed with Protonix, fentanyl, and Maalox/Mylanta, not entirely clear that any of these interventions made her symptoms resolve, her chest discomfort was eventually gone within 3 to 4 hours of onset.  Cardiac enzymes are not indicative of ACS, she did have a chest CTA obtained which shows 4.1 cm dilatation of the ascending thoracic aorta without acute findings.  ECG shows no acute changes.  On examination this morning she appears comfortable, no active symptoms.  Afebrile and otherwise hemodynamically stable.  Lungs are clear.  Cardiac exam with RRR and soft systolic murmur, no pericardial rub.  Pertinent lab work includes potassium 3.8, creatinine 0.75, high-sensitivity troponin I levels of 5, hemoglobin 12.5.  ECG shows sinus rhythm without acute ST segment changes in comparison with the prior tracing.  Symptoms, history, and interval testing reviewed with the patient.  Limited  echocardiogram done today shows LVEF 60 to 65% without regional wall motion abnormalities.  At this point recommend continuing medical therapy from a cardiac perspective, she is on aspirin, Plavix, Zetia, Crestor, and Toprol-XL.  Start Imdur 15 mg daily and uptitrate as tolerated with close outpatient follow-up.  Jonelle Sidle, M.D., F.A.C.C.

## 2023-06-03 NOTE — Evaluation (Signed)
Physical Therapy Evaluation Patient Details Name: Heidi Wallace MRN: [id number] DOB: 10-Sep-1935 Today's Date: 06/03/2023  History of Present Illness  Heidi Wallace is a 87 y.o. female with medical history significant for coronary artery disease status post NSTEMI, hyperlipidemia, hypertension, PVCs/PACs, GERD, ascending aortic aneurysm measuring 4.1 cm, peripheral artery disease, chronic HFpEF, who presented to the ED from home due to chest pain similar to previous that led to DES stent placement in proximal LAD on 02/19/2023.  Her last visit to her cardiologist was on 05/03/2023.  Also endorses moderate right scapular pain x 1 day.  No reported fevers or chills. (per DO)   Clinical Impression  Patient functioning near baseline for functional mobility and gait demonstrating good return for bed mobility, transfers and walking in room/hallways without loss of balance.  Plan:  Patient discharged from physical therapy to care of nursing for ambulation daily as tolerated for length of stay.          If plan is discharge home, recommend the following: A little help with walking and/or transfers;A little help with bathing/dressing/bathroom;Assistance with cooking/housework;Help with stairs or ramp for entrance   Can travel by private vehicle        Equipment Recommendations None recommended by PT  Recommendations for Other Services       Functional Status Assessment Patient has had a recent decline in their functional status and/or demonstrates limited ability to make significant improvements in function in a reasonable and predictable amount of time     Precautions / Restrictions Precautions Precautions: Fall Restrictions Weight Bearing Restrictions: No      Mobility  Bed Mobility Overal bed mobility: Independent                  Transfers Overall transfer level: Independent                      Ambulation/Gait                  Stairs             Wheelchair Mobility     Tilt Bed    Modified Rankin (Stroke Patients Only)       Balance Overall balance assessment: Mild deficits observed, not formally tested                                           Pertinent Vitals/Pain Pain Assessment Pain Assessment: No/denies pain    Home Living Family/patient expects to be discharged to:: Private residence Living Arrangements: Spouse/significant other Available Help at Discharge: Family;Available PRN/intermittently Type of Home: House Home Access: Stairs to enter Entrance Stairs-Rails: Right Entrance Stairs-Number of Steps: 2   Home Layout: One level Home Equipment: Shower seat Additional Comments: takes care of husband that has dementia. Has people who are hired to help with cleaning and yard care.    Prior Function Prior Level of Function : Independent/Modified Independent                     Extremity/Trunk Assessment   Upper Extremity Assessment Upper Extremity Assessment: Defer to OT evaluation    Lower Extremity Assessment Lower Extremity Assessment: Overall WFL for tasks assessed    Cervical / Trunk Assessment Cervical / Trunk Assessment: Normal  Communication   Communication Communication: No apparent difficulties  Cognition Arousal: Alert Behavior During Therapy:  WFL for tasks assessed/performed Overall Cognitive Status: Within Functional Limits for tasks assessed                                          General Comments      Exercises     Assessment/Plan    PT Assessment All further PT needs can be met in the next venue of care  PT Problem List Decreased strength;Decreased activity tolerance;Decreased balance;Decreased mobility       PT Treatment Interventions      PT Goals (Current goals can be found in the Care Plan section)  Acute Rehab PT Goals Patient Stated Goal: return with family to assist PT Goal Formulation: With patient Time For Goal  Achievement: 06/03/23 Potential to Achieve Goals: Good    Frequency       Co-evaluation PT/OT/SLP Co-Evaluation/Treatment: Yes Reason for Co-Treatment: To address functional/ADL transfers PT goals addressed during session: Mobility/safety with mobility;Balance OT goals addressed during session: ADL's and self-care       AM-PAC PT "6 Clicks" Mobility  Outcome Measure Help needed turning from your back to your side while in a flat bed without using bedrails?: None Help needed moving from lying on your back to sitting on the side of a flat bed without using bedrails?: None Help needed moving to and from a bed to a chair (including a wheelchair)?: None Help needed standing up from a chair using your arms (e.g., wheelchair or bedside chair)?: None Help needed to walk in hospital room?: A Little Help needed climbing 3-5 steps with a railing? : A Little 6 Click Score: 22    End of Session   Activity Tolerance: Patient tolerated treatment well;Patient limited by fatigue Patient left: in bed;with call bell/phone within reach Nurse Communication: Mobility status PT Visit Diagnosis: Unsteadiness on feet (R26.81);Other abnormalities of gait and mobility (R26.89);Muscle weakness (generalized) (M62.81)    Time: 0981-1914 PT Time Calculation (min) (ACUTE ONLY): 12 min   Charges:   PT Evaluation $PT Eval Low Complexity: 1 Low PT Treatments $Therapeutic Activity: 8-22 mins PT General Charges $$ ACUTE PT VISIT: 1 Visit         12:24 PM, 06/03/23 Ocie Bob, MPT Physical Therapist with [facility] 336 [phone] office [phone] mobile phone

## 2023-06-03 NOTE — Progress Notes (Signed)
*  PRELIMINARY RESULTS* Echocardiogram Limited 2-D Echocardiogram  has been performed.  Stacey Drain 06/03/2023, 10:18 AM

## 2023-06-04 ENCOUNTER — Telehealth: Payer: Self-pay | Admitting: *Deleted

## 2023-06-04 DIAGNOSIS — E785 Hyperlipidemia, unspecified: Secondary | ICD-10-CM

## 2023-06-04 DIAGNOSIS — Z79899 Other long term (current) drug therapy: Secondary | ICD-10-CM

## 2023-06-04 DIAGNOSIS — I739 Peripheral vascular disease, unspecified: Secondary | ICD-10-CM

## 2023-06-04 MED ORDER — ROSUVASTATIN CALCIUM 10 MG PO TABS: 10.0000 mg | ORAL_TABLET | Freq: Every day | ORAL | 3 refills | Status: DC

## 2023-06-04 NOTE — Telephone Encounter (Signed)
Patient informed and verbalized understanding of plan. Will get lab orders at 07/02/2023 visit PCP copied

## 2023-06-04 NOTE — Telephone Encounter (Signed)
-----   Message from [facility] sent at 06/02/2023  5:25 PM EST ----- LDL is not quite at goal.  Please increase rosuvastatin to 10 mg daily.  Lets recheck FLP/LFT in 6 to 8 weeks per protocol.  Diagnosis for this can be associated with peripheral arterial disease.  Rest of lab work is stable.  Sharlene Dory, AGNP-C

## 2023-06-09 DIAGNOSIS — R7301 Impaired fasting glucose: Secondary | ICD-10-CM | POA: Diagnosis not present

## 2023-06-09 DIAGNOSIS — R945 Abnormal results of liver function studies: Secondary | ICD-10-CM | POA: Diagnosis not present

## 2023-06-09 DIAGNOSIS — M720 Palmar fascial fibromatosis [Dupuytren]: Secondary | ICD-10-CM | POA: Diagnosis not present

## 2023-06-09 DIAGNOSIS — I10 Essential (primary) hypertension: Secondary | ICD-10-CM | POA: Diagnosis not present

## 2023-06-09 DIAGNOSIS — E559 Vitamin D deficiency, unspecified: Secondary | ICD-10-CM | POA: Diagnosis not present

## 2023-06-09 DIAGNOSIS — E7801 Familial hypercholesterolemia: Secondary | ICD-10-CM | POA: Diagnosis not present

## 2023-06-09 DIAGNOSIS — I259 Chronic ischemic heart disease, unspecified: Secondary | ICD-10-CM | POA: Diagnosis not present

## 2023-06-09 DIAGNOSIS — F332 Major depressive disorder, recurrent severe without psychotic features: Secondary | ICD-10-CM | POA: Diagnosis not present

## 2023-06-09 DIAGNOSIS — R079 Chest pain, unspecified: Secondary | ICD-10-CM | POA: Diagnosis not present

## 2023-06-11 DIAGNOSIS — R059 Cough, unspecified: Secondary | ICD-10-CM | POA: Diagnosis not present

## 2023-06-11 DIAGNOSIS — Z6820 Body mass index (BMI) 20.0-20.9, adult: Secondary | ICD-10-CM | POA: Diagnosis not present

## 2023-06-11 DIAGNOSIS — Z20828 Contact with and (suspected) exposure to other viral communicable diseases: Secondary | ICD-10-CM | POA: Diagnosis not present

## 2023-06-11 DIAGNOSIS — R030 Elevated blood-pressure reading, without diagnosis of hypertension: Secondary | ICD-10-CM | POA: Diagnosis not present

## 2023-06-14 ENCOUNTER — Telehealth: Payer: Self-pay | Admitting: *Deleted

## 2023-06-14 MED ORDER — ISOSORBIDE MONONITRATE ER 30 MG PO TB24: 30.0000 mg | ORAL_TABLET | Freq: Every day | ORAL | 3 refills | Status: DC

## 2023-06-14 NOTE — Telephone Encounter (Signed)
Pt has been having recurrent chest pain and has been to ED for evaluation.  Though ED notes reviewed by Dr Anne Fu who gives orders for pt to increase Isosorbide to 30 mg daily and discontinue olmesartan for the prevention hypotension.  Pt will continue to monitor her blood pressure and let us know if any further chest pain.

## 2023-06-16 ENCOUNTER — Other Ambulatory Visit: Payer: Self-pay | Admitting: *Deleted

## 2023-06-16 MED ORDER — ISOSORBIDE MONONITRATE ER 30 MG PO TB24: 30.0000 mg | ORAL_TABLET | Freq: Every day | ORAL | 3 refills | Status: DC

## 2023-07-02 ENCOUNTER — Ambulatory Visit: Payer: Medicare PPO | Attending: Nurse Practitioner | Admitting: Nurse Practitioner

## 2023-07-02 ENCOUNTER — Encounter: Payer: Self-pay | Admitting: Nurse Practitioner

## 2023-07-02 VITALS — BP 124/72 | HR 76 | Wt 115.6 lb

## 2023-07-02 DIAGNOSIS — I7121 Aneurysm of the ascending aorta, without rupture: Secondary | ICD-10-CM

## 2023-07-02 DIAGNOSIS — I25119 Atherosclerotic heart disease of native coronary artery with unspecified angina pectoris: Secondary | ICD-10-CM

## 2023-07-02 DIAGNOSIS — I739 Peripheral vascular disease, unspecified: Secondary | ICD-10-CM | POA: Diagnosis not present

## 2023-07-02 DIAGNOSIS — E785 Hyperlipidemia, unspecified: Secondary | ICD-10-CM

## 2023-07-02 DIAGNOSIS — I10 Essential (primary) hypertension: Secondary | ICD-10-CM | POA: Diagnosis not present

## 2023-07-02 DIAGNOSIS — Z79899 Other long term (current) drug therapy: Secondary | ICD-10-CM

## 2023-07-02 NOTE — Patient Instructions (Addendum)
 Medication Instructions:  Your physician recommends that you continue on your current medications as directed. Please refer to the Current Medication list given to you today.  Labwork: In 2-3 weeks   Testing/Procedures: None   Follow-Up: Your physician recommends that you schedule a follow-up appointment in: as scheduled with skains   Any Other Special Instructions Will Be Listed Below (If Applicable).  If you need a refill on your cardiac medications before your next appointment, please call your pharmacy.

## 2023-07-02 NOTE — Progress Notes (Signed)
 Cardiology Office Note:  .   Date: 07/02/2023 ID:  [place] Heidi Wallace, DOB 1936-04-13, MRN [id number] PCP: Atilano Deward ORN, MD  [facility] Providers Cardiologist:  Oneil Parchment, MD    History of Present Illness: SABRA   Heidi Wallace is a 88 y.o. female with a PMH of CAD, s/p NSTEMI, HLD, HTN, PVC's/PAC's, GERD, ascending aortic aneurysm, and PAD (hx of right popliteal stent), who presents today for follow-up.  TTE 09/2022 revealed EF 55 to 60%, grade 1 DD, mild AR, mild dilatation of ascending aorta measuring 41 mm.  Transferred from [facility] to [facility] and underwent cardiac cath due to having chest pain and mild trop elevation 01/2023. Cath on 02/19/23 revealed 90% proximal LAD stenosis, received DES.  OM 2 lesion was at 85% stenosis but due to being small vessel, was recommended to be medically managed. Limited Echo showed EF 60-65%, no RWMA, mild LVH, mild dilatation of ascending aorta at 41 mm, stable from 09/2022.   I last saw her for hospital follow-up on 03/02/2023. She had noticed shortness of breath since her procedure after she started receiving Brilinta . Had noticed leg weakness ever since her procedure as well. Denied any chest pain, palpitations, syncope, presyncope, dizziness, orthopnea, PND, swelling or significant weight changes, acute bleeding, or claudication. Was switched from Brilinta  to Plavix .   05/03/2023 - Today she presents for follow-up. Breathing has improved and denies any recent shortness of breath. Now on Plavix  and does admit to some minimal bruising. Denies any chest pain,  palpitations, syncope, presyncope, dizziness, orthopnea, PND, swelling or significant weight changes, acute bleeding, or claudication. She is thinking about doing some physical therapy to improve her balance and get this arranged with her PCP.   ED visit on 06/02/2023  for acute chest pain. Initial trop was normal, it was felt to be possible esophageal etiology with her hx of GERD. CT angio arranged and  revealed 4/1 cm diameter ascending aortic aneurysm, nothing acute with emphysematous changes in lungs with scattered fibrosis and atelectasis in the lung bases. See full report below.   Today she presents for follow-up. Admits to right sided chest pain at times that goes to her right shoulder blade, believes this pain to be arthritic as tylenol  helps some pain some. She has recently had her Imdur  dosage increased and has stopped taking Olmesartan  at the advice of Dr. Parchment - she says increasing the dose of Imdur  has helped some. Also tells me she is taking Crestor  5 mg daily instead of 10 mg daily. Could not tolerate 10 mg daily as this was causing her pain. Denies any shortness of breath, palpitations, syncope, presyncope, dizziness, orthopnea, PND, swelling or significant weight changes, acute bleeding, or claudication.   SH: Patient is a former [employer]. Daughter in social worker is a [employer] at [employer] at [employer].   Studies Reviewed: .    CT angio chest 06/03/2023:  IMPRESSION: 1. 4.1 cm diameter ascending aortic aneurysm. Recommend annual imaging followup by CTA or MRA. This recommendation follows 2010 ACCF/AHA/AATS/ACR/ASA/SCA/SCAI/SIR/STS/SVM Guidelines for the Diagnosis and Management of Patients with Thoracic Aortic Disease. Circulation. 2010; 121: Z733-z630. Aortic aneurysm NOS (ICD10-I71.9) 2. No aortic dissection or occlusion. Moderate aortic atherosclerosis. 3. Emphysematous changes in the lungs with scattered fibrosis. Atelectasis in the lung bases. No focal consolidation. 4. Cardiac enlargement.  Limited Echo 06/03/2023:  1. Limited study.   2. Left ventricular ejection fraction, by estimation, is 60 to 65%. The  left ventricle has normal function. The left ventricle  has no regional  wall motion abnormalities. There is mild concentric left ventricular  hypertrophy.   3. Right ventricular systolic function is normal. The right ventricular  size is normal.   4. The mitral valve is  degenerative. Trivial mitral valve regurgitation.   5. The aortic valve is tricuspid. There is mild calcification of the  aortic valve. Aortic valve regurgitation is mild. Aortic valve sclerosis  is present, with no evidence of aortic valve stenosis.   6. The inferior vena cava is normal in size with greater than 50%  respiratory variability, suggesting right atrial pressure of 3 mmHg.   Comparison(s): Prior images reviewed side by side. LVEF remains normal  range at 60-65%.  Echo limited 02/20/2023:   1. Limited echo   2. Left ventricular ejection fraction, by estimation, is 60 to 65%. The  left ventricle has normal function. The left ventricle has no regional  wall motion abnormalities. There is mild left ventricular hypertrophy.   3. Right ventricular systolic function is normal. The right ventricular  size is normal. There is normal pulmonary artery systolic pressure. The  estimated right ventricular systolic pressure is 19.6 mmHg.   4. The mitral valve is grossly normal. Trivial mitral valve  regurgitation.   5. Aortic valve regurgitation is mild.   6. The inferior vena cava is normal in size with greater than 50%  respiratory variability, suggesting right atrial pressure of 3 mmHg.   7. Aortic dilatation noted. There is mild dilatation of the ascending  aorta, measuring 41 mm.   Comparison(s): Changes from prior study are noted. 10/07/2022: LVEF 55-60%,  dilated ascending aorta to 41 mm.  LHC 02/19/2023:    Prox LAD lesion is 90% stenosed.   2nd Mrg lesion is 85% stenosed.   A drug-eluting stent was successfully placed using a STENT ONYX FRONTIER 2.5X12.   Post intervention, there is a 0% residual stenosis.   The left ventricular systolic function is normal.   LV end diastolic pressure is normal.   The left ventricular ejection fraction is greater than 65% by visual estimate.   2 vessel obstructive CAD with 90% proximal LAD and 85% small caliber OM2 Normal LV  function Normal LVEDP Successful PCI of the proximal LAD with DES    Plan: DAPT for one year. Anticipate DC in am. Given small vessel size and tortuosity I would treat the OM disease medically.    Echo complete 10/07/2022:   1. Left ventricular ejection fraction, by estimation, is 55 to 60%. The  left ventricle has normal function. The left ventricle has no regional  wall motion abnormalities. There is mild left ventricular hypertrophy.  Left ventricular diastolic parameters  are consistent with Grade I diastolic dysfunction (impaired relaxation).   2. Right ventricular systolic function is normal. The right ventricular  size is normal. There is normal pulmonary artery systolic pressure.   3. The mitral valve is normal in structure. Trivial mitral valve  regurgitation. No evidence of mitral stenosis.   4. The aortic valve is tricuspid. Aortic valve regurgitation is mild.  Aortic valve sclerosis is present, with no evidence of aortic valve  stenosis.   5. Aortic dilatation noted. There is mild dilatation of the ascending  aorta, measuring 41 mm.   6. The inferior vena cava is normal in size with greater than 50%  respiratory variability, suggesting right atrial pressure of 3 mmHg.  NST 09/23/2022:    The study is normal. The study is low risk.   No ST  deviation was noted.   LV perfusion is normal. There is no evidence of ischemia. There is no evidence of infarction.   Left ventricular function is normal. Nuclear stress EF: 56 %. The left ventricular ejection fraction is normal (55-65%). End diastolic cavity size is normal. End systolic cavity size is normal.  ABI 08/2022:  Summary:  Right: Resting right ankle-brachial index is within normal range. The  right toe-brachial index is abnormal.   Left: Resting left ankle-brachial index is within normal range. The left  toe-brachial index is normal.  Arterial duplex 08/2022:  Right: patent stent with no stenosis.   Physical Exam:   VS:   BP 124/72   Pulse 76   Wt 115 lb 9.6 oz (52.4 kg)   SpO2 97%   BMI 20.48 kg/m    Wt Readings from Last 3 Encounters:  07/02/23 115 lb 9.6 oz (52.4 kg)  06/02/23 116 lb 13.5 oz (53 kg)  05/03/23 117 lb 9.6 oz (53.3 kg)    GEN: Well nourished, well developed in no acute distress NECK: No JVD; No carotid bruits CARDIAC: S1/S2, RRR, no murmurs, rubs, gallops RESPIRATORY:  Clear to auscultation without rales, wheezing or rhonchi  ABDOMEN: Soft, non-tender, non-distended EXTREMITIES:  No edema; No deformity   ASSESSMENT AND PLAN: .    CAD, s/p NSTEMI LHC 01/2023 showed 2 vessel obstructive CAD with successful PCI/DES to pLAD. OM 2 lesion was at 85% stenosis but due to being small vessel, was recommended to be medically managed. Admits to some recent atypical chest pain. Continue Aspirin , Plavix , Zetia , Toprol  XL, Rosuvastatin , Olmesartan , and NTG. Recommended to increase Imdur  to help symptoms, but pt declines at this time. Will continue to monitor. Care and ED precautions discussed. Heart healthy diet and regular cardiovascular exercise encouraged. Because she is no longer taking Crestor  10 mg daily as this was causing pain, will check a FLP/LFT in 2 weeks at [facility].   HLD, medication management LDL 02/20/2023 was 88. LDL goal with PCI < 70. Will obtain FLP/LFT in 1-2 weeks. Continue Zetia  and Rosuvastatin . Heart healthy diet and regular cardiovascular exercise encouraged.   HTN BP well controlled. Continue current medication regimen. Discussed to monitor BP at home at least 2 hours after medications and sitting for 5-10 minutes. Heart healthy diet and regular cardiovascular exercise encouraged.   PAD  Hx of right popliteal stent. ABI's 08/2022 normal with arterial duplex at time showing patent right popliteal stent. Denies any claudication symptoms. Encouraged increasing ambulation as much as tolerated. Continue to follow with PCP and [facility].   Ascending aortic aneurysm CTA angio 05/2023  revealed 4.1 cm ascending aortic aneurysm, annual imaging recommended. Plan to update 05/2024.  No medication changes at this time. Care and ED precautions discussed.     Dispo: Follow-up with MD as scheduled or with APP sooner if anything changes.   Signed, Almarie Crate, NP

## 2023-07-13 DIAGNOSIS — H401132 Primary open-angle glaucoma, bilateral, moderate stage: Secondary | ICD-10-CM | POA: Diagnosis not present

## 2023-07-16 DIAGNOSIS — E782 Mixed hyperlipidemia: Secondary | ICD-10-CM | POA: Diagnosis not present

## 2023-07-16 DIAGNOSIS — R945 Abnormal results of liver function studies: Secondary | ICD-10-CM | POA: Diagnosis not present

## 2023-07-16 DIAGNOSIS — N1831 Chronic kidney disease, stage 3a: Secondary | ICD-10-CM | POA: Diagnosis not present

## 2023-07-27 ENCOUNTER — Other Ambulatory Visit: Payer: Self-pay | Admitting: Nurse Practitioner

## 2023-07-27 DIAGNOSIS — Z789 Other specified health status: Secondary | ICD-10-CM

## 2023-08-27 DIAGNOSIS — I2510 Atherosclerotic heart disease of native coronary artery without angina pectoris: Secondary | ICD-10-CM | POA: Diagnosis not present

## 2023-08-27 DIAGNOSIS — E782 Mixed hyperlipidemia: Secondary | ICD-10-CM | POA: Diagnosis not present

## 2023-08-27 DIAGNOSIS — I10 Essential (primary) hypertension: Secondary | ICD-10-CM | POA: Diagnosis not present

## 2023-09-13 ENCOUNTER — Telehealth: Payer: Self-pay | Admitting: *Deleted

## 2023-09-13 ENCOUNTER — Ambulatory Visit: Payer: Medicare PPO

## 2023-09-13 NOTE — Telephone Encounter (Signed)
 Pt is not longer able to travel to the [place] office for her care and needs to transfer to [place] office.  She lives in [place].  Would like to transfer from Dr Anne Fu to Dr Diona Browner. We send to both providers for approval.

## 2023-09-13 NOTE — Progress Notes (Deleted)
 Patient ID: MAZEY Wallace                 DOB: December 09, 1935                    MRN: [id number]      HPI: Heidi Wallace is a 88 y.o. female patient referred to lipid clinic by Sharlene Dory, NP. PMH is significant for hypertension, CAD s/p NSTEMI, PAD(hx of right popliteal stent) ,HLD,PVC's/PAC's, GERD, and ascending aortic aneurysm .   Cath on 02/19/23 revealed 90% proximal LAD stenosis, received DES. OM 2 lesion was at 85% stenosis but due to being small vessel, was recommended to be medically managed. At the last visit with NP pt reported she can not tolerate Crestor 10 mg so has been taking 5 mg instead. Patient recent lab (07/16/2023) reveals LDLc level on 104 and TC 208. AST and ALT WNL.   Today patient presented for lipid clinic.  Reviewed options for lowering LDL cholesterol, including ezetimibe, PCSK-9 inhibitors, bempedoic acid and inclisiran.  Discussed mechanisms of action, dosing, side effects and potential decreases in LDL cholesterol.  Also reviewed cost information and potential options for patient assistance.  Current Medications: Zetia 10 mg daily  and Crestor 5 mg daily  Intolerances: Crestor 10 mg daily - myalgia  Risk Factors: hypertension, CAD s/p NSTEMI, PAD(hx of right popliteal stent) ,HLD LDL goal: <70   Diet:   Exercise:   Family History:   Social History:   Labs:  Lipid Panel     Component Value Date/Time   CHOL 170 02/20/2023 0350   TRIG 62 02/20/2023 0350   HDL 70 02/20/2023 0350   CHOLHDL 2.4 02/20/2023 0350   VLDL 12 02/20/2023 0350   LDLCALC 88 02/20/2023 0350    Past Medical History:  Diagnosis Date   Chest pain    Chronic kidney disease    Coronary artery disease    a.  cath in 01/2023 in the setting of unstable angina showing 2-vessel CAD with 90% proximal LAD treated with DES placement and 85% stenosis along small caliber 2nd Mrg with medical management recommended given small vessel size and tortuosity   GERD (gastroesophageal reflux  disease)    Headache    History of nuclear stress test 2017   low risk study   Hyperlipidemia    Hypertension    Post concussion syndrome    PVC's (premature ventricular contractions)    PVCs and ventricular bigeminy with abnormal nuclear test    Current Outpatient Medications on File Prior to Visit  Medication Sig Dispense Refill   aspirin EC 81 MG tablet Take 81 mg by mouth in the morning.     clopidogrel (PLAVIX) 75 MG tablet Take 1 tablet (75 mg total) by mouth daily. 90 tablet 3   esomeprazole (NEXIUM) 40 MG capsule Take 40 mg by mouth daily before breakfast.  3   ezetimibe (ZETIA) 10 MG tablet Take 10 mg by mouth in the morning.     gabapentin (NEURONTIN) 300 MG capsule Take 300 mg by mouth daily at 6 PM.     isosorbide mononitrate (IMDUR) 30 MG 24 hr tablet Take 1 tablet (30 mg total) by mouth daily. 90 tablet 3   metoprolol succinate (TOPROL-XL) 50 MG 24 hr tablet Take 50 mg by mouth every evening.     nitroGLYCERIN (NITROSTAT) 0.4 MG SL tablet Place 1 tablet (0.4 mg total) under the tongue every 5 (five) minutes as needed for chest pain. 25  tablet 3   ROCKLATAN 0.02-0.005 % SOLN Place 1 drop into both eyes at bedtime.     rosuvastatin (CRESTOR) 10 MG tablet Take 1 tablet (10 mg total) by mouth daily. (Patient taking differently: Take 5 mg by mouth daily.) 90 tablet 3   No current facility-administered medications on file prior to visit.    Allergies  Allergen Reactions   Clindamycin/Lincomycin Other (See Comments)    c-diff    Levaquin [Levofloxacin In D5w] Itching    At IV site including red streaking   Lipitor [Atorvastatin] Other (See Comments)    myalgia   Morphine Other (See Comments)    REACTION: Patient states that her mother coded when given this medication therefore patient refuses to take   Quinolones Other (See Comments)    Hx of aortic aneurysm   Ceclor [Cefaclor] Rash   Cephalosporins Rash   Keflex [Cephalexin] Rash   Penicillins Hives and Rash     Assessment/Plan:  1. Hyperlipidemia -  No problems updated. No problem-specific Assessment & Plan notes found for this encounter.    Thank you,  Carmela Hurt, Pharm.D [facility] of [facility] [address] [place], [place] [address]  Phone: [phone]; Fax: [phone]

## 2023-09-14 ENCOUNTER — Ambulatory Visit: Payer: Medicare PPO | Admitting: Cardiology

## 2023-09-21 ENCOUNTER — Other Ambulatory Visit: Payer: Self-pay | Admitting: Internal Medicine

## 2023-09-22 NOTE — Telephone Encounter (Signed)
This is a Nurse, mental health pt

## 2023-09-27 DIAGNOSIS — E782 Mixed hyperlipidemia: Secondary | ICD-10-CM | POA: Diagnosis not present

## 2023-09-27 DIAGNOSIS — I2510 Atherosclerotic heart disease of native coronary artery without angina pectoris: Secondary | ICD-10-CM | POA: Diagnosis not present

## 2023-09-27 DIAGNOSIS — I10 Essential (primary) hypertension: Secondary | ICD-10-CM | POA: Diagnosis not present

## 2023-10-06 DIAGNOSIS — R7301 Impaired fasting glucose: Secondary | ICD-10-CM | POA: Diagnosis not present

## 2023-10-06 DIAGNOSIS — E7849 Other hyperlipidemia: Secondary | ICD-10-CM | POA: Diagnosis not present

## 2023-10-06 DIAGNOSIS — E7801 Familial hypercholesterolemia: Secondary | ICD-10-CM | POA: Diagnosis not present

## 2023-10-06 DIAGNOSIS — Z1329 Encounter for screening for other suspected endocrine disorder: Secondary | ICD-10-CM | POA: Diagnosis not present

## 2023-10-06 DIAGNOSIS — N189 Chronic kidney disease, unspecified: Secondary | ICD-10-CM | POA: Diagnosis not present

## 2023-10-06 DIAGNOSIS — N1831 Chronic kidney disease, stage 3a: Secondary | ICD-10-CM | POA: Diagnosis not present

## 2023-10-06 DIAGNOSIS — E559 Vitamin D deficiency, unspecified: Secondary | ICD-10-CM | POA: Diagnosis not present

## 2023-10-26 ENCOUNTER — Encounter: Payer: Self-pay | Admitting: Cardiology

## 2023-10-26 ENCOUNTER — Ambulatory Visit: Attending: Cardiology | Admitting: Cardiology

## 2023-10-26 ENCOUNTER — Telehealth: Payer: Self-pay | Admitting: Cardiology

## 2023-10-26 ENCOUNTER — Other Ambulatory Visit: Payer: Self-pay | Admitting: *Deleted

## 2023-10-26 VITALS — BP 130/82 | HR 80 | Ht 63.0 in | Wt 112.0 lb

## 2023-10-26 DIAGNOSIS — I25119 Atherosclerotic heart disease of native coronary artery with unspecified angina pectoris: Secondary | ICD-10-CM

## 2023-10-26 DIAGNOSIS — I739 Peripheral vascular disease, unspecified: Secondary | ICD-10-CM

## 2023-10-26 DIAGNOSIS — N1831 Chronic kidney disease, stage 3a: Secondary | ICD-10-CM | POA: Diagnosis not present

## 2023-10-26 DIAGNOSIS — M791 Myalgia, unspecified site: Secondary | ICD-10-CM

## 2023-10-26 DIAGNOSIS — T466X5D Adverse effect of antihyperlipidemic and antiarteriosclerotic drugs, subsequent encounter: Secondary | ICD-10-CM | POA: Diagnosis not present

## 2023-10-26 DIAGNOSIS — Z789 Other specified health status: Secondary | ICD-10-CM

## 2023-10-26 DIAGNOSIS — I2510 Atherosclerotic heart disease of native coronary artery without angina pectoris: Secondary | ICD-10-CM | POA: Diagnosis not present

## 2023-10-26 DIAGNOSIS — E7841 Elevated Lipoprotein(a): Secondary | ICD-10-CM

## 2023-10-26 DIAGNOSIS — E782 Mixed hyperlipidemia: Secondary | ICD-10-CM

## 2023-10-26 DIAGNOSIS — Z681 Body mass index (BMI) 19 or less, adult: Secondary | ICD-10-CM | POA: Diagnosis not present

## 2023-10-26 DIAGNOSIS — Z79899 Other long term (current) drug therapy: Secondary | ICD-10-CM

## 2023-10-26 DIAGNOSIS — F411 Generalized anxiety disorder: Secondary | ICD-10-CM | POA: Diagnosis not present

## 2023-10-26 DIAGNOSIS — I10 Essential (primary) hypertension: Secondary | ICD-10-CM | POA: Diagnosis not present

## 2023-10-26 DIAGNOSIS — I77819 Aortic ectasia, unspecified site: Secondary | ICD-10-CM

## 2023-10-26 NOTE — Progress Notes (Signed)
 Cardiology Office Note  Date: 10/26/2023   ID: ANTONELLA CERRA, DOB [birth year], MRN [id number]  History of Present Illness: Heidi Wallace is an 88 y.o. female prior patient of Dr. Renna Cary now presenting to establish follow-up with me.  Her last visit was with Ms. Clementine Cutting NP in January, I reviewed her note.  She is here for a routine visit.  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  She reports NYHA class II dyspnea, no palpitations or syncope.  Has had leg weakness, no sores or ulcerations distally.  She has follow-up scheduled with [facility] next month for ABIs.  We went over her medications.  Plavix  is scheduled to continue through August of this year.  She does not report any spontaneous bleeding problems.  She has a history of statin myalgias, on Crestor  5 mg daily which is her maximum tolerated dose, also Zetia  10 mg daily.  On this regimen her LDL was 104 with HDL 91 in January.  She had previously been referred for a lipid clinic evaluation for PCSK9 inhibitors, however had to cancel the visit.  LP(a) also elevated.  She had a chest CTA obtained in December 2024 with incidental documentation of mildly dilated ascending aorta at 41 mm.  Plan to reimage in December of this year.  She is asymptomatic from this perspective.  I reviewed her cardiac testing from August 2024, presented with unstable angina, cardiac enzymes were not technically diagnostic of NSTEMI.  She underwent DES to the proximal LAD at that time with medical management of a small caliber OM 2 stenosis.  Physical Exam: VS:  BP 130/82 (BP Location: Left Arm)   Pulse 80   Ht 5\' 3"  (1.6 m)   Wt 112 lb (50.8 kg)   SpO2 98%   BMI 19.84 kg/m , BMI Body mass index is 19.84 kg/m.  Wt Readings from Last 3 Encounters:  10/26/23 112 lb (50.8 kg)  07/02/23 115 lb 9.6 oz (52.4 kg)  06/02/23 116 lb 13.5 oz (53 kg)    General: Patient appears  comfortable at rest. HEENT: Conjunctiva and lids normal. Neck: Supple, no elevated JVP or carotid bruits. Lungs: Clear to auscultation, nonlabored breathing at rest. Cardiac: Regular rate and rhythm, no S3, 1/6 systolic murmur, no pericardial rub. Abdomen: Soft, nontender, bowel sounds present. Extremities: No pitting edema, distal pulses present but diminished.  No ulcerations..  ECG:  An ECG dated 06/02/2023 was personally reviewed today and demonstrated:  Sinus rhythm with borderline low voltage, R'  in lead V1 and V2.  Labwork: 06/02/2023: ALT 13; AST 20 06/03/2023: BUN 12; Creatinine, Ser 0.75; Hemoglobin 12.5; Magnesium  2.0; Platelets 228; Potassium 3.8; Sodium 138     Component Value Date/Time   CHOL 170 02/20/2023 0350   TRIG 62 02/20/2023 0350   HDL 70 02/20/2023 0350   CHOLHDL 2.4 02/20/2023 0350   VLDL 12 02/20/2023 0350   LDLCALC 88 02/20/2023 0350  January 2025: Cholesterol 208, triglycerides 75, HDL 91, LDL 104, AST 19, ALT 9  Other Studies Reviewed Today:  No interval cardiac testing for review today.  Assessment and Plan:  1.  CAD status post DES to the proximal LAD in August 2024 with medically managed OM2 disease (small caliber), presented with unstable angina at that time.  Echocardiogram in December 2024 revealed normal LVEF at 60 to 65% without regional wall motion abnormalities.  Plan to continue Plavix  through  August of this year.  Otherwise continue aspirin  81 mg daily, Imdur  30 mg daily, Toprol -XL 50 mg daily, and as needed nitroglycerin .  Present lipid regimen includes Crestor  5 mg daily and Zetia  10 mg daily.  2.  Mixed hyperlipidemia with statin intolerance due to myalgias.  LDL 104 with HDL 91 in January.  Maximum tolerated Crestor  dose is 5 mg daily and she is also on Zetia  10 mg daily.  We will reschedule the lipid clinic consultation which was previously canceled, states that she can have someone go with her to this visit.  Would evaluate for PCSK9  inhibitors, she also has elevated LP(a).  Not clear that switching Zetia  to Nexlizet would get her LDL to goal.  3.  Asymptomatic, ascending aortic dilatation of 41 mm by chest CTA in December 2024.  This will be reimaged in December of this year.  4.  Primary hypertension.  Blood pressure rechecked by me today at 130/82.  No changes to baseline regimen.  5.  History of PAD status post previous right popliteal stent intervention.  ABIs normal in March 2024.  She is due for follow-up with [facility] next month with repeat ABIs.  Does report some leg weakness, no ulcerations.  Disposition:  Follow up  6 months.  Signed, Gerard Knight, M.D., F.A.C.C. [facility]

## 2023-10-26 NOTE — Patient Instructions (Signed)
 Medication Instructions:  Your physician has recommended you make the following change in your medication:  Stop your clopidogrel  at the end of August 2025 Continue all other medications as prescribed  Labwork: BMET (due 1-2 weeks before your CT in December 2025) Non-fasting [facility] ([address] [place])  Testing/Procedures: CT Angio Chest & Aorta in December 2025  Follow-Up: Your physician recommends that you schedule a follow-up appointment in: 6 months  Any Other Special Instructions Will Be Listed Below (If Applicable). Reschedule your Lipid Clinic appointment  If you need a refill on your cardiac medications before your next appointment, please call your pharmacy.

## 2023-10-26 NOTE — Telephone Encounter (Signed)
 Checking percert on the following patient for testing scheduled at [facility].    CT ANGIO CHEST AORTA W/CM&OR      06/02/2024

## 2023-10-27 DIAGNOSIS — I10 Essential (primary) hypertension: Secondary | ICD-10-CM | POA: Diagnosis not present

## 2023-10-27 DIAGNOSIS — E782 Mixed hyperlipidemia: Secondary | ICD-10-CM | POA: Diagnosis not present

## 2023-10-27 DIAGNOSIS — I2510 Atherosclerotic heart disease of native coronary artery without angina pectoris: Secondary | ICD-10-CM | POA: Diagnosis not present

## 2023-11-03 ENCOUNTER — Other Ambulatory Visit: Payer: Self-pay

## 2023-11-03 DIAGNOSIS — I739 Peripheral vascular disease, unspecified: Secondary | ICD-10-CM

## 2023-11-11 DIAGNOSIS — M6281 Muscle weakness (generalized): Secondary | ICD-10-CM | POA: Diagnosis not present

## 2023-11-11 DIAGNOSIS — R269 Unspecified abnormalities of gait and mobility: Secondary | ICD-10-CM | POA: Diagnosis not present

## 2023-11-11 NOTE — Progress Notes (Unsigned)
 Patient ID: Heidi Wallace, female   DOB: 12-01-1935, 88 y.o.   MRN: [id number]  Reason for Consult: No chief complaint on file.   Referred by Orest Bio, MD  Subjective:  HPI Heidi Wallace is a 88 y.o. female ***  Past Medical History:  Diagnosis Date   Chest pain    Chronic kidney disease    Coronary artery disease    a.  cath in 01/2023 in the setting of unstable angina showing 2-vessel CAD with 90% proximal LAD treated with DES placement and 85% stenosis along small caliber 2nd Mrg with medical management recommended given small vessel size and tortuosity   GERD (gastroesophageal reflux disease)    Headache    History of nuclear stress test 2017   low risk study   Hyperlipidemia    Hypertension    Post concussion syndrome    PVC's (premature ventricular contractions)    PVCs and ventricular bigeminy with abnormal nuclear test   Family History  Problem Relation Age of Onset   Heart disease Father    Heart disease Mother    Past Surgical History:  Procedure Laterality Date   ABDOMINAL AORTOGRAM W/LOWER EXTREMITY N/A 11/18/2021   Procedure: ABDOMINAL AORTOGRAM W/LOWER EXTREMITY;  Surgeon: Margherita Shell, MD;  Location: [facility] INVASIVE CV LAB;  Service: Cardiovascular;  Laterality: N/A;   BIOPSY  07/07/2018   Procedure: BIOPSY;  Surgeon: Ruby Corporal, MD;  Location: [facility] ENDO SU[facility];  Service: Endoscopy;;  gastric polyp   BREAST CYST EXCISION  benign right breast   1982   CARDIAC CATHETERIZATION  07/2011   [facility]; [place]   COLONOSCOPY N/A 07/07/2018   Procedure: COLONOSCOPY;  Surgeon: Ruby Corporal, MD;  Location: [facility];  Service: Endoscopy;  Laterality: N/A;  12:25   CORONARY STENT INTERVENTION N/A 02/19/2023   Procedure: CORONARY STENT INTERVENTION;  Surgeon: Swazil[place], Peter M, MD;  Location: [facility] INVASIVE CV LAB;  Service: Cardiovascular;  Laterality: N/A;   ESOPHAGOGASTRODUODENOSCOPY N/A 07/07/2018   Procedure: ESOPHAGOGASTRODUODENOSCOPY (EGD);  Surgeon: Ruby Corporal, MD;  Location: [facility];  Service: Endoscopy;  Laterality: N/A;   GALLBLADDER SURGERY  2003   LEFT HEART CATH AND CORONARY ANGIOGRAPHY N/A 02/19/2023   Procedure: LEFT HEART CATH AND CORONARY ANGIOGRAPHY;  Surgeon: Swazil[place], Peter M, MD;  Location[facility] INVASIVE CV LAB;  Service: Cardiovascular;  Laterality: N/A;   PERIPHERAL VASCULAR INTERVENTION  11/18/2021   Procedure: PERIPHERAL VASCULAR INTERVENTION;  Surgeon: Margherita Shell, MD;  Location: [facility] INVASIVE CV LAB;  Service: Cardiovascular;;  right SFA   POLYPECTOMY  07/07/2018   Procedure: POLYPECTOMY;  Surgeon: Ruby Corporal, MD;  Location: [facility];  Service: Endoscopy;;  colon    ROTATOR CUFF REPAIR  right 1989   TUBAL LIGATION  1974    Short Social History:  Social History   Tobacco Use   Smoking status: Former    Current packs/day: 0.00    Average packs/day: 1 pack/day for 16.0 years (16.0 ttl pk-yrs)    Types: Cigarettes    Start date: 06/29/1956    Quit date: 06/29/1972    Years since quitting: 51.4    Passive exposure: Never   Smokeless tobacco: Never  Substance Use Topics   Alcohol  use: No    Allergies  Allergen Reactions   Clindamycin/Lincomycin Other (See Comments)    c-diff    Levaquin [Levofloxacin In D5w] Itching    At IV site including red streaking   Lipitor [Atorvastatin]  Other (See Comments)    myalgia   Morphine Other (See Comments)    REACTION: Patient states that her mother coded when given this medication therefore patient refuses to take   Quinolones Other (See Comments)    Hx of aortic aneurysm   Ceclor [Cefaclor] Rash   Cephalosporins Rash   Keflex [Cephalexin] Rash   Penicillins Hives and Rash    Current Outpatient Medications  Medication Sig Dispense Refill   aspirin  EC 81 MG tablet Take 81 mg by mouth in the morning.     Cholecalciferol (VITAMIN D-3 PO) Take 2,000 Int'l Units/day by mouth daily.     clopidogrel  (PLAVIX ) 75 MG tablet Take 1 tablet (75 mg total) by mouth  daily. 90 tablet 3   esomeprazole (NEXIUM) 40 MG capsule Take 40 mg by mouth daily before breakfast.  3   ezetimibe  (ZETIA ) 10 MG tablet Take 10 mg by mouth in the morning.     gabapentin  (NEURONTIN ) 300 MG capsule Take 300 mg by mouth daily at 6 PM.     isosorbide  mononitrate (IMDUR ) 30 MG 24 hr tablet Take 1 tablet (30 mg total) by mouth daily. 90 tablet 3   metoprolol  succinate (TOPROL -XL) 50 MG 24 hr tablet Take 50 mg by mouth every evening.     nitroGLYCERIN  (NITROSTAT ) 0.4 MG SL tablet PLACE 1 TABLET UNDER THE TONGUE EVERY 5 MINUTES AS NEEDED FOR CHEST PAIN. 25 tablet 3   ROCKLATAN  0.02-0.005 % SOLN Place 1 drop into both eyes at bedtime.     rosuvastatin  (CRESTOR ) 5 MG tablet Take 5 mg by mouth daily.     sodium chloride  (MURO 128) 5 % ophthalmic solution Place 1 drop into both eyes 2 (two) times daily.     No current facility-administered medications for this visit.    REVIEW OF SYSTEMS  All other systems reviewed and are negative  Objective:  Objective   There were no vitals filed for this visit. There is no height or weight on file to calculate BMI.  Physical Exam General: no acute distress Cardiac: hemodynamically stable Pulm: normal work of breathing Abdomen: non-tender, no pulsatile mass*** Neuro: alert, no focal deficit Extremities: no edema, cyanosis or wounds*** Vascular:   Right: ***  Left: ***  Data: ABI ***  Duplex ***     Assessment/Plan:     Heidi Wallace is a 88 y.o. female with ***  Recommendations to optimize cardiovascular risk: Abstinence from all tobacco products. Blood glucose control with goal A1c < 7%. Blood pressure control with goal blood pressure < 140/90 mmHg. Lipid reduction therapy with goal LDL-C <100 mg/dL  Aspirin  81mg  PO QD.  Atorvastatin 40-80mg  PO QD (or other "high intensity" statin therapy).     Philipp Brawn MD [facility]

## 2023-11-12 ENCOUNTER — Encounter: Payer: Self-pay | Admitting: Vascular Surgery

## 2023-11-12 ENCOUNTER — Ambulatory Visit: Attending: Vascular Surgery | Admitting: Vascular Surgery

## 2023-11-12 ENCOUNTER — Ambulatory Visit (HOSPITAL_BASED_OUTPATIENT_CLINIC_OR_DEPARTMENT_OTHER)
Admission: RE | Admit: 2023-11-12 | Discharge: 2023-11-12 | Disposition: A | Discharge status: Home / Self Care | Source: Ambulatory Visit | Attending: Vascular Surgery | Admitting: Vascular Surgery

## 2023-11-12 ENCOUNTER — Ambulatory Visit (HOSPITAL_COMMUNITY)
Admission: RE | Admit: 2023-11-12 | Discharge: 2023-11-12 | Disposition: A | Discharge status: Home / Self Care | Source: Ambulatory Visit | Attending: Vascular Surgery | Admitting: Vascular Surgery

## 2023-11-12 VITALS — BP 171/84 | HR 70 | Temp 97.9°F | Resp 18 | Ht 63.0 in | Wt 109.7 lb

## 2023-11-12 DIAGNOSIS — I70221 Atherosclerosis of native arteries of extremities with rest pain, right leg: Secondary | ICD-10-CM

## 2023-11-12 DIAGNOSIS — I739 Peripheral vascular disease, unspecified: Secondary | ICD-10-CM

## 2023-11-12 LAB — VAS US ABI WITH/WO TBI
Left ABI: 1.05
Right ABI: 1.14

## 2023-11-15 DIAGNOSIS — M6281 Muscle weakness (generalized): Secondary | ICD-10-CM | POA: Diagnosis not present

## 2023-11-26 DIAGNOSIS — E782 Mixed hyperlipidemia: Secondary | ICD-10-CM | POA: Diagnosis not present

## 2023-11-26 DIAGNOSIS — I2510 Atherosclerotic heart disease of native coronary artery without angina pectoris: Secondary | ICD-10-CM | POA: Diagnosis not present

## 2023-11-26 DIAGNOSIS — I10 Essential (primary) hypertension: Secondary | ICD-10-CM | POA: Diagnosis not present

## 2023-11-30 DIAGNOSIS — J069 Acute upper respiratory infection, unspecified: Secondary | ICD-10-CM | POA: Diagnosis not present

## 2023-11-30 DIAGNOSIS — Z681 Body mass index (BMI) 19 or less, adult: Secondary | ICD-10-CM | POA: Diagnosis not present

## 2023-11-30 DIAGNOSIS — Z20828 Contact with and (suspected) exposure to other viral communicable diseases: Secondary | ICD-10-CM | POA: Diagnosis not present

## 2023-12-04 DIAGNOSIS — Z681 Body mass index (BMI) 19 or less, adult: Secondary | ICD-10-CM | POA: Diagnosis not present

## 2023-12-04 DIAGNOSIS — J0100 Acute maxillary sinusitis, unspecified: Secondary | ICD-10-CM | POA: Diagnosis not present

## 2023-12-07 DIAGNOSIS — M6281 Muscle weakness (generalized): Secondary | ICD-10-CM | POA: Diagnosis not present

## 2023-12-14 DIAGNOSIS — M6281 Muscle weakness (generalized): Secondary | ICD-10-CM | POA: Diagnosis not present

## 2023-12-14 DIAGNOSIS — R269 Unspecified abnormalities of gait and mobility: Secondary | ICD-10-CM | POA: Diagnosis not present

## 2023-12-15 DIAGNOSIS — K219 Gastro-esophageal reflux disease without esophagitis: Secondary | ICD-10-CM | POA: Diagnosis not present

## 2023-12-15 DIAGNOSIS — R5383 Other fatigue: Secondary | ICD-10-CM | POA: Diagnosis not present

## 2023-12-15 DIAGNOSIS — R059 Cough, unspecified: Secondary | ICD-10-CM | POA: Diagnosis not present

## 2023-12-15 DIAGNOSIS — I10 Essential (primary) hypertension: Secondary | ICD-10-CM | POA: Diagnosis not present

## 2023-12-15 DIAGNOSIS — Z681 Body mass index (BMI) 19 or less, adult: Secondary | ICD-10-CM | POA: Diagnosis not present

## 2023-12-17 ENCOUNTER — Ambulatory Visit: Admitting: Pharmacist

## 2023-12-20 DIAGNOSIS — R269 Unspecified abnormalities of gait and mobility: Secondary | ICD-10-CM | POA: Diagnosis not present

## 2023-12-20 DIAGNOSIS — M6281 Muscle weakness (generalized): Secondary | ICD-10-CM | POA: Diagnosis not present

## 2023-12-22 DIAGNOSIS — M6281 Muscle weakness (generalized): Secondary | ICD-10-CM | POA: Diagnosis not present

## 2023-12-23 DIAGNOSIS — R059 Cough, unspecified: Secondary | ICD-10-CM | POA: Diagnosis not present

## 2023-12-23 DIAGNOSIS — R051 Acute cough: Secondary | ICD-10-CM | POA: Diagnosis not present

## 2023-12-23 DIAGNOSIS — Z681 Body mass index (BMI) 19 or less, adult: Secondary | ICD-10-CM | POA: Diagnosis not present

## 2023-12-27 DIAGNOSIS — I2510 Atherosclerotic heart disease of native coronary artery without angina pectoris: Secondary | ICD-10-CM | POA: Diagnosis not present

## 2023-12-27 DIAGNOSIS — I10 Essential (primary) hypertension: Secondary | ICD-10-CM | POA: Diagnosis not present

## 2023-12-27 DIAGNOSIS — E782 Mixed hyperlipidemia: Secondary | ICD-10-CM | POA: Diagnosis not present

## 2023-12-27 DIAGNOSIS — M6281 Muscle weakness (generalized): Secondary | ICD-10-CM | POA: Diagnosis not present

## 2023-12-29 DIAGNOSIS — S6991XA Unspecified injury of right wrist, hand and finger(s), initial encounter: Secondary | ICD-10-CM | POA: Diagnosis not present

## 2023-12-29 DIAGNOSIS — I252 Old myocardial infarction: Secondary | ICD-10-CM | POA: Diagnosis not present

## 2023-12-29 DIAGNOSIS — W19XXXA Unspecified fall, initial encounter: Secondary | ICD-10-CM | POA: Diagnosis not present

## 2023-12-29 DIAGNOSIS — I2510 Atherosclerotic heart disease of native coronary artery without angina pectoris: Secondary | ICD-10-CM | POA: Diagnosis not present

## 2023-12-29 DIAGNOSIS — M47812 Spondylosis without myelopathy or radiculopathy, cervical region: Secondary | ICD-10-CM | POA: Diagnosis not present

## 2023-12-29 DIAGNOSIS — R609 Edema, unspecified: Secondary | ICD-10-CM | POA: Diagnosis not present

## 2023-12-29 DIAGNOSIS — S40012A Contusion of left shoulder, initial encounter: Secondary | ICD-10-CM | POA: Diagnosis not present

## 2023-12-29 DIAGNOSIS — S0181XA Laceration without foreign body of other part of head, initial encounter: Secondary | ICD-10-CM | POA: Diagnosis not present

## 2023-12-29 DIAGNOSIS — M4312 Spondylolisthesis, cervical region: Secondary | ICD-10-CM | POA: Diagnosis not present

## 2023-12-29 DIAGNOSIS — W010XXA Fall on same level from slipping, tripping and stumbling without subsequent striking against object, initial encounter: Secondary | ICD-10-CM | POA: Diagnosis not present

## 2023-12-29 DIAGNOSIS — M1811 Unilateral primary osteoarthritis of first carpometacarpal joint, right hand: Secondary | ICD-10-CM | POA: Diagnosis not present

## 2023-12-29 DIAGNOSIS — S01511A Laceration without foreign body of lip, initial encounter: Secondary | ICD-10-CM | POA: Diagnosis not present

## 2023-12-29 DIAGNOSIS — S0083XA Contusion of other part of head, initial encounter: Secondary | ICD-10-CM | POA: Diagnosis not present

## 2023-12-29 DIAGNOSIS — S0990XA Unspecified injury of head, initial encounter: Secondary | ICD-10-CM | POA: Diagnosis not present

## 2023-12-29 DIAGNOSIS — M19031 Primary osteoarthritis, right wrist: Secondary | ICD-10-CM | POA: Diagnosis not present

## 2023-12-29 DIAGNOSIS — M79622 Pain in left upper arm: Secondary | ICD-10-CM | POA: Diagnosis not present

## 2023-12-29 DIAGNOSIS — I10 Essential (primary) hypertension: Secondary | ICD-10-CM | POA: Diagnosis not present

## 2023-12-29 DIAGNOSIS — S52022A Displaced fracture of olecranon process without intraarticular extension of left ulna, initial encounter for closed fracture: Secondary | ICD-10-CM | POA: Diagnosis not present

## 2023-12-30 ENCOUNTER — Encounter (HOSPITAL_BASED_OUTPATIENT_CLINIC_OR_DEPARTMENT_OTHER): Payer: Self-pay | Admitting: Orthopaedic Surgery

## 2023-12-30 ENCOUNTER — Other Ambulatory Visit: Payer: Self-pay

## 2023-12-30 ENCOUNTER — Telehealth: Payer: Self-pay

## 2023-12-30 DIAGNOSIS — M79641 Pain in right hand: Secondary | ICD-10-CM | POA: Diagnosis not present

## 2023-12-30 NOTE — Telephone Encounter (Signed)
   Name: Heidi Wallace  DOB: 11/22/1935  MRN: [id number]  Primary Cardiologist: Jayson Sierras, MD   Preoperative team, please contact this patient and set up a phone call appointment for further preoperative risk assessment. Please obtain consent and complete medication review. Thank you for your help.  I confirm that guidance regarding antiplatelet and oral anticoagulation therapy has been completed and, if necessary, noted below.  Per office protocol, if patient is without any new symptoms or concerns at the time of their virtual visit, she may hold Plavix  for 5 days prior to procedure. Please resume Plavix  as soon as possible postprocedure, at the discretion of the surgeon.    Aspirin  should be continued without interruption  I also confirmed the patient resides in the state of [place] . As per [facility] Medical Board telemedicine laws, the patient must reside in the state in which the provider is licensed.   Lum LITTIE Louis, NP 12/30/2023, 4:53 PM [facility]

## 2023-12-30 NOTE — Telephone Encounter (Signed)
 Pt scheduled for a tele appt on 01/04/24 with Damien Braver, NP. Med rec and consent are done. Ok per [employer], NP to schedule. Pt is on plavix  and aspirin . OK per [place] to notify pt to hold plavix  5 days prior to procedure on 01/06/24 and to continue taking aspirin . Med rec and Consent are done.

## 2023-12-30 NOTE — Progress Notes (Signed)
   12/30/23 1537  Pre-op Phone Call  Surgery Date Verified 01/06/24  Arrival Time Verified  (office to call and let pt know)  Surgery Location Verified [facility]  Medical History Reviewed Yes  Is the patient taking a GLP-1 receptor agonist? No  Does the patient have diabetes? No diagnosis of diabetes  Do you have a history of heart problems? Yes  Cardiologist Name Jayson Sierras  Have you ever had tests on your heart? Yes  What cardiac tests were performed? Echo;EKG;Cardiac Cath  Results viewable: [facility] [employer] Tab  Does patient have other implanted devices? No  Patient Teaching Enhanced Recovery;Pre / Post Procedure  Patient educated about smoking cessation 24 hours prior to surgery. N/A Non-Smoker  Patient verbalizes understanding of bowel prep? N/A  THA/TKA patients only:  By your surgery date, will you have been taking narcotics for 90 days or greater? No  Med Rec Completed Yes  Take the Following Meds the Morning of Surgery nexium, crestor , zetia  crestor   Recent  Lab Work, EKG, CXR? No  NPO (Including gum & candy) After midnight  Allowed clear liquids Water ;Gatorade  (diabetics please choose diet or no sugar options)  Stop Solids, Milk, Candy, and Gum STARTING AT MIDNIGHT  Did patient view EMMI videos? No  Responsible adult to drive and be with you for 24 hours? Yes  Name & Phone Number for Ride/Caregiver Son Joey  No Jewelry, money, nail polish or make-up.  No lotions, powders, perfumes. No shaving  48 hrs. prior to surgery. Yes  Contacts, Dentures & Glasses Will Have to be Removed Before OR. Yes  Please bring your ID and Insurance Card the morning of your surgery. (Surgery Centers Only) Yes  Bring any papers or x-rays with you that your surgeon gave you. Yes  Instructed to contact the location of procedure/ provider if they or anyone in their household develops symptoms or tests positive for COVID-19, has close contact with someone who tests positive for COVID, or has known exposure  to any contagious illness. Yes  Call this number the morning of surgery  with any problems that may cancel your surgery. [phone]  Covid-19 Assessment  Have you had a positive COVID-19 test within the previous 90 days? No  COVID Testing Guidance Proceed with the additional questions.  Patient's surgery required a COVID-19 test (cardiothoracic, complex ENT, and bronchoscopies/ EBUS) No  Have you been unmasked and in close contact with anyone with COVID-19 or COVID-19 symptoms within the past 10 days? No  Do you or anyone in your household currently have any COVID-19 symptoms? No   Called dr. Henri office letting them know patient will need plavix  and ASA hold orders.

## 2023-12-30 NOTE — Telephone Encounter (Signed)
 Pt scheduled for a tele appt on 01/04/24 with Damien Braver, NP. Med rec and consent are done. Ok per [employer], NP to schedule. Pt is on plavix  and aspirin . OK per [place] to notify pt to hold plavix  5 days prior to procedure on 01/06/24 and to continue taking aspirin . Med rec and Consent are done.      Patient Consent for Virtual Visit        Heidi Wallace has provided verbal consent on 12/30/2023 for a virtual visit (video or telephone).   CONSENT FOR VIRTUAL VISIT FOR:  Heidi Wallace  By participating in this virtual visit I agree to the following:  I hereby voluntarily request, consent and authorize [facility] and its employed or contracted physicians, physician assistants, nurse practitioners or other licensed health care professionals (the Practitioner), to provide me with telemedicine health care services (the "Services) as deemed necessary by the treating Practitioner. I acknowledge and consent to receive the Services by the Practitioner via telemedicine. I understand that the telemedicine visit will involve communicating with the Practitioner through live audiovisual communication technology and the disclosure of certain medical information by electronic transmission. I acknowledge that I have been given the opportunity to request an in-person assessment or other available alternative prior to the telemedicine visit and am voluntarily participating in the telemedicine visit.  I understand that I have the right to withhold or withdraw my consent to the use of telemedicine in the course of my care at any time, without affecting my right to future care or treatment, and that the Practitioner or I may terminate the telemedicine visit at any time. I understand that I have the right to inspect all information obtained and/or recorded in the course of the telemedicine visit and may receive copies of available information for a reasonable fee.  I understand that some of the  potential risks of receiving the Services via telemedicine include:  Delay or interruption in medical evaluation due to technological equipment failure or disruption; Information transmitted may not be sufficient (e.g. poor resolution of images) to allow for appropriate medical decision making by the Practitioner; and/or  In rare instances, security protocols could fail, causing a breach of personal health information.  Furthermore, I acknowledge that it is my responsibility to provide information about my medical history, conditions and care that is complete and accurate to the best of my ability. I acknowledge that Practitioner's advice, recommendations, and/or decision may be based on factors not within their control, such as incomplete or inaccurate data provided by me or distortions of diagnostic images or specimens that may result from electronic transmissions. I understand that the practice of medicine is not an exact science and that Practitioner makes no warranties or guarantees regarding treatment outcomes. I acknowledge that a copy of this consent can be made available to me via my patient portal [facility] MyChart), or I can request a printed copy by calling the office of [facility].    I understand that my insurance will be billed for this visit.   I have read or had this consent read to me. I understand the contents of this consent, which adequately explains the benefits and risks of the Services being provided via telemedicine.  I have been provided ample opportunity to ask questions regarding this consent and the Services and have had my questions answered to my satisfaction. I give my informed consent for the services to be provided through the use of telemedicine in my  medical care

## 2023-12-30 NOTE — Telephone Encounter (Signed)
   Pre-operative Risk Assessment    Patient Name: Heidi Wallace  DOB: Aug 09, 1935 MRN: [id number]   Date of last office visit: 10/26/23 Date of next office visit: n/a   Request for Surgical Clearance    Procedure:  Left ORIF olecranon fracture    Date of Surgery:  Clearance 01/06/24                                 Surgeon:  Bonner Hair, MD  Surgeon's Group or Practice Name:  [facility]  Phone number:  [phone] Fax number:  [phone]   Type of Clearance Requested:   - Medical  - Pharmacy:  Hold Aspirin  and Clopidogrel  (Plavix ) Not indicated    Type of Anesthesia:  Choice    Additional requests/questions:    Bonney Rebeca Blight   12/30/2023, 4:28 PM

## 2024-01-04 ENCOUNTER — Ambulatory Visit: Attending: Cardiology | Admitting: Nurse Practitioner

## 2024-01-04 DIAGNOSIS — Z01810 Encounter for preprocedural cardiovascular examination: Secondary | ICD-10-CM

## 2024-01-04 NOTE — Progress Notes (Signed)
 Virtual Visit via Telephone Note   Because of Heidi Wallace co-morbid illnesses, she is at least at moderate risk for complications without adequate follow up.  This format is felt to be most appropriate for this patient at this time.  Due to technical limitations with video connection (technology), today's appointment will be conducted as an audio only telehealth visit, and Heidi Wallace verbally agreed to proceed in this manner.   All issues noted in this document were discussed and addressed.  No physical exam could be performed with this format.  Evaluation Performed:  Preoperative cardiovascular risk assessment _____________   Date:  01/04/2024   Patient ID:  Heidi Wallace, DOB 10/10/1935, MRN [id number] Patient Location:  Home Provider location:   Office  Primary Care Provider:  Atilano Deward ORN, MD Primary Cardiologist:  Heidi Sierras, MD  Chief Complaint / Patient Profile   88 y.o. y/o female with a h/o CAD s/p DES-pLAD in 01/2023, PVCs, hypertension, hyperlipidemia, PAD s/p prior right popliteal stenting, and GERD who is pending Left ORIF olecranon fracture with Dr. Bonner Hair of [facility] and presents today for telephonic preoperative cardiovascular risk assessment.  History of Present Illness    Heidi Wallace is a 88 y.o. female who presents via audio/video conferencing for a telehealth visit today.  Pt was last seen in cardiology clinic on 10/26/2023 by Dr. Sierras.  At that time Heidi Wallace was doing well.  The patient is now pending procedure as outlined above. Since her last visit, she has done well from a cardiac standpoint.  She denies chest pain, palpitations, dyspnea, pnd, orthopnea, n, v, dizziness, syncope, edema, weight gain, or early satiety. All other systems reviewed and are otherwise negative except as noted above.   Past Medical History    Past Medical History:  Diagnosis Date   Chest pain    Chronic kidney disease     Coronary artery disease    a.  cath in 01/2023 in the setting of unstable angina showing 2-vessel CAD with 90% proximal LAD treated with DES placement and 85% stenosis along small caliber 2nd Mrg with medical management recommended given small vessel size and tortuosity   GERD (gastroesophageal reflux disease)    Headache    History of nuclear stress test 2017   low risk study   Hyperlipidemia    Hypertension    Peripheral vascular disease (HCC)    Post concussion syndrome    PVC's (premature ventricular contractions)    PVCs and ventricular bigeminy with abnormal nuclear test   Past Surgical History:  Procedure Laterality Date   ABDOMINAL AORTOGRAM W/LOWER EXTREMITY N/A 11/18/2021   Procedure: ABDOMINAL AORTOGRAM W/LOWER EXTREMITY;  Surgeon: Serene Gaile ORN, MD;  Location: [facility];  Service: Cardiovascular;  Laterality: N/A;   BIOPSY  07/07/2018   Procedure: BIOPSY;  Surgeon: Golda Claudis PENNER, MD;  Location: [facility];  Service: Endoscopy;;  gastric polyp   BREAST CYST EXCISION  benign right breast   1982   CARDIAC CATHETERIZATION  07/2011   [facility]; [facility]   COLONOSCOPY N/A 07/07/2018   Procedure: COLONOSCOPY;  Surgeon: Golda Claudis PENNER, MD;  Location: [facility];  Service: Endoscopy;  Laterality: N/A;  12:25   CORONARY STENT INTERVENTION N/A 02/19/2023   Procedure: CORONARY STENT INTERVENTION;  Surgeon: Swaziland, Peter M, MD;  Location: [facility] INVASIVE CV LAB;  Service: Cardiovascular;  Laterality: N/A;   ESOPHAGOGASTRODUODENOSCOPY N/A 07/07/2018   Procedure: ESOPHAGOGASTRODUODENOSCOPY (EGD);  Surgeon:  Golda Claudis PENNER, MD;  [facility]: [facility];  Service: Endoscopy;  Laterality: N/A;   GALLBLADDER SURGERY  2003   LEFT HEART CATH AND CORONARY ANGIOGRAPHY N/A 02/19/2023   Procedure: LEFT HEART CATH AND CORONARY ANGIOGRAPHY;  Surgeon: Swaziland, Peter M, MD;  Location: [facility] INVASIVE CV LAB;  Service: Cardiovascular;  Laterality: N/A;   PERIPHERAL VASCULAR INTERVENTION  11/18/2021    Procedure: PERIPHERAL VASCULAR INTERVENTION;  Surgeon: Serene Gaile ORN, MD;  Location: [facility];  Service: Cardiovascular;;  right SFA   POLYPECTOMY  07/07/2018   Procedure: POLYPECTOMY;  Surgeon: Golda Claudis PENNER, MD;  [facility]: [facility];  Service: Endoscopy;;  colon    ROTATOR CUFF REPAIR  right 1989   TUBAL LIGATION  1974    Allergies  Allergies  Allergen Reactions   Clindamycin/Lincomycin Other (See Comments)    c-diff    Levaquin [Levofloxacin In D5w] Itching    At IV site including red streaking   Lipitor [Atorvastatin] Other (See Comments)    myalgia   Morphine Other (See Comments)    REACTION: Patient states that her mother coded when given this medication therefore patient refuses to take   Quinolones Other (See Comments)    Hx of aortic aneurysm   Ceclor [Cefaclor] Rash   Cephalosporins Rash   Keflex [Cephalexin] Rash   Penicillins Hives and Rash    Home Medications    Prior to Admission medications   Medication Sig Start Date End Date Taking? Authorizing Provider  aspirin  EC 81 MG tablet Take 81 mg by mouth in the morning. 07/17/18   Rehman, Claudis PENNER, MD  benzonatate (TESSALON) 200 MG capsule Take 200 mg by mouth 2 (two) times daily as needed for cough. 12/01/23   [provider]  Cholecalciferol (VITAMIN D-3 PO) Take 2,000 Int'l Units/day by mouth daily.    [provider]  clopidogrel  (PLAVIX ) 75 MG tablet Take 1 tablet (75 mg total) by mouth daily. 03/02/23   Miriam Norris, NP  esomeprazole (NEXIUM) 40 MG capsule Take 40 mg by mouth daily before breakfast. 11/07/16   [provider]  ezetimibe  (ZETIA ) 10 MG tablet Take 10 mg by mouth in the morning.    [provider]  famotidine  (PEPCID ) 40 MG tablet Take 40 mg by mouth daily. 12/15/23   [provider]  gabapentin  (NEURONTIN ) 300 MG capsule Take 300 mg by mouth daily at 6 PM.    [provider]  isosorbide  mononitrate (IMDUR ) 30 MG 24 hr tablet Take  1 tablet (30 mg total) by mouth daily. 06/16/23   Jeffrie Oneil BROCKS, MD  metoprolol  succinate (TOPROL -XL) 50 MG 24 hr tablet Take 50 mg by mouth every evening.    [provider]  nitroGLYCERIN  (NITROSTAT ) 0.4 MG SL tablet PLACE 1 TABLET UNDER THE TONGUE EVERY 5 MINUTES AS NEEDED FOR CHEST PAIN. 09/22/23   Debera Heidi MATSU, MD  ROCKLATAN  0.02-0.005 % SOLN Place 1 drop into both eyes at bedtime. 12/09/20   [provider]  rosuvastatin  (CRESTOR ) 5 MG tablet Take 5 mg by mouth daily. 08/18/23   [provider]  sodium chloride  (MURO 128) 5 % ophthalmic solution Place 1 drop into both eyes 2 (two) times daily.    [provider]    Physical Exam    Vital Signs:  Heidi Wallace does not have vital signs available for review today.  Given telephonic nature of communication, physical exam is limited. AAOx3. NAD. Normal affect.  Speech and  respirations are unlabored.  Accessory Clinical Findings    None  Assessment & Plan    1.  Preoperative Cardiovascular Risk Assessment:  According to the Revised Cardiac Risk Index (RCRI), her Perioperative Risk of Major Cardiac Event is (%): 0.9. Her Functional Capacity in METs is: 5.07 according to the Duke Activity Status Index (DASI).Therefore, based on ACC/AHA guidelines, patient would be at acceptable risk for the planned procedure without further cardiovascular testing.   The patient was advised that if she develops new symptoms prior to surgery to contact our office to arrange for a follow-up visit, and she verbalized understanding.  Per office protocol, she may hold Plavix  for 5 days prior to procedure. Please resume Plavix  as soon as possible postprocedure, at the discretion of the surgeon.     Aspirin  should be continued without interruption as patient is post PCI/DES in 01/2023.  A copy of this note will be routed to requesting surgeon.  Time:   Today, I have spent 5 minutes with the patient with telehealth  technology discussing medical history, symptoms, and management plan.     Damien Heidi Braver, NP  01/04/2024, 3:09 PM

## 2024-01-05 NOTE — H&P (Signed)
 PREOPERATIVE H&P  Chief Complaint: left elbow olecranon fracture  HPI: Heidi Wallace is a 88 y.o. female who is scheduled for, Procedure(s): OPEN REDUCTION INTERNAL FIXATION (ORIF) ELBOW/OLECRANON FRACTURE.   Patient is an 88 year-old who has a history of coronary artery disease with a stent last year who fell on December 29, 2023.  She was evaluated at the [facility] Emergency Room and was told she had an olecranon fracture.  She also has pain in her right ulnar hand.   Symptoms are rated as moderate to severe, and have been worsening.  This is significantly impairing activities of daily living.    Please see clinic note for further details on this patient's care.    She has elected for surgical management.   Past Medical History:  Diagnosis Date   Chest pain    Chronic kidney disease    Coronary artery disease    a.  cath in 01/2023 in the setting of unstable angina showing 2-vessel CAD with 90% proximal LAD treated with DES placement and 85% stenosis along small caliber 2nd Mrg with medical management recommended given small vessel size and tortuosity   GERD (gastroesophageal reflux disease)    Headache    History of nuclear stress test 2017   low risk study   Hyperlipidemia    Hypertension    Peripheral vascular disease (HCC)    Post concussion syndrome    PVC's (premature ventricular contractions)    PVCs and ventricular bigeminy with abnormal nuclear test   Past Surgical History:  Procedure Laterality Date   ABDOMINAL AORTOGRAM W/LOWER EXTREMITY N/A 11/18/2021   Procedure: ABDOMINAL AORTOGRAM W/LOWER EXTREMITY;  Surgeon: Serene Gaile ORN, MD;  Location: [facility];  Service: Cardiovascular;  Laterality: N/A;   BIOPSY  07/07/2018   Procedure: BIOPSY;  Surgeon: Golda Claudis PENNER, MD;  [facility];  Service: Endoscopy;;  gastric polyp   BREAST CYST EXCISION  benign right breast   1982   CARDIAC CATHETERIZATION  07/2011   [facility]   COLONOSCOPY N/A 07/07/2018    Procedure: COLONOSCOPY;  Surgeon: Golda Claudis PENNER, MD;  [facility]: [facility];  Service: Endoscopy;  Laterality: N/A;  12:25   CORONARY STENT INTERVENTION N/A 02/19/2023   Procedure: CORONARY STENT INTERVENTION;  Surgeon: Swaziland, Peter M, MD;  Location: [facility] INVASIVE CV LAB;  Service: Cardiovascular;  Laterality: N/A;   ESOPHAGOGASTRODUODENOSCOPY N/A 07/07/2018   Procedure: ESOPHAGOGASTRODUODENOSCOPY (EGD);  Surgeon: Golda Claudis PENNER, MD;  [facility];  Service: Endoscopy;  Laterality: N/A;   GALLBLADDER SURGERY  2003   LEFT HEART CATH AND CORONARY ANGIOGRAPHY N/A 02/19/2023   Procedure: LEFT HEART CATH AND CORONARY ANGIOGRAPHY;  Surgeon: Swaziland, Peter M, MD;  Location: [facility] INVASIVE CV LAB;  Service: Cardiovascular;  Laterality: N/A;   PERIPHERAL VASCULAR INTERVENTION  11/18/2021   Procedure: PERIPHERAL VASCULAR INTERVENTION;  Surgeon: Serene Gaile ORN, MD;  Location: [facility];  Service: Cardiovascular;;  right SFA   POLYPECTOMY  07/07/2018   Procedure: POLYPECTOMY;  Surgeon: Golda Claudis PENNER, MD;  [facility]: [facility];  Service: Endoscopy;;  colon    ROTATOR CUFF REPAIR  right 1989   TUBAL LIGATION  1974   Social History   Socioeconomic History   Marital status: Married    Spouse name: Not on file   Number of children: Not on file   Years of education: Not on file   Highest education level: Not on file  Occupational History  Occupation: former [employer] fundraiser  Tobacco Use   Smoking status: Former    Current packs/day: 0.00    Average packs/day: 1 pack/day for 16.0 years (16.0 ttl pk-yrs)    Types: Cigarettes    Start date: 06/29/1956    Quit date: 06/29/1972    Years since quitting: 51.5    Passive exposure: Never   Smokeless tobacco: Never  Vaping Use   Vaping status: Never Used  Substance and Sexual Activity   Alcohol  use: No   Drug use: No   Sexual activity: Not on file  Other Topics Concern   Not on file  Social History Narrative   Not on file   Social Drivers  of Health   Financial Resource Strain: Not on file  Food Insecurity: No Food Insecurity (02/19/2023)   Hunger Vital Sign    Worried About Running Out of Food in the Last Year: Never true    Ran Out of Food in the Last Year: Never true  Transportation Needs: No Transportation Needs (02/19/2023)   PRAPARE - Administrator, Civil Service (Medical): No    Lack of Transportation (Non-Medical): No  Physical Activity: Not on file  Stress: Not on file  Social Connections: Not on file   Family History  Problem Relation Age of Onset   Heart disease Father    Heart disease Mother    Allergies  Allergen Reactions   Clindamycin/Lincomycin Other (See Comments)    c-diff    Levaquin [Levofloxacin In D5w] Itching    At IV site including red streaking   Lipitor [Atorvastatin] Other (See Comments)    myalgia   Morphine Other (See Comments)    REACTION: Patient states that her mother coded when given this medication therefore patient refuses to take   Quinolones Other (See Comments)    Hx of aortic aneurysm   Ceclor [Cefaclor] Rash   Cephalosporins Rash   Keflex [Cephalexin] Rash   Penicillins Hives and Rash   Prior to Admission medications   Medication Sig Start Date End Date Taking? Authorizing Provider  aspirin  EC 81 MG tablet Take 81 mg by mouth in the morning. 07/17/18  Yes Rehman, Claudis PENNER, MD  Cholecalciferol (VITAMIN D-3 PO) Take 2,000 Int'l Units/day by mouth daily.   Yes [provider]  clopidogrel  (PLAVIX ) 75 MG tablet Take 1 tablet (75 mg total) by mouth daily. 03/02/23  Yes Miriam Norris, NP  esomeprazole (NEXIUM) 40 MG capsule Take 40 mg by mouth daily before breakfast. 11/07/16  Yes [provider]  ezetimibe  (ZETIA ) 10 MG tablet Take 10 mg by mouth in the morning.   Yes [provider]  famotidine  (PEPCID ) 40 MG tablet Take 40 mg by mouth daily. 12/15/23  Yes [provider]  gabapentin  (NEURONTIN ) 300 MG capsule Take 300 mg by  mouth daily at 6 PM.   Yes [provider]  isosorbide  mononitrate (IMDUR ) 30 MG 24 hr tablet Take 1 tablet (30 mg total) by mouth daily. 06/16/23  Yes Jeffrie Oneil BROCKS, MD  metoprolol  succinate (TOPROL -XL) 50 MG 24 hr tablet Take 50 mg by mouth every evening.   Yes [provider]  ROCKLATAN  0.02-0.005 % SOLN Place 1 drop into both eyes at bedtime. 12/09/20  Yes [provider]  rosuvastatin  (CRESTOR ) 5 MG tablet Take 5 mg by mouth daily. 08/18/23  Yes [provider]  sodium chloride  (MURO 128) 5 % ophthalmic solution Place 1 drop into both eyes 2 (two) times daily.  Yes [provider]  benzonatate (TESSALON) 200 MG capsule Take 200 mg by mouth 2 (two) times daily as needed for cough. 12/01/23   [provider]  nitroGLYCERIN  (NITROSTAT ) 0.4 MG SL tablet PLACE 1 TABLET UNDER THE TONGUE EVERY 5 MINUTES AS NEEDED FOR CHEST PAIN. 09/22/23   Debera Jayson MATSU, MD    ROS: All other systems have been reviewed and were otherwise negative with the exception of those mentioned in the HPI and as above.  Physical Exam: General: Alert, no acute distress Cardiovascular: No pedal edema Respiratory: No cyanosis, no use of accessory musculature GI: No organomegaly, abdomen is soft and non-tender Skin: No lesions in the area of chief complaint Neurologic: Sensation intact distally Psychiatric: Patient is competent for consent with normal mood and affect Lymphatic: No axillary or cervical lymphadenopathy  MUSCULOSKELETAL:  Range of motion of the elbow is not tested in the early setting after known fracture.  She has scattered bruising throughout her body and tenderness to palpation at the right small finger metacarpal head.    Imaging: X-rays reviewed and imported into our system from her emergency room visit demonstrate a displaced distracted olecranon fracture that is relatively transverse in nature.  Two centimeters of diastasis.    Assessment: left  elbow olecranon fracture  Plan: Plan for Procedure(s): OPEN REDUCTION INTERNAL FIXATION (ORIF) ELBOW/OLECRANON FRACTURE  Based on the patient's fracture, she has significant displacement.  We think it would be benefited by surgery.  The risks, benefits and alternatives of an open reduction internal fixation were discussed.  We will work on surgical scheduling urgently.  She will get clearance from a cardiologist.    The risks benefits and alternatives were discussed with the patient including but not limited to the risks of nonoperative treatment, versus surgical intervention including infection, bleeding, nerve injury,  blood clots, cardiopulmonary complications, morbidity, mortality, among others, and they were willing to proceed.   The patient acknowledged the explanation, agreed to proceed with the plan and consent was signed.   Operative Plan: see above Discharge Medications: standard DVT Prophylaxis: none Physical Therapy: outpatient PT Special Discharge needs: Splint. Sling   Heidi LOISE Stalling, PA-C  01/05/2024 8:06 PM

## 2024-01-06 ENCOUNTER — Other Ambulatory Visit: Payer: Self-pay

## 2024-01-06 ENCOUNTER — Ambulatory Visit (HOSPITAL_COMMUNITY)

## 2024-01-06 ENCOUNTER — Encounter (HOSPITAL_BASED_OUTPATIENT_CLINIC_OR_DEPARTMENT_OTHER): Payer: Self-pay | Admitting: Anesthesiology

## 2024-01-06 ENCOUNTER — Ambulatory Visit (HOSPITAL_BASED_OUTPATIENT_CLINIC_OR_DEPARTMENT_OTHER): Payer: Self-pay | Admitting: Anesthesiology

## 2024-01-06 ENCOUNTER — Encounter (HOSPITAL_BASED_OUTPATIENT_CLINIC_OR_DEPARTMENT_OTHER): Payer: Self-pay | Admitting: Orthopaedic Surgery

## 2024-01-06 ENCOUNTER — Ambulatory Visit (HOSPITAL_BASED_OUTPATIENT_CLINIC_OR_DEPARTMENT_OTHER)
Admission: RE | Admit: 2024-01-06 | Discharge: 2024-01-06 | Disposition: A | Discharge status: Home / Self Care | Attending: Orthopaedic Surgery | Admitting: Orthopaedic Surgery

## 2024-01-06 ENCOUNTER — Ambulatory Visit (HOSPITAL_BASED_OUTPATIENT_CLINIC_OR_DEPARTMENT_OTHER)

## 2024-01-06 ENCOUNTER — Encounter (HOSPITAL_BASED_OUTPATIENT_CLINIC_OR_DEPARTMENT_OTHER)
Admission: RE | Disposition: A | Discharge status: Home / Self Care | Payer: Self-pay | Source: Home / Self Care | Attending: Orthopaedic Surgery

## 2024-01-06 DIAGNOSIS — Z7982 Long term (current) use of aspirin: Secondary | ICD-10-CM | POA: Insufficient documentation

## 2024-01-06 DIAGNOSIS — K219 Gastro-esophageal reflux disease without esophagitis: Secondary | ICD-10-CM | POA: Insufficient documentation

## 2024-01-06 DIAGNOSIS — E785 Hyperlipidemia, unspecified: Secondary | ICD-10-CM | POA: Insufficient documentation

## 2024-01-06 DIAGNOSIS — Z96622 Presence of left artificial elbow joint: Secondary | ICD-10-CM | POA: Diagnosis not present

## 2024-01-06 DIAGNOSIS — R519 Headache, unspecified: Secondary | ICD-10-CM | POA: Diagnosis not present

## 2024-01-06 DIAGNOSIS — N189 Chronic kidney disease, unspecified: Secondary | ICD-10-CM | POA: Diagnosis not present

## 2024-01-06 DIAGNOSIS — Z87891 Personal history of nicotine dependence: Secondary | ICD-10-CM | POA: Diagnosis not present

## 2024-01-06 DIAGNOSIS — S52002A Unspecified fracture of upper end of left ulna, initial encounter for closed fracture: Secondary | ICD-10-CM

## 2024-01-06 DIAGNOSIS — I129 Hypertensive chronic kidney disease with stage 1 through stage 4 chronic kidney disease, or unspecified chronic kidney disease: Secondary | ICD-10-CM | POA: Diagnosis not present

## 2024-01-06 DIAGNOSIS — G8918 Other acute postprocedural pain: Secondary | ICD-10-CM | POA: Diagnosis not present

## 2024-01-06 DIAGNOSIS — Z955 Presence of coronary angioplasty implant and graft: Secondary | ICD-10-CM | POA: Diagnosis not present

## 2024-01-06 DIAGNOSIS — I2510 Atherosclerotic heart disease of native coronary artery without angina pectoris: Secondary | ICD-10-CM | POA: Insufficient documentation

## 2024-01-06 DIAGNOSIS — Z7902 Long term (current) use of antithrombotics/antiplatelets: Secondary | ICD-10-CM | POA: Diagnosis not present

## 2024-01-06 DIAGNOSIS — I10 Essential (primary) hypertension: Secondary | ICD-10-CM

## 2024-01-06 DIAGNOSIS — Z79899 Other long term (current) drug therapy: Secondary | ICD-10-CM | POA: Diagnosis not present

## 2024-01-06 DIAGNOSIS — I252 Old myocardial infarction: Secondary | ICD-10-CM | POA: Insufficient documentation

## 2024-01-06 DIAGNOSIS — S52022A Displaced fracture of olecranon process without intraarticular extension of left ulna, initial encounter for closed fracture: Secondary | ICD-10-CM | POA: Insufficient documentation

## 2024-01-06 DIAGNOSIS — W19XXXA Unspecified fall, initial encounter: Secondary | ICD-10-CM | POA: Insufficient documentation

## 2024-01-06 DIAGNOSIS — I739 Peripheral vascular disease, unspecified: Secondary | ICD-10-CM | POA: Diagnosis not present

## 2024-01-06 HISTORY — PX: ORIF ELBOW FRACTURE: SHX5031

## 2024-01-06 SURGERY — OPEN REDUCTION INTERNAL FIXATION (ORIF) ELBOW/OLECRANON FRACTURE
Anesthesia: General | Site: Elbow | Laterality: Left

## 2024-01-06 MED ORDER — OXYCODONE HCL 5 MG/5ML PO SOLN: 5.0000 mg | Freq: Once | ORAL | Status: DC | PRN

## 2024-01-06 MED ORDER — PROPOFOL 10 MG/ML IV BOLUS
INTRAVENOUS | Status: AC
  Filled 2024-01-06: qty 20

## 2024-01-06 MED ORDER — FENTANYL CITRATE (PF) 100 MCG/2ML IJ SOLN
INTRAMUSCULAR | Status: AC
  Filled 2024-01-06: qty 2

## 2024-01-06 MED ORDER — PROPOFOL 500 MG/50ML IV EMUL
INTRAVENOUS | Status: DC | PRN
  Administered 2024-01-06: 50 ug/kg/min via INTRAVENOUS

## 2024-01-06 MED ORDER — CEFAZOLIN SODIUM-DEXTROSE 2-4 GM/100ML-% IV SOLN
INTRAVENOUS | Status: AC
  Filled 2024-01-06: qty 100

## 2024-01-06 MED ORDER — FENTANYL CITRATE (PF) 100 MCG/2ML IJ SOLN: 25.0000 ug | INTRAMUSCULAR | Status: DC | PRN

## 2024-01-06 MED ORDER — BUPIVACAINE-EPINEPHRINE (PF) 0.5% -1:200000 IJ SOLN
INTRAMUSCULAR | Status: DC | PRN
  Administered 2024-01-06: 10 mL via PERINEURAL

## 2024-01-06 MED ORDER — BUPIVACAINE LIPOSOME 1.3 % IJ SUSP
INTRAMUSCULAR | Status: DC | PRN
  Administered 2024-01-06: 10 mL via PERINEURAL

## 2024-01-06 MED ORDER — LIDOCAINE 2% (20 MG/ML) 5 ML SYRINGE
INTRAMUSCULAR | Status: AC
  Filled 2024-01-06: qty 5

## 2024-01-06 MED ORDER — ACETAMINOPHEN 500 MG PO TABS
ORAL_TABLET | ORAL | Status: AC
  Filled 2024-01-06: qty 2

## 2024-01-06 MED ORDER — OXYCODONE HCL 5 MG PO TABS: 5.0000 mg | ORAL_TABLET | Freq: Once | ORAL | Status: DC | PRN

## 2024-01-06 MED ORDER — HYDROCODONE-ACETAMINOPHEN 5-325 MG PO TABS: 1.0000 | ORAL_TABLET | Freq: Four times a day (QID) | ORAL | 0 refills | Status: DC | PRN

## 2024-01-06 MED ORDER — DEXAMETHASONE SODIUM PHOSPHATE 10 MG/ML IJ SOLN
INTRAMUSCULAR | Status: AC
  Filled 2024-01-06: qty 1

## 2024-01-06 MED ORDER — CELECOXIB 200 MG PO CAPS: 200.0000 mg | ORAL_CAPSULE | Freq: Once | ORAL | Status: DC

## 2024-01-06 MED ORDER — LIDOCAINE HCL (CARDIAC) PF 100 MG/5ML IV SOSY
PREFILLED_SYRINGE | INTRAVENOUS | Status: DC | PRN
  Administered 2024-01-06: 100 mg via INTRAVENOUS

## 2024-01-06 MED ORDER — GABAPENTIN 300 MG PO CAPS
ORAL_CAPSULE | ORAL | Status: AC
  Filled 2024-01-06: qty 1

## 2024-01-06 MED ORDER — FENTANYL CITRATE (PF) 100 MCG/2ML IJ SOLN
100.0000 ug | Freq: Once | INTRAMUSCULAR | Status: AC
  Administered 2024-01-06: 100 ug via INTRAVENOUS

## 2024-01-06 MED ORDER — SUGAMMADEX SODIUM 200 MG/2ML IV SOLN
INTRAVENOUS | Status: DC | PRN
  Administered 2024-01-06: 200 mg via INTRAVENOUS

## 2024-01-06 MED ORDER — SUGAMMADEX SODIUM 200 MG/2ML IV SOLN
INTRAVENOUS | Status: AC
  Filled 2024-01-06: qty 2

## 2024-01-06 MED ORDER — ONDANSETRON HCL 4 MG/2ML IJ SOLN
INTRAMUSCULAR | Status: DC | PRN
  Administered 2024-01-06: 4 mg via INTRAVENOUS

## 2024-01-06 MED ORDER — ACETAMINOPHEN 500 MG PO TABS: 500.0000 mg | ORAL_TABLET | Freq: Three times a day (TID) | ORAL | 0 refills | Status: AC

## 2024-01-06 MED ORDER — ONDANSETRON HCL 4 MG PO TABS: 4.0000 mg | ORAL_TABLET | Freq: Three times a day (TID) | ORAL | 0 refills | Status: AC | PRN

## 2024-01-06 MED ORDER — ACETAMINOPHEN 500 MG PO TABS: 1000.0000 mg | ORAL_TABLET | Freq: Once | ORAL | Status: DC

## 2024-01-06 MED ORDER — VANCOMYCIN HCL 1000 MG IV SOLR
INTRAVENOUS | Status: DC | PRN
  Administered 2024-01-06: 1000 mg via TOPICAL

## 2024-01-06 MED ORDER — SULFAMETHOXAZOLE-TRIMETHOPRIM 800-160 MG PO TABS: 1.0000 | ORAL_TABLET | Freq: Two times a day (BID) | ORAL | 0 refills | Status: AC

## 2024-01-06 MED ORDER — GABAPENTIN 300 MG PO CAPS
300.0000 mg | ORAL_CAPSULE | Freq: Once | ORAL | Status: AC
Start: 2024-01-06 — End: 2024-01-06
  Administered 2024-01-06: 300 mg via ORAL

## 2024-01-06 MED ORDER — MEPERIDINE HCL 25 MG/ML IJ SOLN: 6.2500 mg | INTRAMUSCULAR | Status: DC | PRN

## 2024-01-06 MED ORDER — PROPOFOL 10 MG/ML IV BOLUS
INTRAVENOUS | Status: DC | PRN
  Administered 2024-01-06: 50 mg via INTRAVENOUS
  Administered 2024-01-06: 150 mg via INTRAVENOUS

## 2024-01-06 MED ORDER — ONDANSETRON HCL 4 MG/2ML IJ SOLN
INTRAMUSCULAR | Status: AC
  Filled 2024-01-06: qty 2

## 2024-01-06 MED ORDER — ACETAMINOPHEN 500 MG PO TABS
1000.0000 mg | ORAL_TABLET | Freq: Once | ORAL | Status: AC
  Administered 2024-01-06: 1000 mg via ORAL

## 2024-01-06 MED ORDER — LABETALOL HCL 5 MG/ML IV SOLN
INTRAVENOUS | Status: DC | PRN
Start: 2024-01-06 — End: 2024-01-06
  Administered 2024-01-06: 2.5 mg via INTRAVENOUS

## 2024-01-06 MED ORDER — MIDAZOLAM HCL 2 MG/2ML IJ SOLN
INTRAMUSCULAR | Status: AC
  Filled 2024-01-06: qty 2

## 2024-01-06 MED ORDER — ROCURONIUM BROMIDE 100 MG/10ML IV SOLN
INTRAVENOUS | Status: DC | PRN
  Administered 2024-01-06: 30 mg via INTRAVENOUS

## 2024-01-06 MED ORDER — ROCURONIUM BROMIDE 10 MG/ML (PF) SYRINGE
PREFILLED_SYRINGE | INTRAVENOUS | Status: AC
  Filled 2024-01-06: qty 10

## 2024-01-06 MED ORDER — LACTATED RINGERS IV SOLN: INTRAVENOUS | Status: DC

## 2024-01-06 MED ORDER — LABETALOL HCL 5 MG/ML IV SOLN
INTRAVENOUS | Status: AC
  Filled 2024-01-06: qty 4

## 2024-01-06 MED ORDER — CEFAZOLIN SODIUM-DEXTROSE 2-4 GM/100ML-% IV SOLN
2.0000 g | INTRAVENOUS | Status: AC
  Administered 2024-01-06: 2 g via INTRAVENOUS

## 2024-01-06 MED ORDER — ONDANSETRON HCL 4 MG/2ML IJ SOLN: 4.0000 mg | Freq: Once | INTRAMUSCULAR | Status: DC | PRN

## 2024-01-06 SURGICAL SUPPLY — 59 items
BIT DRILL 5.0 QC 6.5 (BIT) IMPLANT
BLADE HEX COATED 2.75 (ELECTRODE) ×1 IMPLANT
BLADE SURG 10 STRL SS (BLADE) ×1 IMPLANT
BLADE SURG 15 STRL LF DISP TIS (BLADE) ×1 IMPLANT
BNDG COMPR ESMARK 4X3 LF (GAUZE/BANDAGES/DRESSINGS) IMPLANT
BNDG ELASTIC 4INX 5YD STR LF (GAUZE/BANDAGES/DRESSINGS) ×1 IMPLANT
BNDG ELASTIC 6INX 5YD STR LF (GAUZE/BANDAGES/DRESSINGS) IMPLANT
CHLORAPREP W/TINT 26 (MISCELLANEOUS) ×1 IMPLANT
CLSR STERI-STRIP ANTIMIC 1/2X4 (GAUZE/BANDAGES/DRESSINGS) ×1 IMPLANT
CUFF TOURN SGL QUICK 18X3 (MISCELLANEOUS) ×1 IMPLANT
CUFF TRNQT CYL 24X4X16.5-23 (TOURNIQUET CUFF) IMPLANT
DRAPE C-ARM 42X72 X-RAY (DRAPES) IMPLANT
DRAPE C-ARMOR (DRAPES) IMPLANT
DRAPE EXTREMITY T 121X128X90 (DISPOSABLE) ×1 IMPLANT
DRAPE INCISE IOBAN 66X45 STRL (DRAPES) IMPLANT
DRAPE OEC MINIVIEW 54X84 (DRAPES) IMPLANT
DRAPE STERI 35X30 U-POUCH (DRAPES) IMPLANT
DRAPE U-SHAPE 47X51 STRL (DRAPES) ×1 IMPLANT
ELECTRODE REM PT RTRN 9FT ADLT (ELECTROSURGICAL) ×1 IMPLANT
EXTENSION HOSE W/PLC CONNECTON (MISCELLANEOUS) IMPLANT
GAUZE SPONGE 4X4 12PLY STRL (GAUZE/BANDAGES/DRESSINGS) ×1 IMPLANT
GAUZE XEROFORM 1X8 LF (GAUZE/BANDAGES/DRESSINGS) IMPLANT
GLOVE BIO SURGEON STRL SZ 6.5 (GLOVE) ×1 IMPLANT
GLOVE BIOGEL PI IND STRL 6.5 (GLOVE) ×1 IMPLANT
GLOVE BIOGEL PI IND STRL 8 (GLOVE) ×1 IMPLANT
GLOVE ECLIPSE 8.0 STRL XLNG CF (GLOVE) ×1 IMPLANT
GOWN STRL REUS W/ TWL LRG LVL3 (GOWN DISPOSABLE) ×2 IMPLANT
GOWN STRL REUS W/TWL XL LVL3 (GOWN DISPOSABLE) ×1 IMPLANT
GUIDEWIRE 3.2 THRD (WIRE) IMPLANT
LOOP VASCLR MAXI BLUE 18IN ST (MISCELLANEOUS) IMPLANT
LOOPS VASCLR MAXI BLUE 18IN ST (MISCELLANEOUS) IMPLANT
NS IRRIG 1000ML POUR BTL (IV SOLUTION) ×1 IMPLANT
PACK ARTHROSCOPY DSU (CUSTOM PROCEDURE TRAY) ×1 IMPLANT
PACK BASIN DAY SURGERY FS (CUSTOM PROCEDURE TRAY) ×1 IMPLANT
PAD CAST 4YDX4 CTTN HI CHSV (CAST SUPPLIES) ×1 IMPLANT
PENCIL SMOKE EVACUATOR (MISCELLANEOUS) ×1 IMPLANT
RETRIEVER SUT HEWSON (MISCELLANEOUS) IMPLANT
SCREW PT 6.5X105 46 (Screw) IMPLANT
SHEET MEDIUM DRAPE 40X70 STRL (DRAPES) ×1 IMPLANT
SLEEVE SCD COMPRESS KNEE MED (STOCKING) IMPLANT
SLING ARM FOAM STRAP LRG (SOFTGOODS) ×1 IMPLANT
SLING ARM FOAM STRAP MED (SOFTGOODS) IMPLANT
SPIKE FLUID TRANSFER (MISCELLANEOUS) IMPLANT
SPLINT PLASTER CAST FAST 5X30 (CAST SUPPLIES) ×10 IMPLANT
SPONGE T-LAP 18X18 ~~LOC~~+RFID (SPONGE) ×1 IMPLANT
SUCTION TUBE FRAZIER 10FR DISP (SUCTIONS) IMPLANT
SUT ETHILON 3 0 PS 1 (SUTURE) IMPLANT
SUT MNCRL AB 4-0 PS2 18 (SUTURE) IMPLANT
SUT VIC AB 0 CT1 27XBRD ANBCTR (SUTURE) ×1 IMPLANT
SUT VIC AB 1 CT1 27XBRD ANBCTR (SUTURE) IMPLANT
SUT VIC AB 2-0 CT1 TAPERPNT 27 (SUTURE) IMPLANT
SUT VIC AB 2-0 SH 27XBRD (SUTURE) ×1 IMPLANT
SUT VIC AB 3-0 SH 27X BRD (SUTURE) IMPLANT
SUTURE FIBERWR #2 38 T-5 BLUE (SUTURE) IMPLANT
TAP CANN 6.5 (TAP) IMPLANT
TOWEL GREEN STERILE FF (TOWEL DISPOSABLE) ×1 IMPLANT
TUBE CONNECTING 20X1/4 (TUBING) ×1 IMPLANT
TUBE SUCTION HIGH CAP CLEAR NV (SUCTIONS) ×1 IMPLANT
WASHER CANN 12.7 STRL (Washer) IMPLANT

## 2024-01-06 NOTE — Progress Notes (Signed)
Assisted Dr. Oddono with left, supraclavicular, ultrasound guided block. Side rails up, monitors on throughout procedure. See vital signs in flow sheet. Tolerated Procedure well. 

## 2024-01-06 NOTE — Transfer of Care (Signed)
 Immediate Anesthesia Transfer of Care Note  Patient: Heidi Wallace  Procedure(s) Performed: OPEN REDUCTION INTERNAL FIXATION (ORIF) ELBOW/OLECRANON FRACTURE (Left: Elbow)  Patient Location: PACU  Anesthesia Type:General  Level of Consciousness: awake and alert   Airway & Oxygen Therapy: Patient Spontanous Breathing  Post-op Assessment: Report given to RN  Post vital signs: stable  Last Vitals:  Vitals Value Taken Time  BP 151/85 01/06/24 12:03  Temp    Pulse 103 01/06/24 12:04  Resp 16 01/06/24 12:04  SpO2 98 % 01/06/24 12:04  Vitals shown include unfiled device data.  Last Pain:  Vitals:   01/06/24 0947  PainSc: 3       Patients Stated Pain Goal: 5 (01/06/24 0947)  Complications: No notable events documented.

## 2024-01-06 NOTE — Anesthesia Procedure Notes (Addendum)
 Anesthesia Regional Block: Supraclavicular block   Pre-Anesthetic Checklist: , timeout performed,  Correct Patient, Correct Site, Correct Laterality,  Correct Procedure, Correct Position, site marked,  Risks and benefits discussed,  Surgical consent,  Pre-op evaluation,  At surgeon's request and post-op pain management  Laterality: Left  Prep: chloraprep       Needles:  Injection technique: Single-shot  Needle Type: Echogenic Stimulator Needle     Needle Length: 5cm  Needle Gauge: 22     Additional Needles:   Procedures:, nerve stimulator,,, ultrasound used (permanent image in chart),,     Nerve Stimulator or Paresthesia:  Response: HAND, 0.45 mA  Additional Responses:   Narrative:  Start time: 01/06/2024 10:15 AM End time: 01/06/2024 10:24 AM Injection made incrementally with aspirations every 5 mL.  Performed by: Personally  Anesthesiologist: Mallory Manus, MD  Additional Notes: Functioning IV was confirmed and monitors were applied.  A 50mm 22ga Arrow echogenic stimulator needle was used. Sterile prep and drape,hand hygiene and sterile gloves were used. Ultrasound guidance: relevant anatomy identified, needle position confirmed, local anesthetic spread visualized around nerve(s)., vascular puncture avoided.  Image printed for medical record. Negative aspiration and negative test dose prior to incremental administration of local anesthetic. The patient tolerated the procedure well.

## 2024-01-06 NOTE — Discharge Instructions (Addendum)
 Bonner Hair MD, MPH Aleck Stalling, PA-C [facility] [address] [phone] (tel)   [phone] (fax)   POST-OPERATIVE INSTRUCTIONS   WOUND CARE Please keep splint clean dry and intact until followup.  You may shower on Post-Op Day #3.  You must keep splint dry during this process and may find that a plastic bag taped around the extremity or alternatively a towel based bath may be a better option.   If you get your splint wet or if it is damaged please contact our clinic.  EXERCISES Due to your splint being in place you will not be able to bear weight through your extremity.    You may use a sling for comfort   It is normal for your fingers/hand to become more swollen after surgery and discolored from bruising.   This will resolve over the first few weeks usually after surgery. Please continue to ambulate and do not stay sitting or lying for too long.  Perform foot and wrist pumps to assist in circulation.   REGIONAL ANESTHESIA (NERVE BLOCKS) The anesthesia team may have performed a nerve block for you this is a great tool used to minimize pain.   The block may start wearing off overnight (between 8-24 hours postop) When the block wears off, your pain may go from nearly zero to the pain you would have had postop without the block. This is an abrupt transition but nothing dangerous is happening.   This can be a challenging period but utilize your as needed pain medications to try and manage this period. We suggest you use the pain medication the first night prior to going to bed, to ease this transition.  You may take an extra dose of narcotic when this happens if needed   POST-OP MEDICATIONS- Multimodal approach to pain control In general your pain will be controlled with a combination of substances.  Prescriptions unless otherwise discussed are electronically sent to your pharmacy.  This is a carefully made plan we use to minimize  narcotic use.      Acetaminophen  - Non-narcotic pain medicine taken on a scheduled basis  Hydrocodone -acetaminophen  (norco) - This is a strong narcotic, to be used only on an "as needed" basis for SEVERE pain.  Do not take more than 3,500 mg of Tylenol  daily  Zofran  - take as needed for nausea Bactrim  - this is an antibiotic, take as instructed   FOLLOW-UP If you develop a Fever (>101.5), Redness or Drainage from the surgical incision site, please call our office to arrange for an evaluation. Please call the office to schedule a follow-up appointment for your incision check if you do not already have one, 7-10 days post-operatively.   HELPFUL INFORMATION    You may be more comfortable sleeping in a semi-seated position the first few nights following surgery.  Keep a pillow propped under the elbow and forearm for comfort.  If you have a recliner type of chair it might be beneficial.  If not that is fine too, but it would be helpful to sleep propped up with pillows behind your operated shoulder as well under your elbow and forearm.  This will reduce pulling on the suture lines.   When dressing, put your operative arm in the sleeve first.  When getting undressed, take your operative arm out last.  Loose fitting, button-down shirts are recommended.  Often in the first days after surgery you may be more comfortable keeping your operative arm under your shirt  and not through the sleeve.   You may return to work/school in the next couple of days when you feel up to it.  Desk work and typing in the sling is fine.   We suggest you use the pain medication the first night prior to going to bed, in order to ease any pain when the anesthesia wears off. You should avoid taking pain medications on an empty stomach as it will make you nauseous.   You should wean off your narcotic medicines as soon as you are able.  Most patients will be off narcotics before their first postop appointment.    Do not drink  alcoholic beverages or take illicit drugs when taking pain medications.   It is against the law to drive while taking narcotics.  In some states it is against the law to drive while your arm is in a sling.    Pain medication may make you constipated.  Below are a few solutions to try in this order:   - Decrease the amount of pain medication if you aren't having pain.   - Drink lots of decaffeinated fluids.   - Drink prune juice and/or each dried prunes   If the first 3 don't work start with additional solutions   - Take Colace - an over-the-counter stool softener   - Take Senokot - an over-the-counter laxative   - Take Miralax  - a stronger over-the-counter laxative   For more information including helpful videos and documents visit our website:   [email or web address]   No Tylenol  until after 4:00pm today, if needed.   Post Anesthesia Home Care Instructions  Activity: Get plenty of rest for the remainder of the day. A responsible individual must stay with you for 24 hours following the procedure.  For the next 24 hours, DO NOT: -Drive a car -Advertising copywriter -Drink alcoholic beverages -Take any medication unless instructed by your physician -Make any legal decisions or sign important papers.  Meals: Start with liquid foods such as gelatin or soup. Progress to regular foods as tolerated. Avoid greasy, spicy, heavy foods. If nausea and/or vomiting occur, drink only clear liquids until the nausea and/or vomiting subsides. Call your physician if vomiting continues.  Special Instructions/Symptoms: Your throat may feel dry or sore from the anesthesia or the breathing tube placed in your throat during surgery. If this causes discomfort, gargle with warm salt water . The discomfort should disappear within 24 hours.  If you had a scopolamine patch placed behind your ear for the management of post- operative nausea and/or vomiting:  1. The medication in  the patch is effective for 72 hours, after which it should be removed.  Wrap patch in a tissue and discard in the trash. Wash hands thoroughly with soap and water . 2. You may remove the patch earlier than 72 hours if you experience unpleasant side effects which may include dry mouth, dizziness or visual disturbances. 3. Avoid touching the patch. Wash your hands with soap and water  after contact with the patch.   Information for Discharge Teaching: EXPAREL  (bupivacaine  liposome injectable suspension)   Pain relief is important to your recovery. The goal is to control your pain so you can move easier and return to your normal activities as soon as possible after your procedure. Your physician may use several types of medicines to manage pain, swelling, and more.  Your surgeon or anesthesiologist gave you EXPAREL (bupivacaine ) to help control your pain after surgery.  EXPAREL  is a local anesthetic designed to release slowly  over an extended period of time to provide pain relief by numbing the tissue around the surgical site. EXPAREL  is designed to release pain medication over time and can control pain for up to 72 hours. Depending on how you respond to EXPAREL , you may require less pain medication during your recovery. EXPAREL  can help reduce or eliminate the need for opioids during the first few days after surgery when pain relief is needed the most. EXPAREL  is not an opioid and is not addictive. It does not cause sleepiness or sedation.   Important! A teal colored band has been placed on your arm with the date, time and amount of EXPAREL  you have received. Please leave this armband in place for the full 96 hours following administration, and then you may remove the band. If you return to the hospital for any reason within 96 hours following the administration of EXPAREL , the armband provides important information that your health care providers to know, and alerts them that you have received this  anesthetic.    Possible side effects of EXPAREL : Temporary loss of sensation or ability to move in the area where medication was injected. Nausea, vomiting, constipation Rarely, numbness and tingling in your mouth or lips, lightheadedness, or anxiety may occur. Call your doctor right away if you think you may be experiencing any of these sensations, or if you have other questions regarding possible side effects.  Follow all other discharge instructions given to you by your surgeon or nurse. Eat a healthy diet and drink plenty of water  or other fluids.Regional Anesthesia Blocks  1. You may not be able to move or feel the blocked extremity after a regional anesthetic block. This may last may last from 3-48 hours after placement, but it will go away. The length of time depends on the medication injected and your individual response to the medication. As the nerves start to wake up, you may experience tingling as the movement and feeling returns to your extremity. If the numbness and inability to move your extremity has not gone away after 48 hours, please call your surgeon.   2. The extremity that is blocked will need to be protected until the numbness is gone and the strength has returned. Because you cannot feel it, you will need to take extra care to avoid injury. Because it may be weak, you may have difficulty moving it or using it. You may not know what position it is in without looking at it while the block is in effect.  3. For blocks in the legs and feet, returning to weight bearing and walking needs to be done carefully. You will need to wait until the numbness is entirely gone and the strength has returned. You should be able to move your leg and foot normally before you try and bear weight or walk. You will need someone to be with you when you first try to ensure you do not fall and possibly risk injury.  4. Bruising and tenderness at the needle site are common side effects and will resolve  in a few days.  5. Persistent numbness or new problems with movement should be communicated to the surgeon or the [facility] [id number] [facility] [phone]).

## 2024-01-06 NOTE — Anesthesia Procedure Notes (Signed)
 Procedure Name: Intubation Date/Time: 01/06/2024 10:47 AM  Performed by: Pam Macario BROCKS, CRNAPre-anesthesia Checklist: Patient identified, Emergency Drugs available, Suction available, Patient being monitored and Timeout performed Patient Re-evaluated:Patient Re-evaluated prior to induction Oxygen Delivery Method: Circle system utilized Preoxygenation: Pre-oxygenation with 100% oxygen Induction Type: IV induction Ventilation: Mask ventilation without difficulty Laryngoscope Size: Mac and 3 Grade View: Grade II Tube type: Oral Tube size: 7.0 mm Number of attempts: 1 Airway Equipment and Method: Stylet and Oral airway Placement Confirmation: ETT inserted through vocal cords under direct vision, positive ETCO2, breath sounds checked- equal and bilateral and CO2 detector Secured at: 23 cm Tube secured with: Tape (carefully around facial bruising) Dental Injury: Teeth and Oropharynx as per pre-operative assessment

## 2024-01-06 NOTE — Anesthesia Preprocedure Evaluation (Addendum)
 Anesthesia Evaluation  Patient identified by MRN, date of birth, ID band Patient awake    Reviewed: Allergy & Precautions, H&P , NPO status , Patient's Chart, lab work & pertinent test results  Airway Mallampati: II  TM Distance: >3 FB Neck ROM: Full    Dental no notable dental hx.    Pulmonary neg pulmonary ROS, former smoker   Pulmonary exam normal breath sounds clear to auscultation       Cardiovascular Exercise Tolerance: Good hypertension, + CAD, + Past MI and + Peripheral Vascular Disease  negative cardio ROS Normal cardiovascular exam Rhythm:Regular Rate:Normal     Neuro/Psych  Headaches negative neurological ROS  negative psych ROS   GI/Hepatic negative GI ROS, Neg liver ROS,GERD  ,,  Endo/Other  negative endocrine ROS    Renal/GU Renal diseasenegative Renal ROS  negative genitourinary   Musculoskeletal negative musculoskeletal ROS (+)    Abdominal   Peds negative pediatric ROS (+)  Hematology negative hematology ROS (+)   Anesthesia Other Findings   Reproductive/Obstetrics negative OB ROS                              Anesthesia Physical Anesthesia Plan  ASA: 3  Anesthesia Plan: General   Post-op Pain Management: Minimal or no pain anticipated, Regional block*, Tylenol  PO (pre-op)* and Celebrex  PO (pre-op)*   Induction: Intravenous  PONV Risk Score and Plan: 3 and Ondansetron , Dexamethasone  and TIVA  Airway Management Planned: Oral ETT and LMA  Additional Equipment: None  Intra-op Plan:   Post-operative Plan: Extubation in OR  Informed Consent: I have reviewed the patients History and Physical, chart, labs and discussed the procedure including the risks, benefits and alternatives for the proposed anesthesia with the patient or authorized representative who has indicated his/her understanding and acceptance.     Dental advisory given  Plan Discussed with:  Anesthesiologist and CRNA  Anesthesia Plan Comments: (Discussed both nerve block for pain relief post-op and GA; including NV, sore throat, dental injury, and pulmonary complications)         Anesthesia Quick Evaluation

## 2024-01-06 NOTE — Interval H&P Note (Signed)
 All questions answered, patient wants to proceed with procedure. ? ?

## 2024-01-06 NOTE — Anesthesia Postprocedure Evaluation (Signed)
 Anesthesia Post Note  Patient: Heidi Wallace  Procedure(s) Performed: OPEN REDUCTION INTERNAL FIXATION (ORIF) ELBOW/OLECRANON FRACTURE (Left: Elbow)     Patient location during evaluation: PACU Anesthesia Type: General Level of consciousness: awake and alert Pain management: pain level controlled Vital Signs Assessment: post-procedure vital signs reviewed and stable Respiratory status: spontaneous breathing, nonlabored ventilation, respiratory function stable and patient connected to nasal cannula oxygen Cardiovascular status: blood pressure returned to baseline and stable Postop Assessment: no apparent nausea or vomiting Anesthetic complications: no   No notable events documented.  Last Vitals:  Vitals:   01/06/24 1230 01/06/24 1245  BP: (!) 168/82 (!) 185/82  Pulse: 98 89  Resp: 16 16  Temp:  (!) 36.2 C  SpO2: 94% 94%    Last Pain:  Vitals:   01/06/24 1245  PainSc: 0-No pain                 Santhosh Gulino

## 2024-01-07 ENCOUNTER — Encounter (HOSPITAL_BASED_OUTPATIENT_CLINIC_OR_DEPARTMENT_OTHER): Payer: Self-pay | Admitting: Orthopaedic Surgery

## 2024-01-10 NOTE — Op Note (Signed)
 Orthopaedic Surgery Operative Note (CSN: [id number])  JESLYNN HOLLANDER  October 03, 1935 Date of Surgery: 01/06/2024   Diagnoses:  left elbow olecranon fracture  Procedure: Open reduction internal fixation olecranon fracture   Operative Finding Successful completion of the planned procedure.  Patient had poor bone quality overall and a relatively oblique fracture however was a large fragment.  Based on her skin quality I worried about a plate just below the skin and as there was no significant comminution I felt that a intramedullary screw provided better fixation.  We backed this up with a tension band figure-of-eight and sutures into the distal triceps that were passed into our tension band hole for additional fixation.  Patient will be splinted for a week and then transitioned out for range of motion and ADLs.  Post-operative plan: The patient will be nonweightbearing in a splint for a week.  The patient will be discharged home.  DVT prophylaxis not indicated in this ambulatory upper extremity patient without significant risk factors.   Pain control with PRN pain medication preferring oral medicines.  Follow up plan will be scheduled in approximately 7 days for incision check and XR.  Post-Op Diagnosis: Same Surgeons:Primary: Cristy Bonner DASEN, MD Assistants:Caroline McBane, PA-C Location: [facility] OR ROOM 6 Anesthesia: General with regional anesthesia Antibiotics: Ancef  2 g with local vancomycin  powder 1 g at the surgical site Tourniquet time:  Total Tourniquet Time Documented: Upper Arm (Left) - 40 minutes Total: Upper Arm (Left) - 40 minutes  Estimated Blood Loss: Minimal Complications: Non Specimens: None Implants: Implant Name Type Inv. Item Serial No. Manufacturer Lot No. LRB No. Used Action  WASHER CANN 12.7 STRL - [id number] Washer WASHER CANN 12.7 STRL  [employer]  Left 1 Implanted  SCREW PT [id number] - [id number] Screw SCREW PT [id number]  [employer]   Left 1 Implanted    Indications for Surgery:   CHAMAINE STANKUS is a 88 y.o. female with olecranon fracture with displacement.  Benefits and risks of operative and nonoperative management were discussed prior to surgery with patient/guardian(s) and informed consent form was completed.  Specific risks including infection, need for additional surgery, nonunion, malunion, wound breakdown amongst others   Procedure:   The patient was identified properly. Informed consent was obtained and the surgical site was marked. The patient was taken up to suite where general anesthesia was induced.  The patient was positioned lateral on a beanbag.  The left elbow was prepped and draped in the usual sterile fashion.  Timeout was performed before the beginning of the case.  Tourniquet was used for the above duration.  Began with a longitudinal incision over the posterior aspect of the olecranon.  There is a small eschar that we avoided.  With the skin sharply achieving hemostasis progressed.  We elevated full-thickness skin flaps.  We identified the olecranon fracture and cleared hematoma.  At that point I was able to perform a near anatomic reduction of there is some bone loss medially.  We were able to check a reduction on fluoroscopy.  We held the reduction with clamps.  At that point placed intramedullary guidewire using fluoroscopic imaging from [facility].  We then selected and measured and tapped 105 mm 6.5 mm cannulated screw.  We ensured it was appropriately positioned.  Before the screw was finally positioned we drilled a transverse hole in the distal aspect of the olecranon/ulna.  We passed two #2 FiberWire's through it.  One was tied in  a figure-of-eight tension band fashion over the screw head below the washer itself.  We ensured that a washer and the screw were placed beneath the triceps tendon.  The other was saved for eventual fixation of the triceps.  We passed our screw and finalized its tension.   We had good compression of the fracture in anatomic reduction.  We then passed her additional FiberWire in a running locking Krakw fashion in the distal triceps to the elbow and about 80 degrees of flexion.  We tensioned it without overtensioning to to avoid constraining the elbow.  This was backup fixation to avoid loss of fixation of the triceps.  We irrigated copiously closed incision in multilayer fashion finishing with nylon suture.  Sterile dressing and splint were placed.  Patient awoken and taken back in stable condition.  Aleck Stalling, PA-C, present and scrubbed throughout the case, critical for completion in a timely fashion, and for retraction, instrumentation, closure.

## 2024-01-13 DIAGNOSIS — H35362 Drusen (degenerative) of macula, left eye: Secondary | ICD-10-CM | POA: Diagnosis not present

## 2024-01-14 DIAGNOSIS — S52022D Displaced fracture of olecranon process without intraarticular extension of left ulna, subsequent encounter for closed fracture with routine healing: Secondary | ICD-10-CM | POA: Diagnosis not present

## 2024-01-25 DIAGNOSIS — M6281 Muscle weakness (generalized): Secondary | ICD-10-CM | POA: Diagnosis not present

## 2024-01-27 DIAGNOSIS — H04123 Dry eye syndrome of bilateral lacrimal glands: Secondary | ICD-10-CM | POA: Diagnosis not present

## 2024-01-27 DIAGNOSIS — I10 Essential (primary) hypertension: Secondary | ICD-10-CM | POA: Diagnosis not present

## 2024-01-27 DIAGNOSIS — E782 Mixed hyperlipidemia: Secondary | ICD-10-CM | POA: Diagnosis not present

## 2024-01-27 DIAGNOSIS — I2510 Atherosclerotic heart disease of native coronary artery without angina pectoris: Secondary | ICD-10-CM | POA: Diagnosis not present

## 2024-01-31 DIAGNOSIS — M6281 Muscle weakness (generalized): Secondary | ICD-10-CM | POA: Diagnosis not present

## 2024-02-07 DIAGNOSIS — M6281 Muscle weakness (generalized): Secondary | ICD-10-CM | POA: Diagnosis not present

## 2024-02-08 DIAGNOSIS — S52022D Displaced fracture of olecranon process without intraarticular extension of left ulna, subsequent encounter for closed fracture with routine healing: Secondary | ICD-10-CM | POA: Diagnosis not present

## 2024-02-08 DIAGNOSIS — M25512 Pain in left shoulder: Secondary | ICD-10-CM | POA: Diagnosis not present

## 2024-02-09 ENCOUNTER — Encounter: Payer: Self-pay | Admitting: Cardiology

## 2024-02-11 DIAGNOSIS — M6281 Muscle weakness (generalized): Secondary | ICD-10-CM | POA: Diagnosis not present

## 2024-02-14 DIAGNOSIS — M6281 Muscle weakness (generalized): Secondary | ICD-10-CM | POA: Diagnosis not present

## 2024-02-16 ENCOUNTER — Other Ambulatory Visit: Payer: Self-pay | Admitting: Nurse Practitioner

## 2024-02-18 DIAGNOSIS — M6281 Muscle weakness (generalized): Secondary | ICD-10-CM | POA: Diagnosis not present

## 2024-02-21 DIAGNOSIS — M6281 Muscle weakness (generalized): Secondary | ICD-10-CM | POA: Diagnosis not present

## 2024-02-21 DIAGNOSIS — L821 Other seborrheic keratosis: Secondary | ICD-10-CM | POA: Diagnosis not present

## 2024-02-21 DIAGNOSIS — L309 Dermatitis, unspecified: Secondary | ICD-10-CM | POA: Diagnosis not present

## 2024-02-21 DIAGNOSIS — Z85828 Personal history of other malignant neoplasm of skin: Secondary | ICD-10-CM | POA: Diagnosis not present

## 2024-02-22 DIAGNOSIS — E7849 Other hyperlipidemia: Secondary | ICD-10-CM | POA: Diagnosis not present

## 2024-02-22 DIAGNOSIS — R945 Abnormal results of liver function studies: Secondary | ICD-10-CM | POA: Diagnosis not present

## 2024-02-22 DIAGNOSIS — N189 Chronic kidney disease, unspecified: Secondary | ICD-10-CM | POA: Diagnosis not present

## 2024-02-22 DIAGNOSIS — R7301 Impaired fasting glucose: Secondary | ICD-10-CM | POA: Diagnosis not present

## 2024-02-25 DIAGNOSIS — E782 Mixed hyperlipidemia: Secondary | ICD-10-CM | POA: Diagnosis not present

## 2024-02-25 DIAGNOSIS — I10 Essential (primary) hypertension: Secondary | ICD-10-CM | POA: Diagnosis not present

## 2024-02-25 DIAGNOSIS — I2510 Atherosclerotic heart disease of native coronary artery without angina pectoris: Secondary | ICD-10-CM | POA: Diagnosis not present

## 2024-02-29 ENCOUNTER — Encounter: Payer: Self-pay | Admitting: Nurse Practitioner

## 2024-02-29 DIAGNOSIS — Z681 Body mass index (BMI) 19 or less, adult: Secondary | ICD-10-CM | POA: Diagnosis not present

## 2024-02-29 DIAGNOSIS — K219 Gastro-esophageal reflux disease without esophagitis: Secondary | ICD-10-CM | POA: Diagnosis not present

## 2024-02-29 DIAGNOSIS — I10 Essential (primary) hypertension: Secondary | ICD-10-CM | POA: Diagnosis not present

## 2024-02-29 DIAGNOSIS — I700 Atherosclerosis of aorta: Secondary | ICD-10-CM | POA: Diagnosis not present

## 2024-02-29 DIAGNOSIS — N1831 Chronic kidney disease, stage 3a: Secondary | ICD-10-CM | POA: Diagnosis not present

## 2024-03-11 DIAGNOSIS — J069 Acute upper respiratory infection, unspecified: Secondary | ICD-10-CM | POA: Diagnosis not present

## 2024-03-11 DIAGNOSIS — Z20828 Contact with and (suspected) exposure to other viral communicable diseases: Secondary | ICD-10-CM | POA: Diagnosis not present

## 2024-03-11 DIAGNOSIS — Z112 Encounter for screening for other bacterial diseases: Secondary | ICD-10-CM | POA: Diagnosis not present

## 2024-03-11 DIAGNOSIS — Z681 Body mass index (BMI) 19 or less, adult: Secondary | ICD-10-CM | POA: Diagnosis not present

## 2024-03-11 DIAGNOSIS — J029 Acute pharyngitis, unspecified: Secondary | ICD-10-CM | POA: Diagnosis not present

## 2024-03-13 DIAGNOSIS — Z681 Body mass index (BMI) 19 or less, adult: Secondary | ICD-10-CM | POA: Diagnosis not present

## 2024-03-13 DIAGNOSIS — U071 COVID-19: Secondary | ICD-10-CM | POA: Diagnosis not present

## 2024-03-19 ENCOUNTER — Ambulatory Visit: Payer: Self-pay | Admitting: Nurse Practitioner

## 2024-03-22 DIAGNOSIS — Z23 Encounter for immunization: Secondary | ICD-10-CM | POA: Diagnosis not present

## 2024-03-28 DIAGNOSIS — I2510 Atherosclerotic heart disease of native coronary artery without angina pectoris: Secondary | ICD-10-CM | POA: Diagnosis not present

## 2024-03-28 DIAGNOSIS — E782 Mixed hyperlipidemia: Secondary | ICD-10-CM | POA: Diagnosis not present

## 2024-03-28 DIAGNOSIS — I10 Essential (primary) hypertension: Secondary | ICD-10-CM | POA: Diagnosis not present

## 2024-04-11 DIAGNOSIS — S52022D Displaced fracture of olecranon process without intraarticular extension of left ulna, subsequent encounter for closed fracture with routine healing: Secondary | ICD-10-CM | POA: Diagnosis not present

## 2024-04-12 ENCOUNTER — Encounter (INDEPENDENT_AMBULATORY_CARE_PROVIDER_SITE_OTHER): Payer: Self-pay | Admitting: Gastroenterology

## 2024-04-24 DIAGNOSIS — L570 Actinic keratosis: Secondary | ICD-10-CM | POA: Diagnosis not present

## 2024-04-26 ENCOUNTER — Telehealth: Payer: Self-pay | Admitting: Pharmacy Technician

## 2024-04-26 ENCOUNTER — Ambulatory Visit: Attending: Cardiology | Admitting: Cardiology

## 2024-04-26 ENCOUNTER — Other Ambulatory Visit (HOSPITAL_BASED_OUTPATIENT_CLINIC_OR_DEPARTMENT_OTHER): Payer: Self-pay

## 2024-04-26 ENCOUNTER — Encounter: Payer: Self-pay | Admitting: Cardiology

## 2024-04-26 VITALS — BP 138/72 | HR 79 | Ht 63.0 in | Wt 109.8 lb

## 2024-04-26 DIAGNOSIS — I7121 Aneurysm of the ascending aorta, without rupture: Secondary | ICD-10-CM | POA: Diagnosis not present

## 2024-04-26 DIAGNOSIS — T466X5D Adverse effect of antihyperlipidemic and antiarteriosclerotic drugs, subsequent encounter: Secondary | ICD-10-CM | POA: Diagnosis not present

## 2024-04-26 DIAGNOSIS — M791 Myalgia, unspecified site: Secondary | ICD-10-CM | POA: Diagnosis not present

## 2024-04-26 DIAGNOSIS — I25119 Atherosclerotic heart disease of native coronary artery with unspecified angina pectoris: Secondary | ICD-10-CM

## 2024-04-26 DIAGNOSIS — I739 Peripheral vascular disease, unspecified: Secondary | ICD-10-CM

## 2024-04-26 DIAGNOSIS — E782 Mixed hyperlipidemia: Secondary | ICD-10-CM

## 2024-04-26 DIAGNOSIS — I493 Ventricular premature depolarization: Secondary | ICD-10-CM

## 2024-04-26 MED ORDER — NEXLIZET 180-10 MG PO TABS
1.0000 | ORAL_TABLET | Freq: Every day | ORAL | 6 refills | Status: AC
  Filled 2024-04-26 – 2024-04-27 (×2): qty 30, 30d supply, fill #0
  Filled 2024-06-15 – 2024-06-27 (×2): qty 30, 30d supply, fill #1

## 2024-04-26 MED ORDER — ISOSORBIDE MONONITRATE ER 30 MG PO TB24
30.0000 mg | ORAL_TABLET | Freq: Two times a day (BID) | ORAL | 3 refills | Status: DC
  Filled 2024-04-26: qty 180, 90d supply, fill #0

## 2024-04-26 NOTE — Progress Notes (Signed)
 Cardiology Office Note  Date: 04/26/2024   ID: Heidi Wallace, DOB 05-18-1936, MRN [id number]  History of Present Illness: Heidi Wallace is an 88 y.o. female last seen in April.  She is here with her husband for a follow-up visit.  Does report intermittent thoracic discomfort, somewhat difficult to differentiate among reflux, arthritic costal discomfort, and angina.  She will sometimes take extra strength Tylenol , gabapentin , and then nitroglycerin  for the symptoms.  No palpitations, no dizziness or syncope.  We went over her medications.  She did talk with the lipid clinic about possibility of PCSK9 inhibitors, her feeling is not to pursue this therapy however.  We talked about possibility of switching from Zetia  to Nexlizet and otherwise continuing low-dose Crestor  as tolerated.  I reviewed her most recent lipid panel.  We also discussed a trial of increased dose Imdur .  She is scheduled for a follow-up chest CTA in December.  Ascending aortic dilatation of 41 mm was noted as of December 2024.  We did talk about this today.  If there is no substantial progression, we may follow this more conservatively over time.  I reviewed her ECG today which shows sinus rhythm with R' in leads V1 and V2.  Physical Exam: VS:  BP 138/72   Pulse 79   Ht 5' 3 (1.6 m)   Wt 109 lb 12.8 oz (49.8 kg)   SpO2 98%   BMI 19.45 kg/m , BMI Body mass index is 19.45 kg/m.  Wt Readings from Last 3 Encounters:  04/26/24 109 lb 12.8 oz (49.8 kg)  01/06/24 109 lb 9.1 oz (49.7 kg)  11/12/23 109 lb 11.2 oz (49.8 kg)    General: Patient appears comfortable at rest. HEENT: Conjunctiva and lids normal. Neck: Supple, no elevated JVP or carotid bruits. Lungs: Clear to auscultation, nonlabored breathing at rest. Cardiac: Regular rate and rhythm, no S3, 1/6 systolic murmur, no pericardial rub. Extremities: No pitting edema.  ECG:  An ECG dated 06/02/2023 was personally reviewed today and demonstrated:  Sinus  rhythm with borderline low voltage, R'  in lead V1 and V2.  Labwork: 06/02/2023: ALT 13; AST 20 06/03/2023: BUN 12; Creatinine, Ser 0.75; Hemoglobin 12.5; Magnesium  2.0; Platelets 228; Potassium 3.8; Sodium 138     Component Value Date/Time   CHOL 170 02/20/2023 0350   TRIG 62 02/20/2023 0350   HDL 70 02/20/2023 0350   CHOLHDL 2.4 02/20/2023 0350   VLDL 12 02/20/2023 0350   LDLCALC 88 02/20/2023 0350  August 2025: Potassium 4.3, BUN 18, creatinine 0.92, GFR 60, AST 24, ALT 17, cholesterol 196, triglycerides 68, LDL 93, HDL 89  Other Studies Reviewed Today:  No interval cardiac testing for review today.  Assessment and Plan:  1.  CAD status post DES to the proximal LAD in August 2024 with medically managed OM2 disease (small caliber), presented with unstable angina at that time.  Echocardiogram in December 2024 revealed normal LVEF at 60 to 65% without regional wall motion abnormalities.  She completed course of Plavix  in August.  Has had intermittent thoracic discomfort as discussed above.  Plan to increase Imdur  to 30 mg twice daily, otherwise continue aspirin  81 mg daily and statin therapy.   2.  Mixed hyperlipidemia with statin intolerance due to myalgias. Maximum tolerated Crestor  dose is 5 mg daily and she is also on Zetia  10 mg daily.  LDL 93 and HDL 89 in August.  She would prefer to hold off on PCSK9 inhibitors.  Plan to switch  from Zetia  to Nexlizet 180/10 mg daily.   3.  Asymptomatic, ascending aortic dilatation of 41 mm by chest CTA in December 2024.  This will be reimaged in December of this year.  If relatively stable, we will likely follow this more conservatively going forward.   4.  Primary hypertension.  No changes to current regimen including Toprol -XL 50 mg daily.   5.  History of PAD status post previous right popliteal stent intervention.  ABIs normal in March 2024.  She had a visit with [facility] in May and continues to do well.  Disposition:  Follow up 6  months.  Signed, Jayson JUDITHANN Sierras, M.D., F.A.C.C. [facility]

## 2024-04-26 NOTE — Telephone Encounter (Signed)
   Pharmacy Patient Advocate Encounter   Received notification from CoverMyMeds that prior authorization for nexlizet is required/requested.   Insurance verification completed.   The patient is insured through [employer].   Per test claim: PA required; PA submitted to above mentioned insurance via Latent Key/confirmation #/EOC [id number] Status is pending

## 2024-04-26 NOTE — Telephone Encounter (Signed)
 Pharmacy Patient Advocate Encounter  Received notification from [employer] that Prior Authorization for nexlizet has been APPROVED from 04/26/24 to 06/28/25   PA #/Case ID/Reference #: [id number]

## 2024-04-26 NOTE — Patient Instructions (Addendum)
 Medication Instructions:  Your physician has recommended you make the following change in your medication:  Change isosorbide  mononitrate to 30 mg twice daily Stop zetia  Start Nexlizet 180/10 mg daily-please pick up your free prescription from [facility] Continue all other medications as prescribed  Labwork: none  Testing/Procedures: none  Follow-Up: Your physician recommends that you schedule a follow-up appointment in: 6 months  Any Other Special Instructions Will Be Listed Below (If Applicable).  If you need a refill on your cardiac medications before your next appointment, please call your pharmacy.

## 2024-04-27 ENCOUNTER — Other Ambulatory Visit (HOSPITAL_BASED_OUTPATIENT_CLINIC_OR_DEPARTMENT_OTHER): Payer: Self-pay

## 2024-04-27 ENCOUNTER — Other Ambulatory Visit: Payer: Self-pay

## 2024-04-28 ENCOUNTER — Other Ambulatory Visit (HOSPITAL_BASED_OUTPATIENT_CLINIC_OR_DEPARTMENT_OTHER): Payer: Self-pay

## 2024-04-28 DIAGNOSIS — I2510 Atherosclerotic heart disease of native coronary artery without angina pectoris: Secondary | ICD-10-CM | POA: Diagnosis not present

## 2024-04-28 DIAGNOSIS — E782 Mixed hyperlipidemia: Secondary | ICD-10-CM | POA: Diagnosis not present

## 2024-04-28 DIAGNOSIS — I10 Essential (primary) hypertension: Secondary | ICD-10-CM | POA: Diagnosis not present

## 2024-05-26 DIAGNOSIS — I10 Essential (primary) hypertension: Secondary | ICD-10-CM | POA: Diagnosis not present

## 2024-05-26 DIAGNOSIS — E782 Mixed hyperlipidemia: Secondary | ICD-10-CM | POA: Diagnosis not present

## 2024-05-26 DIAGNOSIS — I2510 Atherosclerotic heart disease of native coronary artery without angina pectoris: Secondary | ICD-10-CM | POA: Diagnosis not present

## 2024-05-28 ENCOUNTER — Encounter: Payer: Self-pay | Admitting: Cardiology

## 2024-05-29 ENCOUNTER — Telehealth: Payer: Self-pay

## 2024-05-29 NOTE — Telephone Encounter (Signed)
 Coronary CT canceled per pt's request.

## 2024-05-29 NOTE — Telephone Encounter (Signed)
 Patient requested cancellation of CT.

## 2024-06-02 ENCOUNTER — Ambulatory Visit (HOSPITAL_COMMUNITY)

## 2024-06-16 ENCOUNTER — Other Ambulatory Visit: Payer: Self-pay | Admitting: Cardiology

## 2024-06-16 MED ORDER — ISOSORBIDE MONONITRATE ER 30 MG PO TB24: 30.0000 mg | ORAL_TABLET | Freq: Two times a day (BID) | ORAL | 3 refills | Status: AC

## 2024-06-26 ENCOUNTER — Other Ambulatory Visit (HOSPITAL_BASED_OUTPATIENT_CLINIC_OR_DEPARTMENT_OTHER): Payer: Self-pay

## 2024-06-27 ENCOUNTER — Other Ambulatory Visit (HOSPITAL_BASED_OUTPATIENT_CLINIC_OR_DEPARTMENT_OTHER): Payer: Self-pay

## 2024-11-14 ENCOUNTER — Encounter

## 2024-11-14 ENCOUNTER — Ambulatory Visit
